# Patient Record
Sex: Female | Born: 1970
Health system: Southern US, Community
[De-identification: ages and names within clinical notes are randomized; demographics above are authoritative.]

## PROBLEM LIST (undated history)

## (undated) DIAGNOSIS — F419 Anxiety disorder, unspecified: Secondary | ICD-10-CM

## (undated) DIAGNOSIS — I10 Essential (primary) hypertension: Secondary | ICD-10-CM

## (undated) DIAGNOSIS — F329 Major depressive disorder, single episode, unspecified: Secondary | ICD-10-CM

## (undated) DIAGNOSIS — Z973 Presence of spectacles and contact lenses: Secondary | ICD-10-CM

## (undated) DIAGNOSIS — E785 Hyperlipidemia, unspecified: Secondary | ICD-10-CM

## (undated) DIAGNOSIS — K219 Gastro-esophageal reflux disease without esophagitis: Secondary | ICD-10-CM

## (undated) DIAGNOSIS — E119 Type 2 diabetes mellitus without complications: Secondary | ICD-10-CM

## (undated) DIAGNOSIS — J45909 Unspecified asthma, uncomplicated: Secondary | ICD-10-CM

## (undated) DIAGNOSIS — F32A Depression, unspecified: Secondary | ICD-10-CM

## (undated) DIAGNOSIS — J189 Pneumonia, unspecified organism: Secondary | ICD-10-CM

## (undated) DIAGNOSIS — E213 Hyperparathyroidism, unspecified: Secondary | ICD-10-CM

## (undated) HISTORY — PX: UMBILICAL HERNIA REPAIR: SHX196

## (undated) HISTORY — PX: CERVICAL CONE BIOPSY: SUR198

## (undated) HISTORY — DX: Major depressive disorder, single episode, unspecified: F32.9

## (undated) HISTORY — DX: Essential (primary) hypertension: I10

## (undated) HISTORY — DX: Anxiety disorder, unspecified: F41.9

## (undated) HISTORY — DX: Depression, unspecified: F32.A

## (undated) HISTORY — PX: TUBAL LIGATION: SHX77

## (undated) HISTORY — DX: Hyperlipidemia, unspecified: E78.5

## (undated) HISTORY — PX: WISDOM TOOTH EXTRACTION: SHX21

## (undated) HISTORY — PX: HERNIA REPAIR: SHX51

---

## 1997-12-01 HISTORY — PX: PARATHYROIDECTOMY: SHX19

## 1998-03-13 ENCOUNTER — Ambulatory Visit (HOSPITAL_COMMUNITY): Admission: RE | Admit: 1998-03-13 | Discharge: 1998-03-13 | Payer: Self-pay | Admitting: Surgery

## 1999-12-24 ENCOUNTER — Other Ambulatory Visit: Admission: RE | Admit: 1999-12-24 | Discharge: 1999-12-24 | Payer: Self-pay | Admitting: Gynecology

## 2000-01-24 ENCOUNTER — Other Ambulatory Visit: Admission: RE | Admit: 2000-01-24 | Discharge: 2000-01-24 | Payer: Self-pay | Admitting: Gynecology

## 2000-02-17 ENCOUNTER — Other Ambulatory Visit: Admission: RE | Admit: 2000-02-17 | Discharge: 2000-02-17 | Payer: Self-pay | Admitting: Gynecology

## 2000-08-07 ENCOUNTER — Encounter: Admission: RE | Admit: 2000-08-07 | Discharge: 2000-08-07 | Payer: Self-pay | Admitting: Family Medicine

## 2000-09-09 ENCOUNTER — Encounter: Admission: RE | Admit: 2000-09-09 | Discharge: 2000-09-09 | Payer: Self-pay | Admitting: Sports Medicine

## 2000-11-04 ENCOUNTER — Encounter: Admission: RE | Admit: 2000-11-04 | Discharge: 2000-11-04 | Payer: Self-pay | Admitting: Family Medicine

## 2000-12-23 ENCOUNTER — Other Ambulatory Visit: Admission: RE | Admit: 2000-12-23 | Discharge: 2000-12-23 | Payer: Self-pay | Admitting: Gynecology

## 2001-06-30 ENCOUNTER — Other Ambulatory Visit: Admission: RE | Admit: 2001-06-30 | Discharge: 2001-06-30 | Payer: Self-pay | Admitting: Gynecology

## 2001-07-27 ENCOUNTER — Other Ambulatory Visit: Admission: RE | Admit: 2001-07-27 | Discharge: 2001-07-27 | Payer: Self-pay | Admitting: Gynecology

## 2001-08-25 ENCOUNTER — Encounter: Admission: RE | Admit: 2001-08-25 | Discharge: 2001-08-25 | Payer: Self-pay | Admitting: Family Medicine

## 2001-09-08 ENCOUNTER — Encounter: Admission: RE | Admit: 2001-09-08 | Discharge: 2001-09-08 | Payer: Self-pay | Admitting: Family Medicine

## 2001-09-10 ENCOUNTER — Ambulatory Visit (HOSPITAL_COMMUNITY): Admission: RE | Admit: 2001-09-10 | Discharge: 2001-09-10 | Payer: Self-pay | Admitting: Obstetrics and Gynecology

## 2001-09-10 ENCOUNTER — Encounter (INDEPENDENT_AMBULATORY_CARE_PROVIDER_SITE_OTHER): Payer: Self-pay | Admitting: *Deleted

## 2001-09-15 ENCOUNTER — Encounter: Admission: RE | Admit: 2001-09-15 | Discharge: 2001-09-15 | Payer: Self-pay | Admitting: Family Medicine

## 2001-10-27 ENCOUNTER — Encounter: Admission: RE | Admit: 2001-10-27 | Discharge: 2001-10-27 | Payer: Self-pay | Admitting: Family Medicine

## 2001-11-03 ENCOUNTER — Encounter: Admission: RE | Admit: 2001-11-03 | Discharge: 2001-11-03 | Payer: Self-pay | Admitting: Family Medicine

## 2002-01-26 ENCOUNTER — Other Ambulatory Visit: Admission: RE | Admit: 2002-01-26 | Discharge: 2002-01-26 | Payer: Self-pay | Admitting: Obstetrics and Gynecology

## 2002-09-29 ENCOUNTER — Other Ambulatory Visit: Admission: RE | Admit: 2002-09-29 | Discharge: 2002-09-29 | Payer: Self-pay | Admitting: Obstetrics and Gynecology

## 2003-04-18 ENCOUNTER — Other Ambulatory Visit: Admission: RE | Admit: 2003-04-18 | Discharge: 2003-04-18 | Payer: Self-pay | Admitting: Obstetrics and Gynecology

## 2003-05-15 ENCOUNTER — Other Ambulatory Visit: Admission: RE | Admit: 2003-05-15 | Discharge: 2003-05-15 | Payer: Self-pay | Admitting: Obstetrics and Gynecology

## 2003-08-11 ENCOUNTER — Inpatient Hospital Stay (HOSPITAL_COMMUNITY): Admission: AD | Admit: 2003-08-11 | Discharge: 2003-08-12 | Payer: Self-pay | Admitting: Obstetrics and Gynecology

## 2003-12-05 ENCOUNTER — Inpatient Hospital Stay (HOSPITAL_COMMUNITY): Admission: AD | Admit: 2003-12-05 | Discharge: 2003-12-09 | Payer: Self-pay | Admitting: Obstetrics and Gynecology

## 2003-12-06 ENCOUNTER — Encounter (INDEPENDENT_AMBULATORY_CARE_PROVIDER_SITE_OTHER): Payer: Self-pay | Admitting: Specialist

## 2003-12-11 ENCOUNTER — Encounter: Admission: RE | Admit: 2003-12-11 | Discharge: 2004-01-10 | Payer: Self-pay | Admitting: Obstetrics and Gynecology

## 2004-01-11 ENCOUNTER — Encounter: Admission: RE | Admit: 2004-01-11 | Discharge: 2004-02-10 | Payer: Self-pay | Admitting: Obstetrics and Gynecology

## 2004-01-15 ENCOUNTER — Other Ambulatory Visit: Admission: RE | Admit: 2004-01-15 | Discharge: 2004-01-15 | Payer: Self-pay | Admitting: Obstetrics and Gynecology

## 2006-02-09 ENCOUNTER — Encounter: Admission: RE | Admit: 2006-02-09 | Discharge: 2006-02-09 | Payer: Self-pay | Admitting: Family Medicine

## 2006-08-24 ENCOUNTER — Ambulatory Visit: Payer: Self-pay | Admitting: Family Medicine

## 2006-09-02 ENCOUNTER — Ambulatory Visit: Payer: Self-pay | Admitting: Family Medicine

## 2007-03-18 DIAGNOSIS — F329 Major depressive disorder, single episode, unspecified: Secondary | ICD-10-CM

## 2007-03-18 DIAGNOSIS — E785 Hyperlipidemia, unspecified: Secondary | ICD-10-CM | POA: Insufficient documentation

## 2007-03-18 DIAGNOSIS — I10 Essential (primary) hypertension: Secondary | ICD-10-CM

## 2007-07-29 ENCOUNTER — Telehealth (INDEPENDENT_AMBULATORY_CARE_PROVIDER_SITE_OTHER): Payer: Self-pay | Admitting: *Deleted

## 2007-07-30 ENCOUNTER — Ambulatory Visit: Payer: Self-pay | Admitting: Family Medicine

## 2007-08-03 ENCOUNTER — Telehealth (INDEPENDENT_AMBULATORY_CARE_PROVIDER_SITE_OTHER): Payer: Self-pay | Admitting: *Deleted

## 2007-08-06 ENCOUNTER — Ambulatory Visit: Payer: Self-pay | Admitting: Family Medicine

## 2007-09-27 ENCOUNTER — Ambulatory Visit: Payer: Self-pay | Admitting: Family Medicine

## 2007-09-27 DIAGNOSIS — F5102 Adjustment insomnia: Secondary | ICD-10-CM

## 2007-09-27 DIAGNOSIS — F411 Generalized anxiety disorder: Secondary | ICD-10-CM

## 2007-11-02 ENCOUNTER — Ambulatory Visit: Payer: Self-pay | Admitting: Family Medicine

## 2007-11-02 DIAGNOSIS — J069 Acute upper respiratory infection, unspecified: Secondary | ICD-10-CM | POA: Insufficient documentation

## 2007-11-02 LAB — CONVERTED CEMR LAB
ALT: 13 units/L (ref 0–35)
AST: 22 units/L (ref 0–37)
Cholesterol: 157 mg/dL (ref 0–200)
Creatinine, Ser: 0.7 mg/dL (ref 0.4–1.2)
GFR calc Af Amer: 123 mL/min
GFR calc non Af Amer: 102 mL/min
LDL Cholesterol: 88 mg/dL (ref 0–99)
Sodium: 139 meq/L (ref 135–145)

## 2007-11-03 ENCOUNTER — Telehealth (INDEPENDENT_AMBULATORY_CARE_PROVIDER_SITE_OTHER): Payer: Self-pay | Admitting: *Deleted

## 2007-11-04 ENCOUNTER — Telehealth (INDEPENDENT_AMBULATORY_CARE_PROVIDER_SITE_OTHER): Payer: Self-pay | Admitting: *Deleted

## 2008-01-03 ENCOUNTER — Ambulatory Visit: Payer: Self-pay | Admitting: Family Medicine

## 2008-02-02 ENCOUNTER — Ambulatory Visit: Payer: Self-pay | Admitting: Family Medicine

## 2008-02-02 DIAGNOSIS — L600 Ingrowing nail: Secondary | ICD-10-CM | POA: Insufficient documentation

## 2008-02-07 ENCOUNTER — Encounter (INDEPENDENT_AMBULATORY_CARE_PROVIDER_SITE_OTHER): Payer: Self-pay | Admitting: *Deleted

## 2008-02-14 ENCOUNTER — Encounter: Payer: Self-pay | Admitting: Internal Medicine

## 2008-03-23 ENCOUNTER — Ambulatory Visit: Payer: Self-pay | Admitting: Family Medicine

## 2008-06-10 ENCOUNTER — Inpatient Hospital Stay (HOSPITAL_COMMUNITY): Admission: AD | Admit: 2008-06-10 | Discharge: 2008-06-10 | Payer: Self-pay | Admitting: *Deleted

## 2008-06-16 ENCOUNTER — Observation Stay (HOSPITAL_COMMUNITY): Admission: AD | Admit: 2008-06-16 | Discharge: 2008-06-16 | Payer: Self-pay | Admitting: Obstetrics and Gynecology

## 2008-12-07 ENCOUNTER — Inpatient Hospital Stay (HOSPITAL_COMMUNITY): Admission: AD | Admit: 2008-12-07 | Discharge: 2008-12-07 | Payer: Self-pay | Admitting: Obstetrics and Gynecology

## 2009-01-13 ENCOUNTER — Inpatient Hospital Stay (HOSPITAL_COMMUNITY): Admission: AD | Admit: 2009-01-13 | Discharge: 2009-01-16 | Payer: Self-pay | Admitting: Obstetrics

## 2009-01-13 ENCOUNTER — Encounter (INDEPENDENT_AMBULATORY_CARE_PROVIDER_SITE_OTHER): Payer: Self-pay | Admitting: Obstetrics and Gynecology

## 2009-01-17 ENCOUNTER — Encounter: Admission: RE | Admit: 2009-01-17 | Discharge: 2009-02-08 | Payer: Self-pay | Admitting: Obstetrics and Gynecology

## 2009-02-21 ENCOUNTER — Telehealth (INDEPENDENT_AMBULATORY_CARE_PROVIDER_SITE_OTHER): Payer: Self-pay | Admitting: *Deleted

## 2010-12-31 NOTE — Letter (Signed)
Summary: [facility]   Imported By: Velora Heckler 02/17/2008 09:51:45  _____________________________________________________________________  External Attachment:    Type:   Image     Comment:   External Document

## 2011-03-17 LAB — COMPREHENSIVE METABOLIC PANEL
ALT: 12 U/L (ref 0–35)
AST: 18 U/L (ref 0–37)
Albumin: 2.6 g/dL — ABNORMAL LOW (ref 3.5–5.2)
BUN: 4 mg/dL — ABNORMAL LOW (ref 6–23)
CO2: 22 mEq/L (ref 19–32)
Calcium: 8.6 mg/dL (ref 8.4–10.5)
Creatinine, Ser: 0.37 mg/dL — ABNORMAL LOW (ref 0.4–1.2)
GFR calc non Af Amer: >60 mL/min (ref >60)
Potassium: 3.3 mEq/L — ABNORMAL LOW (ref 3.5–5.1)
Total Protein: 5.6 g/dL — ABNORMAL LOW (ref 6.0–8.3)

## 2011-03-17 LAB — URINALYSIS, ROUTINE W REFLEX MICROSCOPIC
Ketones, ur: NEGATIVE mg/dL
Nitrite: NEGATIVE
Protein, ur: NEGATIVE mg/dL
Urobilinogen, UA: 0.2 mg/dL (ref 0.0–1.0)

## 2011-03-17 LAB — CBC
HCT: 34.5 % — ABNORMAL LOW (ref 36.0–46.0)
Hemoglobin: 11.9 g/dL — ABNORMAL LOW (ref 12.0–15.0)
MCHC: 34.5 g/dL (ref 30.0–36.0)
MCV: 90.1 fL (ref 78.0–100.0)
RBC: 3.83 MIL/uL — ABNORMAL LOW (ref 3.87–5.11)
RDW: 12.5 % (ref 11.5–15.5)
WBC: 9.5 10*3/uL (ref 4.0–10.5)

## 2011-03-18 LAB — CBC
HCT: 31.4 % — ABNORMAL LOW (ref 36.0–46.0)
HCT: 37.8 % (ref 36.0–46.0)
Hemoglobin: 11 g/dL — ABNORMAL LOW (ref 12.0–15.0)
Hemoglobin: 13 g/dL (ref 12.0–15.0)
MCHC: 34.5 g/dL (ref 30.0–36.0)
MCHC: 34.9 g/dL (ref 30.0–36.0)
MCV: 90.7 fL (ref 78.0–100.0)
Platelets: 238 10*3/uL (ref 150–400)
RBC: 4.17 MIL/uL (ref 3.87–5.11)
RDW: 13 % (ref 11.5–15.5)
RDW: 13.1 % (ref 11.5–15.5)
WBC: 10.8 10*3/uL — ABNORMAL HIGH (ref 4.0–10.5)
WBC: 12 10*3/uL — ABNORMAL HIGH (ref 4.0–10.5)

## 2011-03-18 LAB — URINE MICROSCOPIC-ADD ON

## 2011-03-18 LAB — COMPREHENSIVE METABOLIC PANEL
ALT: 14 U/L (ref 0–35)
AST: 22 U/L (ref 0–37)
Albumin: 2.7 g/dL — ABNORMAL LOW (ref 3.5–5.2)
Alkaline Phosphatase: 91 U/L (ref 39–117)
BUN: 6 mg/dL (ref 6–23)
CO2: 20 mEq/L (ref 19–32)
Calcium: 9 mg/dL (ref 8.4–10.5)
Chloride: 106 mEq/L (ref 96–112)
Creatinine, Ser: 0.35 mg/dL — ABNORMAL LOW (ref 0.4–1.2)
GFR calc Af Amer: >60 mL/min (ref >60)
GFR calc non Af Amer: >60 mL/min (ref >60)
Glucose, Bld: 93 mg/dL (ref 70–99)
Potassium: 3.3 mEq/L — ABNORMAL LOW (ref 3.5–5.1)
Sodium: 135 mEq/L (ref 135–145)
Total Bilirubin: 0.5 mg/dL (ref 0.3–1.2)
Total Protein: 6.1 g/dL (ref 6.0–8.3)

## 2011-03-18 LAB — URINALYSIS, ROUTINE W REFLEX MICROSCOPIC
Bilirubin Urine: NEGATIVE
Glucose, UA: NEGATIVE mg/dL
Hgb urine dipstick: NEGATIVE
Ketones, ur: 15 mg/dL — AB
Nitrite: NEGATIVE
Protein, ur: NEGATIVE mg/dL
Specific Gravity, Urine: 1.015 (ref 1.005–1.030)
Urobilinogen, UA: 0.2 mg/dL (ref 0.0–1.0)
pH: 6 (ref 5.0–8.0)

## 2011-03-18 LAB — URIC ACID: Uric Acid, Serum: 4.3 mg/dL (ref 2.4–7.0)

## 2011-03-18 LAB — LACTATE DEHYDROGENASE: LDH: 120 U/L (ref 94–250)

## 2011-03-18 LAB — RPR: RPR Ser Ql: NONREACTIVE

## 2011-04-15 NOTE — Op Note (Signed)
NAME:  Stacy, Hull            ACCOUNT NO.:  [id number]   MEDICAL RECORD NO.:  [id number]          PATIENT TYPE:  INP   LOCATION:  8850                          FACILITY:  [facility]   PHYSICIAN:  Servando Salina, M.D.DATE OF BIRTH:  1971/07/21   DATE OF PROCEDURE:  01/13/2009  DATE OF DISCHARGE:                               OPERATIVE REPORT   PREOPERATIVE DIAGNOSES:  1. Early labor.  2. Previous cesarean section.  3. Term gestation.  4. Desires sterilization.   PROCEDURES:  1. Repeat cesarean section Kerr hysterotomy.  2. Modified Pomeroy tubal ligation.   POSTOPERATIVE DIAGNOSES:  1. Desires sterilization.  2. Previous cesarean section.  3. Term gestation, delivered  4. Early labor.   ANESTHESIA:  Spinal.   SURGEON:  Servando Salina, MD   ASSISTANT:  Artelia Laroche, CNM   PROCEDURE:  Under adequate spinal anesthesia, the patient was placed in  the supine position with a left lateral tilt.  She was sterilely prepped  and draped in the usual fashion.  Indwelling Foley catheter was  sterilely placed.  A 0.25% Marcaine was injected along the previous  Pfannenstiel skin incision.  Pfannenstiel skin incision was then made,  carried down to the rectus fascia.  Rectus fascia was opened  transversely.  The rectus fascia was then bluntly and sharply dissected  off the rectus muscle in superior and inferior fashion.  The parietal  peritoneum was opened bluntly.  At that time, it was then noted there  was some bladder adhesions as well as an omental adhesion to the  anterior abdominal wall.  Omental adhesion was in a loop and this was  lysed off the left aspect of the anterior abdominal wall followed by  lysis of the adhesions of the bladder, peritoneum to the uterus.  Once  that was performed, curvilinear low transverse uterine incision was then  made and extended with bandage scissors.  It was then noted that there  was meconium-stained amniotic sac.  The sac was then  ruptured.  Subsequent delivery of a live female from the left occiput transverse  position was accomplished.  Baby was bulb suctioned on the abdomen.  Cord was clamped and cut, the baby was transferred to the awaiting  pediatrician who assigned Apgars of 9 at 9 and 1 at 5 minutes.  The  placenta which was anterior, was spontaneous intact.  Uterine cavity was  cleaned of debris.  Uterine incision had no extension, it was closed in  two layers, 0 Monocryl for running locked stitch first layer, second  layer 0 Monocryl imbricating sutures.  Small bleeders were cauterized.  Attention was then turned to the fallopian tubes and ovaries.  Both  tubes and ovaries were normal bilaterally.  The midportion of both  fallopian tubes were grasped with a Babcock.  The underlying mesosalpinx  was opened with cautery.  The proximal and distal portion of both  segment of tube bilaterally was tied with 0 chromic suture x2 proximally  and distally, and intermediate section of fallopian tube was then  removed bilaterally.  The abdomen was then copiously irrigated and  suctioned debris.  Small  bleeders again cauterized and good hemostasis  was then noted.  The parietal peritoneum was closed with 2-0 Vicryl.  The rectus fascia was closed with 0 Vicryl x2.  The subcutaneous areas  irrigated, small bleeders were cauterized.  Interrupted 2-0 plain  sutures placed in the subcutaneous area.  The skin approximated using  Ethicon staples.   SPECIMENS:  Placenta was not sent.  Portion of right and left fallopian  tube sent to Pathology.   ESTIMATED BLOOD LOSS:  800 mL.   INTRAOPERATIVE FLUID:  2100 mL.   URINE OUTPUT:  300 mL of clear yellow urine.   Sponge and instrument counts x2 was correct.  Complications none.  Weight of the baby was 7 pounds 6 ounces.  The patient tolerated the  procedure well and was transferred to recovery room in stable condition.      Servando Salina, M.D.  Electronically  Signed     [place]/MEDQ  D:  01/14/2009  T:  01/14/2009  Job:  [id number]

## 2011-04-15 NOTE — H&P (Signed)
NAME:  Stacy Hull, Stacy Hull            ACCOUNT NO.:  [id number]   MEDICAL RECORD NO.:  [id number]          PATIENT TYPE:  MAT   LOCATION:  MATC                          FACILITY:  [facility]   PHYSICIAN:  Lovenia Kim, M.D.DATE OF BIRTH:  1971/06/22   DATE OF ADMISSION:  12/07/2008  DATE OF DISCHARGE:  12/07/2008                              HISTORY & PHYSICAL   CHIEF COMPLAINT:  Elevated blood pressure.   She is a 40 year old white female G2, P1 and history of hypertension,  history of preeclampsia with her first pregnancy, presenting now at 45  plus weeks' gestation with reportedly a wrist cuff blood pressure workup  180/113 with occasional dizziness, but no headache, visual symptoms, or  epigastric pain.  She had a previous history of preeclampsia during her  first pregnancy.  She is currently taking Aldomet.  She has allergies to  ERYTHROMYCIN.  She has a personal history of cone biopsy, history of  HPV, history of parathyroidectomy, hypertension, hyperlipidemia, C-  section.  She is a nonsmoker, nondrinker.  She denies domestic or  physical violence.  She is a Education officer, museum.   Medications include Zofran, Lexapro, and methyldopa 250 mg twice a day.   She has a family history of diabetes, heart disease, hypertension,  cervical cancer, leukemia, stroke, and diabetes.   Prenatal course as noted.   PHYSICAL EXAMINATION:  GENERAL:  She is a well-developed, well-nourished  white female in no acute distress.  VITAL SIGNS:  Blood pressures ranging 110-130 over 60-80 since  admission.  HEENT:  Normal.  LUNGS:  Clear.  HEART:  Regular rhythm.  ABDOMEN:  Soft, gravid, nontender.  No right upper quadrant tenderness,  no CVA tenderness.  DTRs are 2 to 3+.  No evidence of clonus with trace  pretibial edema noted.  PELVIC:  Deferred.  NEUROLOGIC:  Nonfocal.   NST is reactive.  No current tractions noted.  Urinalysis is negative.  CBC is normal and CMP is pending.   IMPRESSION:  1. A  32-week OB.  2. History of hypertension with questionable exacerbation.  No      evidence of preeclampsia, normal blood pressures upon observation      here at [place]:  Discharge home pending lab work.  Continue Aldomet.  Follow up in  the office as scheduled.  Pre-eclamptic warnings given.      Lovenia Kim, M.D.  Electronically Signed     RJT/MEDQ  D:  12/07/2008  T:  12/08/2008  Job:  [id number]

## 2011-04-18 NOTE — Op Note (Signed)
[facility]  Patient:    Stacy Hull, Stacy Hull Visit Number: [id number] MRN: [id number]          Service Type: DSU Location: [facility] Attending Physician:  Servando Salina Dictated by:   Bobette Mo. Garwin Brothers, M.D. Proc. Date: 09/10/01 Admit Date:  09/10/2001                             Operative Report  PREOPERATIVE DIAGNOSIS:       Recurrent AGUS Pap smears.  POSTOPERATIVE DIAGNOSIS:      Recurrent AGUS Pap smears pending final                               pathology.  OPERATION:                    1. Colposcopy.                               2. Cold knife conization.                               3. Endocervical curetting.  SURGEON:                      Sheronette A. Garwin Brothers, M.D.  ANESTHESIA:                   MAC; paracervical block.  ESTIMATED BLOOD LOSS:         Minimal.  SPECIMENS:                    Cone biopsy and endocervical curettings.  COMPLICATIONS:                None.  INDICATIONS:                  This is a 40 year old G93, married white female on continuous birth control pills who presents for cold knife conization secondary to recurrent atypical glandular cells on her Pap smear with a negative colposcopy and endocervical curetting. The patient was previously followed by another gynecologist in town who had performed a colposcopy a year previously for a AGUS Pap smear and did not find a correlation. The patient was followed with Pap smears and then had recurrence of her AGUS Pap again. The patient sought attention elsewhere with regards to the management of her care. The risks and benefits of the planned conization was reviewed with the patient, specifically distortion of the cervical anatomy, cervical incompetence, cervical stenosis, bleeding, infection, possible need for the same procedure to be reperformed due to additional involvement extending to the margins of her specimen. Consent was signed and the patient  was transferred to the operating room.  DESCRIPTION OF PROCEDURE:            Under adequate monitored anesthesia, the patient was placed in the dorsal lithotomy position. She was externally prepped and draped in the usual sterile fashion. The patients bladder was catheterized. A bivalve speculum was placed in the vagina and 21 cc of 1% Nesacaine was injected paracervically at 3:00 and 9:00. #0 Vicryl figure-of-eight sutures were placed at 3:00 and 9:00 to incorporate the cervical branch of the uterine vessels. 4% acetic acid was placed on the cervix in  the vagina and a colposcopy was performed. No acetowhite epithelium, abnormal vessels, punctations or mosaic was seen. The cervix was then circumferentially injected with 1% lidocaine with epinephrine. Lugols solution was placed. A scalpel was then used to outline the periphery of the cone and Mayo scissors was then used to remove the base of the cone specimen. Endocervical curetting was then performed. The base was fulgurated. Monsels solution was applied and Surgicel placed in the cone bed area. The specimen was tagged at 12:00. Good hemostasis was noted. The sutures which had been placed at 3:00 and 9:00 were then tied down further and then all instruments removed from the vagina.  Specimens were cone biopsy and endocervical curettings. Estimated blood loss was minimal. Complications none. The patient tolerated the procedure well and was transferred to the recovery room in stable condition. Dictated by:   Bobette Mo. Garwin Brothers, M.D. Attending Physician:  Servando Salina DD:  09/10/01 TD:  09/11/01 Job: [id number]

## 2011-04-18 NOTE — Op Note (Signed)
NAME:  Stacy Hull, Stacy Hull                      ACCOUNT NO.:  [id number]   MEDICAL RECORD NO.:  [id number]                   PATIENT TYPE:  INP   LOCATION:  9125                                 FACILITY:  [facility]   PHYSICIAN:  Servando Salina, M.D.            DATE OF BIRTH:  [birth year]   DATE OF PROCEDURE:  12/06/2003  DATE OF DISCHARGE:                                 OPERATIVE REPORT   PREOPERATIVE DIAGNOSES:  1. Arrest of descent.  2. Intrauterine gestation at 38 weeks.  3. Chronic hypertension.   PROCEDURE:  Primary cesarean section, low transverse uterine incision.   POSTOPERATIVE DIAGNOSES:  1. Arrest of descent in direct occiput posterior presentation.  2. Intrauterine gestation of 38 weeks.  3. Chronic hypertension.   ANESTHESIA:  Epidural.   SURGEON:  Servando Salina, M.D.   INDICATIONS:  This is a 40 year old gravida 1, para 0 female at term, with  worsening chronic hypertension; admitted on December 05, 2003 for induction of  labor.  Her induction was started with Pitocin augmentation as well as  artificial rupture of membranes.  Light meconium was noted at the time.  An  intrauterine pressure catheter was placed and amnioinfusion started.  The  patient progressed to full dilatation and pushed for two hours; however, on  examination she had vertex that was wedged in the pelvis and at 0 station.  Prior examination had confirmed a right occiput posterior presentation.  Given the findings, the patient was given the option of proceeding with a  primary cesarean section versus continuing to push for an additional time.  The patient opted for primary cesarean section. The risks and benefit of the  procedure was reviewed.  Consent had been signed.  The patient was  transferred to the operating room.   DESCRIPTION OF PROCEDURE:  Under adequate epidural anesthesia, the patient  was placed in the supine position with the left lateral tilt.  She was  sterilely prepped and  draped in the usual fashion, and an indwelling Foley  catheter was already in place.  Internal scalp electrode was utilized for  fetal heart monitoring, and was subsequently removed prior to prepping the  patient.  A marking pen was used to outline the planned Pfannenstiel skin  incision.  Then 6 cc of 0.25% Marcaine was injected along the planned  incision line.  The Pfannenstiel skin incision was then made and carried  down to the rectus fascia, using scalpel.  The rectus fascia was incised in  the midline and extended bilaterally using Bovie cautery.  The rectus fascia  was bluntly and with cautery dissected off the rectus muscles superiorly and  inferiorly.  The rectus muscles split in the midline.  The parietoperitoneum  was developed.  The bladder was then bluntly dissected off the lower uterine  segment and displaced inferiorly using a bladder retractor.  A curvilinear  low transverse uterine incision was then made, and extended bilaterally  using bandage  scissors.  On extension of the incision there was a loop of  cord noted on the right aspect of the baby.  Subsequent delivery of a live  female from the direct occiput posterior presentation wedged in the pelvis was  accomplished without difficulty.  He was bulb suctioned in the abdomen. Cord  was clamped and cut.  The baby was transferred to the awaiting  pediatricians.  Apgars of 8 and 9 at one and five minutes.  The placenta,  which was cornual state and was anterior and manually removed.  The uterine  cavity was cleaned of debris, with no intracavitary lesions noted.  Uterine  incision had no extension.  The uterine incision was then closed in two  layers; the first layer is a running locked stitch of 0 Monocryl, the second  layer is embrocating using 0 Monocryl suture.  Bleeding along about 1.5 inch  area in the mid aspect of the incision resulted in 0 Monocryl being oversewn  in that area; good hemostasis subsequently noted.   Additional small bleeders  were then cauterized.  The abdomen was copiously irrigated and suctioned of  debris.  Normal tubes and ovaries are noted bilaterally.  The  parietoperitoneum was now closed.  The rectus fascia was inspected and small  bleeders cauterized.  The rectus fascia was then closed with 0 Vicryl x2.  The subcutaneous area was irrigated, suctioned and cauterized of small  bleeders.  The skin was approximated using Ethicon staples.   SPECIMENS:  Placenta sent to pathology.   ESTIMATED BLOOD LOSS:  500 cc.   INTRAOPERATIVE FLUID:  1800 cc Crystalloid.   URINE OUTPUT:  200 cc clear yellow urine.   COMPLICATIONS:  None.   DISPOSITION:  Sponge and instrument count x2 was correct.  The patient  tolerated the procedure well and was transferred to the recovery room in  stable condition.                                               Servando Salina, M.D.    [place]/MEDQ  D:  12/06/2003  T:  12/06/2003  Job:  [id number]

## 2011-04-18 NOTE — Discharge Summary (Signed)
NAME:  Stacy Hull, Stacy Hull            ACCOUNT NO.:  [id number]   MEDICAL RECORD NO.:  [id number]          PATIENT TYPE:  INP   LOCATION:  3013                          FACILITY:  [facility]   PHYSICIAN:  Servando Salina, M.D.DATE OF BIRTH:  Jun 23, 1971   DATE OF ADMISSION:  01/13/2009  DATE OF DISCHARGE:  01/16/2009                               DISCHARGE SUMMARY   ADMISSION DIAGNOSES:  Early labor, term gestation,  previous cesarean  section, desires sterilization, chronic hypertension.   DISCHARGE DIAGNOSES:  Term gestation, delivered, previous cesarean  section, early labor, desires sterilization, chronic hypertension.   PROCEDURE:  Repeat cesarean section, modified Pomeroy tubal ligation.   HISTORY OF PRESENT ILLNESS:  A 40 year old gravida 2, para 1-0-0-1  female at 68 weeks with chronic hypertension on Aldomet, controlled at a  previous cesarean section, scheduled for repeat C-section and who  presented in early labor.  The patient declined attempt of vaginal  delivery.  Her prenatal course has been notable for group B strep that  was positive.  The patient had abnormal 1-hour glucose tolerance test,  but a normal 3-hour testing.   HOSPITAL COURSE:  The patient was evaluated in maternity admissions.  At  that time of her presentation, blood pressure was notable to 152/94,  161/112, 149/99.  Her exam was notable for 1+ edema.  Deep tendon  reflexes were 2+.  Her cervical exam was 3, 90 and -2 with a bulging bag  of water.  Her tracing was reactive.  [place] labs were obtained that were  normal.  The patient was taken to the operating room to undergo her  repeat cesarean section as planned.  She reiterated the desire for  sterilization.  The patient underwent surgery, live female was born.  Apgars of 9 and 9, 7 pounds 6 ounces, omental adhesions that were  present and bladder adhesions were lysed.  Normal tubes and ovaries.  Midportion of both fallopian tubes were removed and confirmed  by  Pathology.  Her postoperative course was unremarkable.  She was  continued on her Aldomet.  Her CBC on postop day #1 showed a hemoglobin  of 11, hematocrit 31.4, white count of 12.0, platelet count 20,000.  The  patient did not require any additional blood pressure medications.  She  was also continued on her Lexapro for depression, which has been  controlled.  By postop day #3, she was tolerating a regular diet.  Incision had no evidence of infection.  She was deemed well to go home.  Disposition is home.  Condition stable.   DISCHARGE MEDICATIONS:  1. Percocet 5/325 mg 1-2 tablets every 4-6 hours p.r.n. pain.  2. Lexapro 10 mg p.o. daily.  3. Aldomet 250 mg p.o. b.i.d.  4. Motrin 100 mg 1 every 8 hours p.r.n. pain.  5. Prenatal vitamins 1 p.o. daily.   DISCHARGE INSTRUCTIONS:  Per the postpartum booklet given.  Followup  appointment at [facility] on January 19, 2009, for blood pressure  check and staple removal and 6 weeks for a postpartum appointment.      Servando Salina, M.D.  Electronically Signed  [place]/MEDQ  D:  02/10/2009  T:  02/11/2009  Job:  [id number]

## 2011-04-18 NOTE — Discharge Summary (Signed)
NAME:  LYRIS, HITCHMAN                      ACCOUNT NO.:  [id number]   MEDICAL RECORD NO.:  [id number]                   PATIENT TYPE:  INP   LOCATION:  9125                                 FACILITY:  [facility]   PHYSICIAN:  Servando Salina, M.D.            DATE OF BIRTH:  10-12-1971   DATE OF ADMISSION:  12/05/2003  DATE OF DISCHARGE:  12/09/2003                                 DISCHARGE SUMMARY   ADMISSION DIAGNOSES:  1. Chronic hypertension.  2. Intrauterine gestation at 38 weeks.   DISCHARGE DIAGNOSES:  1. Term gestation, delivered.  2. Chronic hypertension.  3. Arrest of descent.  4. Status post a primary cesarean section.   PROCEDURE:  Primary cesarean section.   HISTORY OF PRESENT ILLNESS:  A 40 year old gravida 1 para 0 female at 37+  weeks gestation admitted for induction of labor secondary to elevated blood  pressures.  Her labs had not shown superimposed preeclampsia.   HOSPITAL COURSE:  The patient was admitted to [place]  induction was started.  Artificial rupture of membranes was performed on  December 05, 2003.  Light meconium was noted.  [place] labs had been obtained and  were normal.  Amnioinfusion was started.  The patient subsequently  progressed to full dilatation, pushed for 2 hours without much descent  beyond 0 station with a wedged-in vertex.  Given the findings the patient  opted to proceed with a primary cesarean section rather than continue to  push.  The patient underwent a primary cesarean section on December 06, 2003,  live female, 7 pounds 5 ounces, direct occiput posterior presentation, Apgars  of 8 and 9.  Normal tubes and ovaries were noted.  Cord was adjacent to the  left upper body of the baby.  The patient had an uncomplicated postoperative  course.  She did not require any blood pressure medication during her  hospitalization.  She was tolerating a regular diet and passing flatus, and  on postoperative day #3 after remaining  without any evidence of infection,  she was deemed well to be discharged home.  Her CBC on postoperative day #1  showed a hemoglobin 11.1, white count 12.6, hematocrit 31.2.   DISPOSITION:  Home.   CONDITION:  Stable.   DISCHARGE MEDICATIONS:  1. Percocet one to two tablets q.3-4h. p.r.n. pain.  2. Motrin 800 mg one p.o. q.6-8h. p.r.n. pain.  3. Prenatal vitamins one p.o. daily.   FOLLOW-UP:  Follow-up appointment at [facility] in 4 weeks for  postpartum care.   DISCHARGE INSTRUCTIONS:  1. Call for temperature greater than or equal to 100.4.  2. Nothing per vagina for 4-6 weeks.  3. No heavy lifting or driving for 2 weeks.  4. Call for [facility] warning signs, soaking a regular pad every hour or more     frequently, severe abdominal pain, nausea or vomiting, increased incision     pain, redness or drainage from the incision site.  Servando Salina, M.D.    [place]/MEDQ  D:  01/19/2004  T:  01/20/2004  Job:  [id number]

## 2011-04-18 NOTE — H&P (Signed)
NAME:  Stacy Hull, Stacy Hull                      ACCOUNT NO.:  [id number]   MEDICAL RECORD NO.:  [id number]                   PATIENT TYPE:  MAT   LOCATION:  MATC                                 FACILITY:  [place] [facility]   PHYSICIAN:  Lovenia Kim, M.D.             DATE OF BIRTH:  Jun 25, 1971   DATE OF ADMISSION:  12/05/2003  DATE OF DISCHARGE:                                HISTORY & PHYSICAL   CHIEF COMPLAINT:  Elevated blood pressure.   HISTORY OF PRESENT ILLNESS:  The patient is a 40 year old white female, G1,  P0, EDD of December 22, 2003, at 37+ weeks, who presents with elevated blood  pressures and office visit for induction.   ALLERGIES:  ERYTHROMYCIN.   MEDICATIONS:  Albuterol p.r.n. and prenatal vitamins.   MEDICAL HISTORY:  1. Remarkable for abnormal Pap smear with cone biopsy in 2002.  2. History of HPV.  3. History of asthma.  4. History of parathyroidectomy in 1999.  5. History of UTI.   FAMILY HISTORY:  Manic depression, leukemia, diabetes, heart disease, and  myocardial infarction.   PRENATAL LABORATORY DATA:  Blood type O positive, rubella immune, hepatitis  negative, HIV nonreactive.   PRENATAL COURSE:  Complicated by elevation of blood pressure, being followed  since the late second, early third trimester.   PHYSICAL EXAMINATION:  GENERAL:  She is a well-developed, well-nourished,  white female in no acute distress.  HEENT:  Normal.  LUNGS:  Clear.  HEART:  Regular rhythm.  ABDOMEN:  Soft, gravid, nontender.  Estimated fetal weight 8 pounds.  CERVICAL:  Per Dr. Garwin Brothers, 1 cm, 70% vertex, and -1.  EXTREMITIES:  DTRs 3+.  No evidence of clonus, with trace pretibial edema  noted.  NEUROLOGIC:  Nonfocal.   PIH labs reveal normal liver function tests with creatinine of 0.6, LDH 131,  uric acid 3.6, negative proteinuria on urinalysis and a normal CBC with a  platelet count of 281.  Fetal heart rate is reactive.  Irregular  contractions are noted.   IMPRESSION:   Gestational hypertension at term.  No evidence of preeclampsia.   PLAN:  1. Proceed with Pitocin induction.  2. Watch blood pressure closely.  3. Need for magnesium to be assessed intrapartum and postpartum.                                               Lovenia Kim, M.D.    RJT/MEDQ  D:  12/05/2003  T:  12/05/2003  Job:  [id number]

## 2011-08-28 LAB — URINALYSIS, ROUTINE W REFLEX MICROSCOPIC
Glucose, UA: NEGATIVE
Hgb urine dipstick: NEGATIVE
Hgb urine dipstick: NEGATIVE
Protein, ur: NEGATIVE
Specific Gravity, Urine: 1.01
Specific Gravity, Urine: 1.025
Urobilinogen, UA: 0.2

## 2011-08-29 LAB — URINALYSIS, ROUTINE W REFLEX MICROSCOPIC
Bilirubin Urine: NEGATIVE
Ketones, ur: 15 — AB
Nitrite: NEGATIVE
Protein, ur: NEGATIVE
Urobilinogen, UA: 0.2

## 2011-08-29 LAB — HEPATITIS PANEL, ACUTE
HCV Ab: NEGATIVE
Hep A IgM: NEGATIVE
Hep B C IgM: NEGATIVE
Hepatitis B Surface Ag: NEGATIVE

## 2011-08-29 LAB — TSH: TSH: 0.526

## 2012-01-20 ENCOUNTER — Other Ambulatory Visit: Payer: Self-pay | Admitting: Physician Assistant

## 2012-01-20 ENCOUNTER — Other Ambulatory Visit: Payer: Self-pay | Admitting: Family Medicine

## 2012-01-21 NOTE — Progress Notes (Unsigned)
Called in alpralolam 0.5 Rx into [facility] [address] Subjective:    Patient ID: Stacy Hull, female    DOB: 01/10/1971, 41 y.o.   MRN: [id number]  HPI    Review of Systems     Objective:   Physical Exam        Assessment & Plan:

## 2012-01-21 NOTE — Telephone Encounter (Signed)
I meant to ask that you phone this is, but forgot to make the change, so it printed.  Please call in the alprazolam as ordered, and shred the printed copy.  Thanks. csj

## 2012-01-21 NOTE — Telephone Encounter (Signed)
Rx called in 

## 2012-02-03 ENCOUNTER — Ambulatory Visit (INDEPENDENT_AMBULATORY_CARE_PROVIDER_SITE_OTHER): Payer: BC Managed Care – PPO | Admitting: Physician Assistant

## 2012-02-03 ENCOUNTER — Other Ambulatory Visit: Payer: Self-pay | Admitting: Physician Assistant

## 2012-02-03 VITALS — BP 114/76 | HR 78 | Temp 98.4°F | Resp 16 | Ht 65.25 in | Wt 193.0 lb

## 2012-02-03 DIAGNOSIS — J3489 Other specified disorders of nose and nasal sinuses: Secondary | ICD-10-CM

## 2012-02-03 DIAGNOSIS — R0981 Nasal congestion: Secondary | ICD-10-CM

## 2012-02-03 DIAGNOSIS — I10 Essential (primary) hypertension: Secondary | ICD-10-CM

## 2012-02-03 DIAGNOSIS — J029 Acute pharyngitis, unspecified: Secondary | ICD-10-CM

## 2012-02-03 DIAGNOSIS — R05 Cough: Secondary | ICD-10-CM

## 2012-02-03 MED ORDER — IPRATROPIUM BROMIDE 0.06 % NA SOLN: 2.0000 | Freq: Three times a day (TID) | NASAL | Status: DC

## 2012-02-03 MED ORDER — BENZONATATE 100 MG PO CAPS: 100.0000 mg | ORAL_CAPSULE | Freq: Three times a day (TID) | ORAL | Status: AC | PRN

## 2012-02-03 MED ORDER — LISINOPRIL-HYDROCHLOROTHIAZIDE 20-12.5 MG PO TABS: 1.0000 | ORAL_TABLET | Freq: Every day | ORAL | Status: DC

## 2012-02-03 MED ORDER — CEFDINIR 300 MG PO CAPS: 300.0000 mg | ORAL_CAPSULE | Freq: Two times a day (BID) | ORAL | Status: AC

## 2012-02-03 NOTE — Progress Notes (Signed)
Patient ID: Stacy Hull MRN: [id number], DOB: 01/24/1971, 41 y.o. Date of Encounter: 02/03/2012, 1:50 PM  Primary Physician: Garnet Koyanagi, DO, DO  Chief Complaint:  Chief Complaint  Patient presents with  . Sore Throat    yesterday - exposed to strep throat  . Cough  . Headache    HPI: 41 y.o. year old female presents with 2 day history of sore throat, cough, and sinus headache. Subjective fever and chills. Mild myalgias. Has been exposed to two co-workers with diagnosed strep pharyngitis. Her sore throat is her worst symptom. Hurts to swallow, but able to. Very mild congestion and cough. When she does cough it is non-productive.   She also requests a refill of her lisinopril/HCTZ 20/12.5 mg. She has an appointment with Dr. Carlota Raspberry 02/13/12 for routine follow up and will be out of her medication before then. She is doing well with her current dose. No side effects.  No leg trauma, sedentary periods, h/o cancer, or tobacco use.  Past Medical History  Diagnosis Date  . Hyperlipidemia   . HTN (hypertension)   . Anxiety   . Allergic rhinitis      Home Meds: Prior to Admission medications   Medication Sig Start Date End Date Taking? Authorizing Provider  ALPRAZolam Duanne Moron) 0.5 MG tablet TAKE 1 TABLET BY MOUTH EVERY DAY 01/20/12  Yes Chelle Jeffery, PA-C  citalopram (CELEXA) 20 MG tablet TAKE ONE TABLET BY MOUTH DAILY 01/20/12  Yes Windell Hummingbird, PA-C  lisinopril-hydrochlorothiazide (PRINZIDE,ZESTORETIC) 20-12.5 MG per tablet Take 1 tablet by mouth daily.   Yes Historical Provider, MD    Allergies:  Allergies  Allergen Reactions  . Erythromycin     History   Social History  . Marital Status: Married    Spouse Name: N/A    Number of Children: N/A  . Years of Education: N/A   Occupational History  . Not on file.   Social History Main Topics  . Smoking status: Never Smoker   . Smokeless tobacco: Not on file  . Alcohol Use: Not on file  . Drug Use: Not on file  .  Sexually Active: Not on file   Other Topics Concern  . Not on file   Social History Narrative  . No narrative on file     Review of Systems: Constitutional: negative for chills, fever, night sweats or weight changes Cardiovascular: negative for chest pain or palpitations Respiratory: negative for hemoptysis, wheezing, or shortness of breath Abdominal: negative for abdominal pain, nausea, vomiting or diarrhea Dermatological: negative for rash Neurologic: negative for headache   Physical Exam: Blood pressure 114/76, pulse 78, temperature 98.4 F (36.9 C), temperature source Oral, resp. rate 16, height 5' 5.25" (1.657 m), weight 193 lb (87.544 kg), last menstrual period 01/27/2012., Body mass index is 31.87 kg/(m^2). General: Well developed, well nourished, in no acute distress. Head: Normocephalic, atraumatic, eyes without discharge, sclera non-icteric, nares are congested. Bilateral auditory canals clear, TM's are without perforation, pearly grey with reflective cone of light bilaterally. Serous effusion bilaterally behind TM's. Frontal sinus TTP bilaterally. Oral cavity moist, dentition normal. Posterior pharynx with post nasal drip and erythema. Mild exudate along left tonsil. No peritonsillar abscess. Neck: Supple. No thyromegaly. Full ROM. < 2 cm AC. Lungs: Clear bilaterally to auscultation without wheezes, rales, or rhonchi. Breathing is unlabored. Heart: RRR with S1 S2. No murmurs, rubs, or gallops appreciated. Msk:  Strength and tone normal for age. Extremities: No clubbing or cyanosis. No edema. Neuro: Alert and oriented  X 3. Moves all extremities spontaneously. CNII-XII grossly in tact. Psych:  Responds to questions appropriately with a normal affect.   Labs: Results for orders placed in visit on 02/03/12  POCT RAPID STREP A (OFFICE)      Component Value Range   Rapid Strep A Screen Negative  Negative    TC pending  ASSESSMENT AND PLAN:  41 y.o. year old female with  pharyngitis with known exposure to strep and hypertension. 1. Pharyngitis -Cover for strep pharyngitis -Omnicef 300 mg 1 po bid #20 no RF  -Atrovent NS 0.06% 2 sprays each nare bid prn #1 no RF -Tessalon Perles 100 mg 1 po tid prn cough #30 no RF  -New toothbrush  -Mucinex -Tylenol/Motrin prn -Rest/fluids -RTC precautions -RTC 3-5 days if no improvement  2. Hypertension -Refill Lisinopril/HCTZ 20/12.5 mg #30 1 po daily RF 1 -Has appointment with Dr. Carlota Raspberry 02/13/12 for routine follow up  Signed, Christell Faith, PA-C 02/03/2012 1:50 PM

## 2012-02-04 NOTE — Progress Notes (Signed)
Quick Note:  Await final results.  Christell Faith, PA-C 02/04/2012 7:22 PM ______

## 2012-02-05 LAB — CULTURE, GROUP A STREP: Organism ID, Bacteria: NORMAL

## 2012-02-13 ENCOUNTER — Ambulatory Visit: Payer: Self-pay | Admitting: Family Medicine

## 2012-02-24 ENCOUNTER — Other Ambulatory Visit: Payer: Self-pay | Admitting: Physician Assistant

## 2012-03-16 ENCOUNTER — Other Ambulatory Visit: Payer: Self-pay | Admitting: Physician Assistant

## 2012-04-30 ENCOUNTER — Other Ambulatory Visit: Payer: Self-pay | Admitting: Physician Assistant

## 2012-06-11 ENCOUNTER — Other Ambulatory Visit: Payer: Self-pay | Admitting: Physician Assistant

## 2012-06-15 ENCOUNTER — Other Ambulatory Visit: Payer: Self-pay | Admitting: Family Medicine

## 2012-06-15 MED ORDER — CITALOPRAM HYDROBROMIDE 20 MG PO TABS: 20.0000 mg | ORAL_TABLET | Freq: Every day | ORAL | Status: DC

## 2012-06-22 ENCOUNTER — Other Ambulatory Visit: Payer: Self-pay | Admitting: Physician Assistant

## 2012-07-21 ENCOUNTER — Other Ambulatory Visit: Payer: Self-pay | Admitting: Physician Assistant

## 2012-07-21 NOTE — Telephone Encounter (Signed)
#  15, 0 refill-- needs OV

## 2012-07-21 NOTE — Telephone Encounter (Signed)
Needs office visit.

## 2012-08-29 ENCOUNTER — Other Ambulatory Visit: Payer: Self-pay | Admitting: Physician Assistant

## 2012-09-23 ENCOUNTER — Other Ambulatory Visit: Payer: Self-pay | Admitting: Physician Assistant

## 2012-10-01 ENCOUNTER — Ambulatory Visit (INDEPENDENT_AMBULATORY_CARE_PROVIDER_SITE_OTHER): Payer: BC Managed Care – PPO | Admitting: Family Medicine

## 2012-10-01 ENCOUNTER — Encounter: Payer: Self-pay | Admitting: Family Medicine

## 2012-10-01 VITALS — BP 122/96 | HR 71 | Temp 98.4°F | Resp 16 | Ht 66.0 in | Wt 192.4 lb

## 2012-10-01 DIAGNOSIS — Z79899 Other long term (current) drug therapy: Secondary | ICD-10-CM

## 2012-10-01 DIAGNOSIS — F411 Generalized anxiety disorder: Secondary | ICD-10-CM

## 2012-10-01 DIAGNOSIS — I10 Essential (primary) hypertension: Secondary | ICD-10-CM

## 2012-10-01 MED ORDER — BENZONATATE 100 MG PO CAPS: 200.0000 mg | ORAL_CAPSULE | Freq: Three times a day (TID) | ORAL | Status: DC | PRN

## 2012-10-01 MED ORDER — CITALOPRAM HYDROBROMIDE 20 MG PO TABS: 20.0000 mg | ORAL_TABLET | Freq: Every day | ORAL | Status: DC

## 2012-10-01 MED ORDER — ALPRAZOLAM 0.5 MG PO TABS: 0.5000 mg | ORAL_TABLET | Freq: Every day | ORAL | Status: DC | PRN

## 2012-10-01 MED ORDER — LISINOPRIL-HYDROCHLOROTHIAZIDE 20-12.5 MG PO TABS: 1.0000 | ORAL_TABLET | Freq: Every day | ORAL | Status: DC

## 2012-10-02 LAB — BASIC METABOLIC PANEL
Chloride: 104 mEq/L (ref 96–112)
Creat: 0.5 mg/dL (ref 0.50–1.10)

## 2012-10-04 NOTE — Progress Notes (Signed)
  Subjective:    Patient ID: Stacy Hull, female    DOB: 08-Sep-1971, 41 y.o.   MRN: [id number]  HPI  Pt here for refill on her celexa and xanax.  Has a sig FHx of bipolar d/o which has been very stressful for her - her mother had a suicide attempt last Christmas.  She has been doing very well on celexa 20 and feels like she is in a much better place now but not ready to go off. Has been on antidepressants for about 8 yrs and celexa for about 4.  Uses prn xanax rarely - less than 1/2 the days.    She was diagnosed w/ bronchitis and covered w/ augmentin at a [facility] sev wks ago.  Is feeling better but still coughing and now w/ some stress incontinence so requests refill on tessalon pearles to use while at work.  Also needs refill on bp med.  Past Medical History  Diagnosis Date  . Hyperlipidemia   . HTN (hypertension)   . Anxiety   . 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  Review of Systems  Constitutional: Positive for fatigue. Negative for fever, chills, diaphoresis and appetite change.  Eyes: Negative for visual disturbance.  Respiratory: Positive for cough. Negative for shortness of breath.   Cardiovascular: Negative for chest pain, palpitations and leg swelling.  Genitourinary: Negative for decreased urine volume.  Neurological: Negative for syncope and headaches.  Hematological: Does not bruise/bleed easily.  Psychiatric/Behavioral: Positive for sleep disturbance and dysphoric mood.      BP 122/96  Pulse 71  Temp 98.4 F (36.9 C) (Oral)  Resp 16  Ht 5\' 6"  (1.676 m)  Wt 192 lb 6.4 oz (87.272 kg)  BMI 31.05 kg/m2  SpO2 97% Objective:   Physical Exam  Constitutional: She is oriented to person, place, and time. She appears well-developed and well-nourished. No distress.  HENT:  Head: Normocephalic and atraumatic.  Right Ear: External ear normal.  Left Ear: External ear normal.  Eyes: Conjunctivae normal are normal. No  scleral icterus.  Neck: Normal range of motion. Neck supple. No thyromegaly present.  Cardiovascular: Normal rate, regular rhythm, normal heart sounds and intact distal pulses.   Pulmonary/Chest: Effort normal and breath sounds normal. No respiratory distress.  Musculoskeletal: She exhibits no edema.  Lymphadenopathy:    She has no cervical adenopathy.  Neurological: She is alert and oriented to person, place, and time.  Skin: Skin is warm and dry. She is not diaphoretic. No erythema.  Psychiatric: She has a normal mood and affect. Her behavior is normal.          Assessment & Plan:   1. Encounter for long-term (current) use of other medications  Basic metabolic panel   2. HTN -  refill lisinopril/hctz. 3. Depression - cont citalopram w/ prn xanax. Pt will consider trying to wean off citalopram this summer - will see how she is doing. 4. Acute bronchitis - recovering, refill tessalon.

## 2012-11-17 ENCOUNTER — Other Ambulatory Visit: Payer: Self-pay | Admitting: Obstetrics and Gynecology

## 2012-11-20 ENCOUNTER — Encounter (HOSPITAL_COMMUNITY): Payer: Self-pay | Admitting: Pharmacist

## 2012-11-25 ENCOUNTER — Encounter (HOSPITAL_COMMUNITY): Payer: Self-pay

## 2012-11-25 ENCOUNTER — Encounter (HOSPITAL_COMMUNITY)
Admission: RE | Admit: 2012-11-25 | Discharge: 2012-11-25 | Disposition: A | Discharge status: Home / Self Care | Payer: BC Managed Care – PPO | Source: Ambulatory Visit | Attending: Obstetrics and Gynecology | Admitting: Obstetrics and Gynecology

## 2012-11-25 LAB — BASIC METABOLIC PANEL
CO2: 22 mEq/L (ref 19–32)
Calcium: 9.5 mg/dL (ref 8.4–10.5)
Creatinine, Ser: 0.55 mg/dL (ref 0.50–1.10)
GFR calc Af Amer: >90 mL/min (ref >90)
Sodium: 134 mEq/L — ABNORMAL LOW (ref 135–145)

## 2012-11-25 LAB — CBC
MCH: 32.4 pg (ref 26.0–34.0)
Platelets: 283 10*3/uL (ref 150–400)
RBC: 4.85 MIL/uL (ref 3.87–5.11)
RDW: 12.2 % (ref 11.5–15.5)
WBC: 8.5 10*3/uL (ref 4.0–10.5)

## 2012-11-25 NOTE — Patient Instructions (Addendum)
20 Stacy Hull  11/25/2012   Your procedure is scheduled on:  11/26/12  Enter through the Main Entrance of [facility] at 9 AM.  Pick up the phone at the desk and dial [phone].   Call this number if you have problems the morning of surgery: [phone]   Remember:   Do not eat food:After Midnight.  Do not drink clear liquids: After Midnight.  Take these medicines the morning of surgery with A SIP OF WATER: Celexa, Blood Pressure medication   Do not wear jewelry, make-up or nail polish.  Do not wear lotions, powders, or perfumes. You may wear deodorant.  Do not shave 48 hours prior to surgery.  Do not bring valuables to the hospital.  Contacts, dentures or bridgework may not be worn into surgery.  Leave suitcase in the car. After surgery it may be brought to your room.  For patients admitted to the hospital, checkout time is 11:00 AM the day of discharge.   Patients discharged the day of surgery will not be allowed to drive home.  Name and phone number of your driver: husband   [name]  Special Instructions: Shower using CHG 2 nights before surgery and the night before surgery.  If you shower the day of surgery use CHG.  Use special wash - you have one bottle of CHG for all showers.  You should use approximately 1/3 of the bottle for each shower.   Please read over the following fact sheets that you were given: Surgical Site Infection Prevention

## 2012-11-26 ENCOUNTER — Ambulatory Visit (HOSPITAL_COMMUNITY): Payer: BC Managed Care – PPO | Admitting: Anesthesiology

## 2012-11-26 ENCOUNTER — Encounter (HOSPITAL_COMMUNITY)
Admission: RE | Disposition: A | Discharge status: Home / Self Care | Payer: Self-pay | Source: Ambulatory Visit | Attending: Obstetrics and Gynecology

## 2012-11-26 ENCOUNTER — Encounter (HOSPITAL_COMMUNITY): Payer: Self-pay | Admitting: Anesthesiology

## 2012-11-26 ENCOUNTER — Ambulatory Visit (HOSPITAL_COMMUNITY)
Admission: RE | Admit: 2012-11-26 | Discharge: 2012-11-26 | Disposition: A | Discharge status: Home / Self Care | Payer: BC Managed Care – PPO | Source: Ambulatory Visit | Attending: Obstetrics and Gynecology | Admitting: Obstetrics and Gynecology

## 2012-11-26 DIAGNOSIS — N920 Excessive and frequent menstruation with regular cycle: Secondary | ICD-10-CM | POA: Insufficient documentation

## 2012-11-26 DIAGNOSIS — Z9889 Other specified postprocedural states: Secondary | ICD-10-CM

## 2012-11-26 DIAGNOSIS — Z01818 Encounter for other preprocedural examination: Secondary | ICD-10-CM | POA: Insufficient documentation

## 2012-11-26 DIAGNOSIS — Z01812 Encounter for preprocedural laboratory examination: Secondary | ICD-10-CM | POA: Insufficient documentation

## 2012-11-26 HISTORY — PX: HYSTEROSCOPY WITH NOVASURE: SHX5574

## 2012-11-26 SURGERY — HYSTEROSCOPY WITH NOVASURE
Anesthesia: General | Site: Uterus | Wound class: Clean Contaminated

## 2012-11-26 MED ORDER — LACTATED RINGERS IV SOLN
INTRAVENOUS | Status: DC | PRN
  Administered 2012-11-26: 500 mL via INTRAUTERINE

## 2012-11-26 MED ORDER — DEXAMETHASONE SODIUM PHOSPHATE 10 MG/ML IJ SOLN
INTRAMUSCULAR | Status: DC | PRN
  Administered 2012-11-26: 10 mg via INTRAVENOUS

## 2012-11-26 MED ORDER — KETOROLAC TROMETHAMINE 30 MG/ML IJ SOLN: 15.0000 mg | Freq: Once | INTRAMUSCULAR | Status: DC | PRN

## 2012-11-26 MED ORDER — LIDOCAINE HCL (CARDIAC) 20 MG/ML IV SOLN
INTRAVENOUS | Status: AC
  Filled 2012-11-26: qty 5

## 2012-11-26 MED ORDER — KETOROLAC TROMETHAMINE 30 MG/ML IJ SOLN
INTRAMUSCULAR | Status: DC | PRN
  Administered 2012-11-26: 30 mg via INTRAMUSCULAR
  Administered 2012-11-26: 30 mg via INTRAVENOUS

## 2012-11-26 MED ORDER — MIDAZOLAM HCL 2 MG/2ML IJ SOLN
INTRAMUSCULAR | Status: AC
  Filled 2012-11-26: qty 2

## 2012-11-26 MED ORDER — PROPOFOL 10 MG/ML IV EMUL
INTRAVENOUS | Status: DC | PRN
  Administered 2012-11-26: 200 mg via INTRAVENOUS

## 2012-11-26 MED ORDER — LIDOCAINE HCL (CARDIAC) 20 MG/ML IV SOLN
INTRAVENOUS | Status: DC | PRN
  Administered 2012-11-26: 50 mg via INTRAVENOUS

## 2012-11-26 MED ORDER — MEPERIDINE HCL 25 MG/ML IJ SOLN: 6.2500 mg | INTRAMUSCULAR | Status: DC | PRN

## 2012-11-26 MED ORDER — ONDANSETRON HCL 4 MG/2ML IJ SOLN
INTRAMUSCULAR | Status: DC | PRN
  Administered 2012-11-26: 4 mg via INTRAVENOUS

## 2012-11-26 MED ORDER — LACTATED RINGERS IV SOLN
INTRAVENOUS | Status: DC | PRN
  Administered 2012-11-26: 09:00:00 via INTRAVENOUS

## 2012-11-26 MED ORDER — PROPOFOL 10 MG/ML IV EMUL
INTRAVENOUS | Status: AC
  Filled 2012-11-26: qty 20

## 2012-11-26 MED ORDER — HYDROCODONE-ACETAMINOPHEN 5-325 MG PO TABS
1.0000 | ORAL_TABLET | Freq: Once | ORAL | Status: AC
  Administered 2012-11-26: 1 via ORAL

## 2012-11-26 MED ORDER — FENTANYL CITRATE 0.05 MG/ML IJ SOLN
INTRAMUSCULAR | Status: AC
  Filled 2012-11-26: qty 2

## 2012-11-26 MED ORDER — DEXAMETHASONE SODIUM PHOSPHATE 10 MG/ML IJ SOLN
INTRAMUSCULAR | Status: AC
  Filled 2012-11-26: qty 1

## 2012-11-26 MED ORDER — KETOROLAC TROMETHAMINE 30 MG/ML IJ SOLN
INTRAMUSCULAR | Status: AC
  Filled 2012-11-26: qty 1

## 2012-11-26 MED ORDER — FENTANYL CITRATE 0.05 MG/ML IJ SOLN
25.0000 ug | INTRAMUSCULAR | Status: DC | PRN
  Administered 2012-11-26: 50 ug via INTRAVENOUS

## 2012-11-26 MED ORDER — FENTANYL CITRATE 0.05 MG/ML IJ SOLN
INTRAMUSCULAR | Status: AC
  Administered 2012-11-26: 50 ug via INTRAVENOUS
  Filled 2012-11-26: qty 2

## 2012-11-26 MED ORDER — IBUPROFEN 800 MG PO TABS: 800.0000 mg | ORAL_TABLET | Freq: Three times a day (TID) | ORAL | Status: DC | PRN

## 2012-11-26 MED ORDER — CHLOROPROCAINE HCL 1 % IJ SOLN
INTRAMUSCULAR | Status: DC | PRN
  Administered 2012-11-26: 20 mL

## 2012-11-26 MED ORDER — CHLOROPROCAINE HCL 1 % IJ SOLN
INTRAMUSCULAR | Status: AC
  Filled 2012-11-26: qty 30

## 2012-11-26 MED ORDER — ONDANSETRON HCL 4 MG/2ML IJ SOLN: 4.0000 mg | Freq: Once | INTRAMUSCULAR | Status: DC | PRN

## 2012-11-26 MED ORDER — FENTANYL CITRATE 0.05 MG/ML IJ SOLN
INTRAMUSCULAR | Status: DC | PRN
  Administered 2012-11-26 (×2): 100 ug via INTRAVENOUS

## 2012-11-26 MED ORDER — HYDROCODONE-ACETAMINOPHEN 5-325 MG PO TABS
ORAL_TABLET | ORAL | Status: AC
  Filled 2012-11-26: qty 1

## 2012-11-26 MED ORDER — ONDANSETRON HCL 4 MG/2ML IJ SOLN
INTRAMUSCULAR | Status: AC
  Filled 2012-11-26: qty 2

## 2012-11-26 MED ORDER — MIDAZOLAM HCL 5 MG/5ML IJ SOLN
INTRAMUSCULAR | Status: DC | PRN
  Administered 2012-11-26: 2 mg via INTRAVENOUS

## 2012-11-26 MED ORDER — LACTATED RINGERS IV SOLN
INTRAVENOUS | Status: DC
  Administered 2012-11-26: 09:00:00 via INTRAVENOUS

## 2012-11-26 SURGICAL SUPPLY — 12 items
ABLATOR ENDOMETRIAL BIPOLAR (ABLATOR) ×3 IMPLANT
CATH ROBINSON RED A/P 16FR (CATHETERS) ×3 IMPLANT
CLOTH BEACON ORANGE TIMEOUT ST (SAFETY) ×3 IMPLANT
DRESSING TELFA 8X3 (GAUZE/BANDAGES/DRESSINGS) ×3 IMPLANT
GLOVE BIO SURGEON STRL SZ 6.5 (GLOVE) ×3 IMPLANT
GLOVE BIOGEL PI IND STRL 7.0 (GLOVE) ×4 IMPLANT
GLOVE BIOGEL PI INDICATOR 7.0 (GLOVE) ×2
GOWN STRL REIN XL XLG (GOWN DISPOSABLE) ×6 IMPLANT
PACK HYSTEROSCOPY LF (CUSTOM PROCEDURE TRAY) ×3 IMPLANT
PAD OB MATERNITY 4.3X12.25 (PERSONAL CARE ITEMS) ×3 IMPLANT
TOWEL OR 17X24 6PK STRL BLUE (TOWEL DISPOSABLE) ×6 IMPLANT
WATER STERILE IRR 1000ML POUR (IV SOLUTION) ×3 IMPLANT

## 2012-11-26 NOTE — Transfer of Care (Signed)
Immediate Anesthesia Transfer of Care Note  Patient: Stacy Hull  Procedure(s) Performed: Procedure(s) (LRB) with comments: HYSTEROSCOPY WITH NOVASURE (N/A)  Patient Location: PACU  Anesthesia Type:General  Level of Consciousness: awake, alert , oriented and patient cooperative  Airway & Oxygen Therapy: Patient Spontanous Breathing and Patient connected to nasal cannula oxygen  Post-op Assessment: Report given to PACU RN, Post -op Vital signs reviewed and stable and Patient moving all extremities X 4  Post vital signs: Reviewed and stable  Complications: No apparent anesthesia complications

## 2012-11-26 NOTE — Brief Op Note (Signed)
11/26/2012  10:10 AM  PATIENT:  Glori Luis  41 y.o. female  PRE-OPERATIVE DIAGNOSIS:  Menorrhagia, Previous Cesarean Section  POST-OPERATIVE DIAGNOSIS:  Menorrhagia, Previous Cesarean Section  PROCEDURE:  SURGEON:  Diagnostic hysteroscopy, Novasure endometrial ablation   Surgeon(s) and Role:    * Karenna Romanoff A Katiana Ruland, MD - Primary  PHYSICIAN ASSISTANT:   ASSISTANTS: none   ANESTHESIA:   general and paracervical block Findings: tubal ostia seen, post thickening no discrete mass EBL:  Total I/O In: 500 [I.V.:500] Out: 50 [Urine:50]  BLOOD ADMINISTERED:none  DRAINS: none   LOCAL MEDICATIONS USED:  OTHER nesicaine  SPECIMEN:  No Specimen  DISPOSITION OF SPECIMEN:  N/A  COUNTS:  YES  TOURNIQUET:  * No tourniquets in log *  DICTATION: .Other Dictation: Dictation Number   PLAN OF CARE: Discharge to home after PACU  PATIENT DISPOSITION:  PACU - hemodynamically stable.   Delay start of Pharmacological VTE agent (>24hrs) due to surgical blood loss or risk of bleeding: no

## 2012-11-26 NOTE — Anesthesia Postprocedure Evaluation (Signed)
Anesthesia Post Note  Patient: Stacy Hull  Procedure(s) Performed: Procedure(s) (LRB): HYSTEROSCOPY WITH NOVASURE (N/A)  Anesthesia type: General  Patient location: PACU  Post pain: Pain level controlled  Post assessment: Post-op Vital signs reviewed  Last Vitals:  Filed Vitals:   11/26/12 1015  BP:   Pulse: 73  Temp:   Resp: 16    Post vital signs: Reviewed  Level of consciousness: sedated  Complications: No apparent anesthesia complicationsfj

## 2012-11-26 NOTE — Anesthesia Preprocedure Evaluation (Signed)
Anesthesia Evaluation  Patient identified by MRN, date of birth, ID band Patient awake    Reviewed: Allergy & Precautions, H&P , NPO status , Patient's Chart, lab work & pertinent test results, reviewed documented beta blocker date and time   Airway Mallampati: I TM Distance: >3 FB Neck ROM: full    Dental No notable dental hx. (+) Teeth Intact   Pulmonary neg pulmonary ROS,    Pulmonary exam normal       Cardiovascular hypertension, Pt. on medications     Neuro/Psych PSYCHIATRIC DISORDERS Anxiety Depression negative neurological ROS     GI/Hepatic negative GI ROS, Neg liver ROS,   Endo/Other  negative endocrine ROS  Renal/GU negative Renal ROS  negative genitourinary   Musculoskeletal negative musculoskeletal ROS (+)   Abdominal Normal abdominal exam  (+)   Peds negative pediatric ROS (+)  Hematology negative hematology ROS (+)   Anesthesia Other Findings   Reproductive/Obstetrics negative OB ROS                           Anesthesia Physical Anesthesia Plan  ASA: II  Anesthesia Plan: General   Post-op Pain Management:    Induction: Intravenous  Airway Management Planned: LMA  Additional Equipment:   Intra-op Plan:   Post-operative Plan:   Informed Consent: I have reviewed the patients History and Physical, chart, labs and discussed the procedure including the risks, benefits and alternatives for the proposed anesthesia with the patient or authorized representative who has indicated his/her understanding and acceptance.     Plan Discussed with: CRNA and Surgeon  Anesthesia Plan Comments:         Anesthesia Quick Evaluation

## 2012-11-27 NOTE — Op Note (Signed)
NAME:  Stacy Hull, Stacy Hull            ACCOUNT NO.:  [id number]  MEDICAL RECORD NO.:  [id number]  LOCATION:  [facility]                          FACILITY:  [facility]  PHYSICIAN:  Servando Salina, M.D.DATE OF BIRTH:  1970/12/07  DATE OF PROCEDURE:  11/26/2012 DATE OF DISCHARGE:  11/26/2012                              OPERATIVE REPORT   PREOPERATIVE DIAGNOSIS:  Menorrhagia, previous cesarean section.  POSTOPERATIVE DIAGNOSIS:  Menorrhagia, previous cesarean section.  PROCEDURE:  Diagnostic hysteroscopy, NovaSure endometrial ablation.  ANESTHESIA:  General, paracervical block.  SURGEON:  Servando Salina, M.D.  ASSISTANT:  None.  PROCEDURE:  Under adequate general anesthesia, the patient was placed in the dorsal lithotomy position.  She was sterilely prepped and draped in usual fashion.  The bladder was catheterized for moderate amount of urine.  Examination under anesthesia revealed anteverted uterus.  No adnexal masses could be appreciated.  Bivalve speculum was placed in the vagina.  Single-tooth tenaculum was placed on the anterior lip of the cervix.  20 mL of 1% Nesacaine was injected paracervically at 3 and 9 o'clock positions.  The uterus had sounded to 9 cm.  The cervix was dilated and then subsequently endocervical canal sounded to 4 cm giving the uterine length of 5 cm.  The diagnostic hysteroscope was then introduced into the uterine cavity.  Both tubal ostia could be seen. Posterior endometrial thickening was noted, but no other lesions was noted.  The hysteroscope was removed.  The cervix was then further dilated in order to place the NovaSure endometrial ablation apparatus which was then inserted and a width of 3 cm was achieved.  Endometrial ablation was started at a power of 83 and 75 minutes of ablation occurred.  The NovaSure ablation apparatus was then removed.  The diagnostic hysteroscope was then inserted.  Good ablation was achieved throughout the cavity.  The  procedure was then felt to be complete and all instruments were then removed from the vagina.  SPECIMENS:  None.  ESTIMATED BLOOD LOSS:  Minimal.  COMPLICATIONS:  None.  The patient tolerated the procedure well, was transferred to recovery in stable condition.     Servando Salina, M.D.     [place]/MEDQ  D:  11/26/2012  T:  11/27/2012  Job:  [id number]

## 2012-11-29 ENCOUNTER — Encounter (HOSPITAL_COMMUNITY): Payer: Self-pay | Admitting: Obstetrics and Gynecology

## 2012-12-21 ENCOUNTER — Ambulatory Visit (INDEPENDENT_AMBULATORY_CARE_PROVIDER_SITE_OTHER): Payer: BC Managed Care – PPO | Admitting: Physician Assistant

## 2012-12-21 VITALS — BP 142/92 | HR 90 | Temp 98.2°F | Resp 18 | Ht 66.0 in | Wt 189.0 lb

## 2012-12-21 DIAGNOSIS — R6889 Other general symptoms and signs: Secondary | ICD-10-CM

## 2012-12-21 DIAGNOSIS — J3489 Other specified disorders of nose and nasal sinuses: Secondary | ICD-10-CM

## 2012-12-21 DIAGNOSIS — J111 Influenza due to unidentified influenza virus with other respiratory manifestations: Secondary | ICD-10-CM

## 2012-12-21 DIAGNOSIS — R05 Cough: Secondary | ICD-10-CM

## 2012-12-21 LAB — POCT INFLUENZA A/B: Influenza A, POC: NEGATIVE

## 2012-12-21 MED ORDER — HYDROCODONE-HOMATROPINE 5-1.5 MG/5ML PO SYRP: 5.0000 mL | ORAL_SOLUTION | Freq: Three times a day (TID) | ORAL | Status: DC | PRN

## 2012-12-21 MED ORDER — IPRATROPIUM BROMIDE 0.03 % NA SOLN: 2.0000 | Freq: Two times a day (BID) | NASAL | Status: DC

## 2012-12-21 MED ORDER — BENZONATATE 100 MG PO CAPS: 100.0000 mg | ORAL_CAPSULE | Freq: Three times a day (TID) | ORAL | Status: DC | PRN

## 2012-12-21 MED ORDER — FLUTICASONE PROPIONATE 50 MCG/ACT NA SUSP: 2.0000 | Freq: Two times a day (BID) | NASAL | Status: DC

## 2012-12-21 MED ORDER — AMOXICILLIN-POT CLAVULANATE 875-125 MG PO TABS: 1.0000 | ORAL_TABLET | Freq: Two times a day (BID) | ORAL | Status: DC

## 2012-12-21 NOTE — Progress Notes (Signed)
Subjective:    Patient ID: Stacy Hull, female    DOB: January 18, 1971, 42 y.o.   MRN: [id number]  HPI   Stacy Hull is a pleasant 42 yr old female here with 5 days of URI symptoms and concern for sinus infection.  Began Friday evening with sinus congestion.  Now having "discolored" nasal drainage.  Also has a dry cough.  Has not taken her temp, so does not know if she has had a fever.  Does endorse chills, body aches, and muscle aches.  She did have a flu shot.  Daughter has also had a fever for the last several days.  Pt has been using alka seltzer cold and sinus.  Seemed to help symptoms somewhat, but also increased her BP.      Review of Systems  Constitutional: Positive for chills. Negative for fever.  HENT: Positive for ear pain, congestion, rhinorrhea, postnasal drip and sinus pressure. Negative for sore throat.   Respiratory: Positive for cough. Negative for shortness of breath and wheezing.   Cardiovascular: Negative.   Gastrointestinal: Negative.   Musculoskeletal: Positive for myalgias and arthralgias.  Skin: Negative.   Neurological: Positive for headaches.       Objective:   Physical Exam  Vitals reviewed. Constitutional: She is oriented to person, place, and time. She appears well-developed and well-nourished. No distress.  HENT:  Head: Normocephalic and atraumatic.  Right Ear: Tympanic membrane and ear canal normal.  Left Ear: Tympanic membrane and ear canal normal.  Nose: Right sinus exhibits frontal sinus tenderness. Right sinus exhibits no maxillary sinus tenderness. Left sinus exhibits frontal sinus tenderness. Left sinus exhibits no maxillary sinus tenderness.  Mouth/Throat: Uvula is midline, oropharynx is clear and moist and mucous membranes are normal.  Eyes: Conjunctivae normal are normal. No scleral icterus.  Neck: Neck supple.  Cardiovascular: Normal rate, regular rhythm and normal heart sounds.  Exam reveals no gallop and no friction rub.   No murmur  heard. Pulmonary/Chest: Effort normal and breath sounds normal. She has no wheezes. She has no rales.  Lymphadenopathy:    She has no cervical adenopathy.  Neurological: She is alert and oriented to person, place, and time.  Skin: Skin is warm and dry.  Psychiatric: She has a normal mood and affect. Her behavior is normal.      Filed Vitals:   12/21/12 0811  BP: 142/92  Pulse: 90  Temp: 98.2 F (36.8 C)  Resp: 18      Results for orders placed in visit on 12/21/12  POCT INFLUENZA A/B      Component Value Range   Influenza A, POC Negative     Influenza B, POC Negative         Assessment & Plan:   1. Sinus pressure  fluticasone (FLONASE) 50 MCG/ACT nasal spray, ipratropium (ATROVENT) 0.03 % nasal spray, amoxicillin-clavulanate (AUGMENTIN) 875-125 MG per tablet  2. Cough  benzonatate (TESSALON) 100 MG capsule, HYDROcodone-homatropine (HYCODAN) 5-1.5 MG/5ML syrup  3. Flu-like symptoms  POCT Influenza A/B     Stacy Hull is a pleasant 42 yr old female here with 5 days of URI symptoms.  Rapid flu is negative.  Physical exam is WNL aside from some frontal sinus tenderness.  Discussed with pt that I feel that she likely does not need an antibiotic at this point.  Pt feels like she will end up having to come back for an antibiotic, would like one sent in in case she needs it.  I have sent amox/clav  to be started in 3-4 days if continued facial pain and/or fever.  Will treat symptoms with Atrovent, Flonase, Tessalon, and Hycodan.  Encouraged plenty of fluids and rest.  Discussed RTC precautions.  Pt is in agreement with this plan.

## 2012-12-21 NOTE — Patient Instructions (Addendum)
Begin using Atrovent and Flonase nasal sprays for relief of congestion and sinus pressure.  Separate the dosing by about 20-30 minutes so you get the full effect of both.  Tessalon for cough during the day if needed.  Hycodan for cough at night - this will make you sleepy.  Plenty of fluids and rest.  Good hand hygiene.  Cover your mouth when coughing or sneezing.  I have sent Augmentin (antibiotic) to the pharmacy.  If in 3-4 days you are continuing to have facial pain, or if you start running a fever, you may start this medication.  If you start it, be sure to finish the full course.  Please let us know if you are worsening or not improving.     Upper Respiratory Infection, Adult An upper respiratory infection (URI) is also sometimes known as the common cold. The upper respiratory tract includes the nose, sinuses, throat, trachea, and bronchi. Bronchi are the airways leading to the lungs. Most people improve within 1 week, but symptoms can last up to 2 weeks. A residual cough may last even longer.  CAUSES Many different viruses can infect the tissues lining the upper respiratory tract. The tissues become irritated and inflamed and often become very moist. Mucus production is also common. A cold is contagious. You can easily spread the virus to others by oral contact. This includes kissing, sharing a glass, coughing, or sneezing. Touching your mouth or nose and then touching a surface, which is then touched by another person, can also spread the virus. SYMPTOMS  Symptoms typically develop 1 to 3 days after you come in contact with a cold virus. Symptoms vary from person to person. They may include:  Runny nose.  Sneezing.  Nasal congestion.  Sinus irritation.  Sore throat.  Loss of voice (laryngitis).  Cough.  Fatigue.  Muscle aches.  Loss of appetite.  Headache.  Low-grade fever. DIAGNOSIS  You might diagnose your own cold based on familiar symptoms, since most people get a cold  2 to 3 times a year. Your caregiver can confirm this based on your exam. Most importantly, your caregiver can check that your symptoms are not due to another disease such as strep throat, sinusitis, pneumonia, asthma, or epiglottitis. Blood tests, throat tests, and X-rays are not necessary to diagnose a common cold, but they may sometimes be helpful in excluding other more serious diseases. Your caregiver will decide if any further tests are required. RISKS AND COMPLICATIONS  You may be at risk for a more severe case of the common cold if you smoke cigarettes, have chronic heart disease (such as heart failure) or lung disease (such as asthma), or if you have a weakened immune system. The very young and very old are also at risk for more serious infections. Bacterial sinusitis, middle ear infections, and bacterial pneumonia can complicate the common cold. The common cold can worsen asthma and chronic obstructive pulmonary disease (COPD). Sometimes, these complications can require emergency medical care and may be life-threatening. PREVENTION  The best way to protect against getting a cold is to practice good hygiene. Avoid oral or hand contact with people with cold symptoms. Wash your hands often if contact occurs. There is no clear evidence that vitamin C, vitamin E, echinacea, or exercise reduces the chance of developing a cold. However, it is always recommended to get plenty of rest and practice good nutrition. TREATMENT  Treatment is directed at relieving symptoms. There is no cure. Antibiotics are not effective, because the  infection is caused by a virus, not by bacteria. Treatment may include:  Increased fluid intake. Sports drinks offer valuable electrolytes, sugars, and fluids.  Breathing heated mist or steam (vaporizer or shower).  Eating chicken soup or other clear broths, and maintaining good nutrition.  Getting plenty of rest.  Using gargles or lozenges for comfort.  Controlling fevers  with ibuprofen or acetaminophen as directed by your caregiver.  Increasing usage of your inhaler if you have asthma. Zinc gel and zinc lozenges, taken in the first 24 hours of the common cold, can shorten the duration and lessen the severity of symptoms. Pain medicines may help with fever, muscle aches, and throat pain. A variety of non-prescription medicines are available to treat congestion and runny nose. Your caregiver can make recommendations and may suggest nasal or lung inhalers for other symptoms.  HOME CARE INSTRUCTIONS   Only take over-the-counter or prescription medicines for pain, discomfort, or fever as directed by your caregiver.  Use a warm mist humidifier or inhale steam from a shower to increase air moisture. This may keep secretions moist and make it easier to breathe.  Drink enough water and fluids to keep your urine clear or pale yellow.  Rest as needed.  Return to work when your temperature has returned to normal or as your caregiver advises. You may need to stay home longer to avoid infecting others. You can also use a face mask and careful hand washing to prevent spread of the virus. SEEK MEDICAL CARE IF:   After the first few days, you feel you are getting worse rather than better.  You need your caregiver's advice about medicines to control symptoms.  You develop chills, worsening shortness of breath, or brown or red sputum. These may be signs of pneumonia.  You develop yellow or brown nasal discharge or pain in the face, especially when you bend forward. These may be signs of sinusitis.  You develop a fever, swollen neck glands, pain with swallowing, or white areas in the back of your throat. These may be signs of strep throat. SEEK IMMEDIATE MEDICAL CARE IF:   You have a fever.  You develop severe or persistent headache, ear pain, sinus pain, or chest pain.  You develop wheezing, a prolonged cough, cough up blood, or have a change in your usual mucus (if you  have chronic lung disease).  You develop sore muscles or a stiff neck. Document Released: 05/13/2001 Document Revised: 02/09/2012 Document Reviewed: 03/21/2011 [facility] Patient Information 2013 [place], [place].

## 2013-04-19 ENCOUNTER — Other Ambulatory Visit: Payer: Self-pay | Admitting: Family Medicine

## 2013-06-29 ENCOUNTER — Ambulatory Visit (INDEPENDENT_AMBULATORY_CARE_PROVIDER_SITE_OTHER): Payer: BC Managed Care – PPO | Admitting: Physician Assistant

## 2013-06-29 VITALS — BP 140/96 | HR 82 | Temp 98.3°F | Resp 18 | Ht 67.5 in | Wt 188.0 lb

## 2013-06-29 DIAGNOSIS — K649 Unspecified hemorrhoids: Secondary | ICD-10-CM

## 2013-06-29 DIAGNOSIS — R6889 Other general symptoms and signs: Secondary | ICD-10-CM

## 2013-06-29 MED ORDER — HYDROCORTISONE ACETATE 25 MG RE SUPP: 25.0000 mg | Freq: Two times a day (BID) | RECTAL | Status: DC

## 2013-06-29 MED ORDER — LIDOCAINE HCL 2 % EX GEL: CUTANEOUS | Status: DC | PRN

## 2013-06-29 NOTE — Progress Notes (Signed)
  Subjective:    Patient ID: Stacy Hull, female    DOB: 15-Dec-1970, 42 y.o.   MRN: [id number]  HPI This 42 y.o. female presents for evaluation of "a hemorrhoid and thrush."  When pregnant with her son, 10 years ago, developed a thrombosed hemorrhoid and had a procedure with Dr. Gerrit Friends.  Travelled last week (flew home yesterday) and had her period, and notes that she has had a recurrence.  She had diarrhea yesterday. She has pain with BM, feels a lump, bleeding with BM, itching.  Also, notes a change in taste, things taste weird, and her tongue looks white.  No pain of the tongue or sore throat.  No recent antibiotics. Does has generalized fatigue and malaise, achey, "like I'm coming down with something," fever/chills subjectively.  Past medical history, surgical history, family history, social history and problem list reviewed.  Review of Systems As above.    Objective:   Physical Exam  Vitals reviewed. Constitutional: She is oriented to person, place, and time. She appears well-developed and well-nourished. She is active and cooperative. No distress.  HENT:  Head: Normocephalic and atraumatic.  Right Ear: Hearing normal.  Left Ear: Hearing normal.  Eyes: EOM are normal. Pupils are equal, round, and reactive to light.  Neck: Normal range of motion. Neck supple. No thyromegaly present.  Cardiovascular: Normal rate, regular rhythm and normal heart sounds.   Pulses:      Radial pulses are 2+ on the right side, and 2+ on the left side.       Dorsalis pedis pulses are 2+ on the right side, and 2+ on the left side.       Posterior tibial pulses are 2+ on the right side, and 2+ on the left side.  Pulmonary/Chest: Effort normal and breath sounds normal.  Genitourinary: Rectal exam shows external hemorrhoid and tenderness. Rectal exam shows anal tone normal.     Non-thrombosed hemorrhoid.  Very tender.  Lymphadenopathy:       Head (right side): No tonsillar, no preauricular, no  posterior auricular and no occipital adenopathy present.       Head (left side): No tonsillar, no preauricular, no posterior auricular and no occipital adenopathy present.    She has no cervical adenopathy.       Right: No supraclavicular adenopathy present.       Left: No supraclavicular adenopathy present.  Neurological: She is alert and oriented to person, place, and time. No sensory deficit.  Skin: Skin is warm, dry and intact. No rash noted. No cyanosis or erythema. Nails show no clubbing.  Psychiatric: She has a normal mood and affect.          Assessment & Plan:  Hemorrhoid - Plan: hydrocortisone (ANUSOL-HC) 25 MG suppository, lidocaine (XYLOCAINE) 2 % jelly  Flu-like symptoms Anticipatory guidance, supportive care.  RTC if symptoms worsen/persist.  Fernande Bras, PA-C Physician Assistant-Certified [facility]

## 2013-06-29 NOTE — Patient Instructions (Addendum)
Consider using an OTC diaper ointment (I like Balmex) after bowel movements. Consider using flushable wipes to clean after bowel movements.  Get plenty of rest and drink at least 64 ounces of water daily. I expect that your generalized symptoms will resolve or reveal something that we can identify and treat.

## 2013-07-07 ENCOUNTER — Ambulatory Visit (INDEPENDENT_AMBULATORY_CARE_PROVIDER_SITE_OTHER): Payer: Self-pay | Admitting: General Surgery

## 2013-07-19 LAB — CBC AND DIFFERENTIAL
Hemoglobin: 15.2 g/dL (ref 12.0–16.0)
Platelets: 323 10*3/uL (ref 150–399)
WBC: 8 10^3/mL

## 2013-07-19 LAB — TSH: TSH: 1.48 u[IU]/mL (ref <=5.90)

## 2013-07-19 LAB — HEPATIC FUNCTION PANEL
ALT: 13 U/L (ref 7–35)
AST: 15 U/L (ref 13–35)
Alkaline Phosphatase: 58 U/L (ref 25–125)
Bilirubin, Total: 0.6 mg/dL

## 2013-07-21 ENCOUNTER — Ambulatory Visit (INDEPENDENT_AMBULATORY_CARE_PROVIDER_SITE_OTHER): Payer: BC Managed Care – PPO | Admitting: Family Medicine

## 2013-07-21 VITALS — BP 152/86 | HR 92 | Temp 99.3°F | Resp 18 | Wt 190.8 lb

## 2013-07-21 DIAGNOSIS — R7309 Other abnormal glucose: Secondary | ICD-10-CM

## 2013-07-21 DIAGNOSIS — I10 Essential (primary) hypertension: Secondary | ICD-10-CM

## 2013-07-21 DIAGNOSIS — R739 Hyperglycemia, unspecified: Secondary | ICD-10-CM

## 2013-07-21 LAB — POCT URINALYSIS DIPSTICK
Bilirubin, UA: NEGATIVE
Blood, UA: NEGATIVE
Glucose, UA: NEGATIVE
Ketones, UA: NEGATIVE
Spec Grav, UA: 1.02
pH, UA: 6

## 2013-07-21 LAB — POCT UA - MICROSCOPIC ONLY
Bacteria, U Microscopic: NEGATIVE
Casts, Ur, LPF, POC: NEGATIVE
Crystals, Ur, HPF, POC: NEGATIVE
Mucus, UA: NEGATIVE
RBC, urine, microscopic: NEGATIVE

## 2013-07-21 LAB — POCT GLYCOSYLATED HEMOGLOBIN (HGB A1C): Hemoglobin A1C: 5.6

## 2013-07-21 LAB — LIPID PANEL
LDL Cholesterol: 117 mg/dL — ABNORMAL HIGH (ref 0–99)
VLDL: 39 mg/dL (ref 0–40)

## 2013-07-21 NOTE — Progress Notes (Signed)
Subjective:    Patient ID: Stacy Hull, female    DOB: 09/30/1971, 42 y.o.   MRN: [id number] Chief Complaint  Patient presents with  . Hyperglycemia    230 yesterday at GI appt     HPI  Pt has been struggling with anal fissures now for over a month. Seeing Dr. Collene Mares, GI, who has her on topical lidocaine and diltiazem.  The copious blood loss (period-like) has stopped but still having a lot of pain.  Had routine labs done at Dr. Lorie Apley office yesterday - cbc, cmp - and glucose was found to be 230.  She was not fasting - had just eating a chicken biscuit.  She is here to f/u on that - if she does have diabetes, would like to get it under control as quickly as possible.  Is ready to make tlc - feels like this is her "wake-up call."  Has urinary frequency but this is at baseline and has been that way her whole life, she is trying to drink much more water than usual due to the anal fissures.  Has also been trying to increase diet in fiber and veggies.  Knows that her cholesterol is probably bad but is fasting today so would like it rechecked.  Past Medical History  Diagnosis Date  . Hyperlipidemia   . HTN (hypertension)   . Anxiety   . Allergic rhinitis    Current Outpatient Prescriptions on File Prior to Visit  Medication Sig Dispense Refill  . ALPRAZolam (XANAX) 0.5 MG tablet Take 1 tablet (0.5 mg total) by mouth at bedtime as needed for sleep. Needs OV before any further refills.  30 tablet  3  . citalopram (CELEXA) 20 MG tablet Take 1 tablet (20 mg total) by mouth daily.  90 tablet  3  . hydrocortisone (ANUSOL-HC) 25 MG suppository Place 1 suppository (25 mg total) rectally 2 (two) times daily.  12 suppository  0  . lidocaine (XYLOCAINE) 2 % jelly Apply topically as needed.  30 mL  0  . lisinopril-hydrochlorothiazide (PRINZIDE,ZESTORETIC) 20-12.5 MG per tablet Take 1 tablet by mouth daily.  90 tablet  3  . Multiple Vitamin (MULTIVITAMIN) tablet Take 1 tablet by mouth daily.        No current facility-administered medications on file prior to visit.   Allergies  Allergen Reactions  . Erythromycin Other (See Comments)    G I upset.     Review of Systems  Gastrointestinal: Positive for anal bleeding and rectal pain. Negative for nausea, vomiting, diarrhea, constipation and blood in stool.  Endocrine: Positive for polyuria. Negative for polydipsia and polyphagia.  Genitourinary: Positive for frequency. Negative for dysuria, urgency, decreased urine volume, enuresis and difficulty urinating.      BP 152/86  Pulse 92  Temp(Src) 99.3 F (37.4 C) (Oral)  Resp 18  Wt 190 lb 12.8 oz (86.546 kg)  BMI 29.43 kg/m2  SpO2 98%  LMP 06/23/2013 Objective:   Physical Exam  Constitutional: She is oriented to person, place, and time. She appears well-developed and well-nourished. No distress.  HENT:  Head: Normocephalic and atraumatic.  Right Ear: External ear normal.  Eyes: Conjunctivae are normal. No scleral icterus.  Pulmonary/Chest: Effort normal.  Neurological: She is alert and oriented to person, place, and time.  Skin: Skin is warm and dry. She is not diaphoretic. No erythema.  Psychiatric: She has a normal mood and affect. Her behavior is normal.          Results for orders  placed in visit on 07/21/13  GLUCOSE, POCT (MANUAL RESULT ENTRY)      Result Value Range   POC Glucose 95  70 - 99 mg/dl  POCT GLYCOSYLATED HEMOGLOBIN (HGB A1C)      Result Value Range   Hemoglobin A1C 5.6    POCT URINALYSIS DIPSTICK      Result Value Range   Color, UA yellow     Clarity, UA clear     Glucose, UA neg     Bilirubin, UA neg     Ketones, UA neg     Spec Grav, UA 1.020     Blood, UA neg     pH, UA 6.0     Protein, UA neg     Urobilinogen, UA 0.2     Nitrite, UA neg     Leukocytes, UA Negative    POCT UA - MICROSCOPIC ONLY      Result Value Range   WBC, Ur, HPF, POC neg     RBC, urine, microscopic neg     Bacteria, U Microscopic neg     Mucus, UA neg      Epithelial cells, urine per micros 1-3     Crystals, Ur, HPF, POC neg     Casts, Ur, LPF, POC neg     Yeast, UA neg      Assessment & Plan:  HYPERTENSION - elev w/ pain from anal fissure and anxiety re: elev BS.  Checks BP at home - husband is a paramedic so able to help her monitor.  Will call or RTC if cont to be increaesd.  Hyperglycemia - Plan: POCT glucose (manual entry), POCT glycosylated hemoglobin (Hb A1C), POCT urinalysis dipstick, POCT UA - Microscopic Only, Lipid panel - pt reassured that cbg of 230 coincidental to recent high carb/white flour intake but reassured today by nml fasting cbg and a1c.  Pt plans to decrease diet in white flour products/carbs to work w/ weight loss as well and increase exercise.

## 2013-07-21 NOTE — Patient Instructions (Addendum)
Diabetes Meal Planning Guide The diabetes meal planning guide is a tool to help you plan your meals and snacks. It is important for people with diabetes to manage their blood glucose (sugar) levels. Choosing the right foods and the right amounts throughout your day will help control your blood glucose. Eating right can even help you improve your blood pressure and reach or maintain a healthy weight. CARBOHYDRATE COUNTING MADE EASY When you eat carbohydrates, they turn to sugar. This raises your blood glucose level. Counting carbohydrates can help you control this level so you feel better. When you plan your meals by counting carbohydrates, you can have more flexibility in what you eat and balance your medicine with your food intake. Carbohydrate counting simply means adding up the total amount of carbohydrate grams in your meals and snacks. Try to eat about the same amount at each meal. Foods with carbohydrates are listed below. Each portion below is 1 carbohydrate serving or 15 grams of carbohydrates. Ask your dietician how many grams of carbohydrates you should eat at each meal or snack. Grains and Starches  1 slice bread.   English muffin or hotdog/hamburger bun.   cup cold cereal (unsweetened).   cup cooked pasta or rice.   cup starchy vegetables (corn, potatoes, peas, beans, winter squash).  1 tortilla (6 inches).   bagel.  1 waffle or pancake (size of a CD).   cup cooked cereal.  4 to 6 small crackers. *Whole grain is recommended. Fruit  1 cup fresh unsweetened berries, melon, papaya, pineapple.  1 small fresh fruit.   banana or mango.   cup fruit juice (4 oz unsweetened).   cup canned fruit in natural juice or water.  2 tbs dried fruit.  12 to 15 grapes or cherries. Milk and Yogurt  1 cup fat-free or 1% milk.  1 cup soy milk.  6 oz light yogurt with sugar-free sweetener.  6 oz low-fat soy yogurt.  6 oz plain yogurt. Vegetables  1 cup raw or  cup  cooked is counted as 0 carbohydrates or a "free" food.  If you eat 3 or more servings at 1 meal, count them as 1 carbohydrate serving. Other Carbohydrates   oz chips or pretzels.   cup ice cream or frozen yogurt.   cup sherbet or sorbet.  2 inch square cake, no frosting.  1 tbs honey, sugar, jam, jelly, or syrup.  2 small cookies.  3 squares of graham crackers.  3 cups popcorn.  6 crackers.  1 cup broth-based soup.  Count 1 cup casserole or other mixed foods as 2 carbohydrate servings.  Foods with less than 20 calories in a serving may be counted as 0 carbohydrates or a "free" food. You may want to purchase a book or computer software that lists the carbohydrate gram counts of different foods. In addition, the nutrition facts panel on the labels of the foods you eat are a good source of this information. The label will tell you how big the serving size is and the total number of carbohydrate grams you will be eating per serving. Divide this number by 15 to obtain the number of carbohydrate servings in a portion. Remember, 1 carbohydrate serving equals 15 grams of carbohydrate. SERVING SIZES Measuring foods and serving sizes helps you make sure you are getting the right amount of food. The list below tells how big or small some common serving sizes are.  1 oz.........4 stacked dice.  3 oz........Marland KitchenDeck of cards.  1 tsp.......Marland KitchenTip  of little finger.  1 tbs......Marland KitchenMarland KitchenThumb.  2 tbs.......Marland KitchenGolf ball.   cup......Marland KitchenHalf of a fist.  1 cup.......Marland KitchenA fist. SAMPLE DIABETES MEAL PLAN Below is a sample meal plan that includes foods from the grain and starches, dairy, vegetable, fruit, and meat groups. A dietician can individualize a meal plan to fit your calorie needs and tell you the number of servings needed from each food group. However, controlling the total amount of carbohydrates in your meal or snack is more important than making sure you include all of the food groups at every  meal. You may interchange carbohydrate containing foods (dairy, starches, and fruits). The meal plan below is an example of a 2000 calorie diet using carbohydrate counting. This meal plan has 17 carbohydrate servings. Breakfast  1 cup oatmeal (2 carb servings).   cup light yogurt (1 carb serving).  1 cup blueberries (1 carb serving).   cup almonds. Snack  1 large apple (2 carb servings).  1 low-fat string cheese stick. Lunch  Chicken breast salad.  1 cup spinach.   cup chopped tomatoes.  2 oz chicken breast, sliced.  2 tbs low-fat [place] dressing.  12 whole-wheat crackers (2 carb servings).  12 to 15 grapes (1 carb serving).  1 cup low-fat milk (1 carb serving). Snack  1 cup carrots.   cup hummus (1 carb serving). Dinner  3 oz broiled salmon.  1 cup brown rice (3 carb servings). Snack  1  cups steamed broccoli (1 carb serving) drizzled with 1 tsp olive oil and lemon juice.  1 cup light pudding (2 carb servings). DIABETES MEAL PLANNING WORKSHEET Your dietician can use this worksheet to help you decide how many servings of foods and what types of foods are right for you.  BREAKFAST Food Group and Servings / Carb Servings Grain/Starches __________________________________ Dairy __________________________________________ Vegetable ______________________________________ Fruit ___________________________________________ Meat __________________________________________ Fat ____________________________________________ LUNCH Food Group and Servings / Carb Servings Grain/Starches ___________________________________ Dairy ___________________________________________ Fruit ____________________________________________ Meat ___________________________________________ Fat _____________________________________________ Wonda Cheng Food Group and Servings / Carb Servings Grain/Starches ___________________________________ Dairy  ___________________________________________ Fruit ____________________________________________ Meat ___________________________________________ Fat _____________________________________________ SNACKS Food Group and Servings / Carb Servings Grain/Starches ___________________________________ Dairy ___________________________________________ Vegetable _______________________________________ Fruit ____________________________________________ Meat ___________________________________________ Fat _____________________________________________ DAILY TOTALS Starches _________________________ Vegetable ________________________ Fruit ____________________________ Dairy ____________________________ Meat ____________________________ Fat ______________________________ Document Released: 08/14/2005 Document Revised: 02/09/2012 Document Reviewed: 06/25/2009 [employer] Patient Information 2014 [employer]. Diets for Diabetes, Food Labeling Look at food labels to help you decide how much of a product you can eat. You will want to check the amount of total carbohydrate in a serving to see how the food fits into your meal plan. In the list of ingredients, the ingredient present in the largest amount by weight must be listed first, followed by the other ingredients in descending order. STANDARD OF IDENTITY Most products have a list of ingredients. However, foods that the Food and Drug Administration (FDA) has given a standard of identity do not need a list of ingredients. A standard of identity means that a food must contain certain ingredients if it is called a particular name. Examples are mayonnaise, peanut butter, ketchup, jelly, and cheese. LABELING TERMS There are many terms found on food labels. Some of these terms have specific definitions. Some terms are regulated by the FDA, and the FDA has clearly specified how they can be used. Others are not regulated or well-defined and can be misleading and  confusing. SPECIFICALLY DEFINED TERMS Nutritive Sweetener.  A sweetener that contains calories,such as table sugar or  honey. Nonnutritive Sweetener.  A sweetener with few or no calories,such as saccharin, aspartame, sucralose, and cyclamate. LABELING TERMS REGULATED BY THE FDA Free.  The product contains only a tiny or small amount of fat, cholesterol, sodium, sugar, or calories. For example, a "fat-free" product will contain less than 0.5 g of fat per serving. Low.  A food described as "low" in fat, saturated fat, cholesterol, sodium, or calories could be eaten fairly often without exceeding dietary guidelines. For example, "low in fat" means no more than 3 g of fat per serving. Lean.  "Lean" and "extra lean" are U.S. Department of Agriculture Scientist, research (physical sciences)) terms for use on meat and poultry products. "Lean" means the product contains less than 10 g of fat, 4 g of saturated fat, and 95 mg of cholesterol per serving. "Lean" is not as low in fat as a product labeled "low." Extra Lean.  "Extra lean" means the product contains less than 5 g of fat, 2 g of saturated fat, and 95 mg of cholesterol per serving. While "extra lean" has less fat than "lean," it is still higher in fat than a product labeled "low." Reduced, Less, Fewer.  A diet product that contains 25% less of a nutrient or calories than the regular version. For example, hot dogs might be labeled "25% less fat than our regular hot dogs." Light/Lite.  A diet product that contains  fewer calories or  the fat of the original. For example, "light in sodium" means a product with  the usual sodium. More.  One serving contains at least 10% more of the daily value of a vitamin, mineral, or fiber than usual. Good Source Of.  One serving contains 10% to 19% of the daily value for a particular vitamin, mineral, or fiber. Excellent Source Of.  One serving contains 20% or more of the daily value for a particular nutrient. Other terms used might  be "high in" or "rich in." Enriched or Fortified.  The product contains added vitamins, minerals, or protein. Nutrition labeling must be used on enriched or fortified foods. Imitation.  The product has been altered so that it is lower in protein, vitamins, or minerals than the usual food,such as imitation peanut butter. Total Fat.  The number listed is the total of all fat found in a serving of the product. Under total fat, food labels must list saturated fat and trans fat, which are associated with raising bad cholesterol and an increased risk of heart blood vessel disease. Saturated Fat.  Mainly fats from animal-based sources. Some examples are red meat, cheese, cream, whole milk, and coconut oil. Trans Fat.  Found in some fried snack foods, packaged foods, and fried restaurant foods. It is recommended you eat as close to 0 g of trans fat as possible, since it raises bad cholesterol and lowers good cholesterol. Polyunsaturated and Monounsaturated Fats.  More healthful fats. These fats are from plant sources. Total Carbohydrate.  The number of carbohydrate grams in a serving of the product. Under total carbohydrate are listed the other carbohydrate sources, such as dietary fiber and sugars. Dietary Fiber.  A carbohydrate from plant sources. Sugars.  Sugars listed on the label contain all naturally occurring sugars as well as added sugars. LABELING TERMS NOT REGULATED BY THE FDA Sugarless.  Table sugar (sucrose) has not been added. However, the manufacturer may use another form of sugar in place of sucrose to sweeten the product. For example, sugar alcohols are used to sweeten foods. Sugar alcohols are a form  of sugar but are not table sugar. If a product contains sugar alcohols in place of sucrose, it can still be labeled "sugarless." Low Salt, Salt-Free, Unsalted, No Salt, No Salt Added, Without Added Salt.  Food that is usually processed with salt has been made without salt.  However, the food may contain sodium-containing additives, such as preservatives, leavening agents, or flavorings. Natural.  This term has no legal meaning. Organic.  Foods that are certified as organic have been inspected and approved by the USDA to ensure they are produced without pesticides, fertilizers containing synthetic ingredients, bioengineering, or ionizing radiation. Document Released: 11/20/2003 Document Revised: 02/09/2012 Document Reviewed: 06/07/2009 [facility] Patient Information 2014 [place], [place]. Diabetes, Type 2, Am I At Risk? Diabetes is a lasting (chronic) disease. In type 2 diabetes, the pancreas does not make enough insulin, and the body does not respond normally to the insulin that is made. This type of diabetes was also previously called adult onset diabetes. About 90% of all those who have diabetes have type 2. It usually occurs after the age of 36, but can occur at any age.  People develop type 2 diabetes because they do not use insulin properly. Eventually, the pancreas cannot make enough insulin for the body's needs. Over time, the amount of glucose (sugar) in the blood increases. RISK FACTORS  Overweight  the more weight you have, the more resistant your cells become to insulin.  Family history  you are more likely to get diabetes if a parent or sibling has diabetes.  Race certain races get diabetes more.  African Americans.  American Indians.  Asian Americans.  Hispanics.  Pacific Islander.  Inactive exercise helps control weight and helps your cells be more sensitive to insulin.  Gestational diabetes  some women develop diabetes while they are pregnant. This goes away when they deliver. However, they are 50-60% more likely to develop type 2 diabetes at a later time.  Having a baby over 9 pounds  a sign that you may have had gestational diabetes.  Age the risk of diabetes goes up as you get older, especially after age 32.  High blood pressure  (hypertension). SYMPTOMS Many people have no signs or symptoms. Symptoms can be so mild that you might not even notice them. Some of these signs are:  Increased thirst.  Increased hunger.  Tiredness (fatigue).  Increased urination, especially at night.  Weight loss.  Blurred vision.  Sores that do not heal. WHO SHOULD BE TESTED?  Anyone 3 years or older, especially if overweight, should consider getting tested.  If you are younger than 10, overweight, and have one or more of the risk factors, you should consider getting tested. DIAGNOSIS  Fasting blood glucose (FBS). Usually, 2 are done.  FBS 101-125 mg/dl is considered pre-diabetes.  FBS 126 mg/dl or greater is considered diabetes.  2 hour Oral Glucose Tolerance Test (OGTT). This test is preformed by first having you not eat or drink for several hours. You are then given something sweet to drink and your blood glucose is measured fasting, at one hour and 2 hours. This test tells how well you are able to handle sugars or carbohydrates.  Fasting: 60-100 mg/dl.  1 hour: less than 200 mg/dl.  2 hours: less than 140 mg/dl.  A1c A1c is a blood glucose test that gives and average of your blood glucose over 3 months. It is the accepted method to use to diagnose diabetes.  A1c 5.7-6.4% is considered pre-diabetes.  A1c 6.5% or  greater is considered diabetes. WHAT DOES IT MEAN TO HAVE PRE-DIABETES? Pre-diabetes means you are at risk for getting type 2 diabetes. Your blood glucose is higher than normal, but not yet high enough to diagnose diabetes. The good news is, if you have pre-diabetes you can reduce the risk of getting diabetes and even return to normal blood glucose levels. With modest weight loss and moderate physical activity, you can delay or prevent type 2 diabetes.  PREVENTION You cannot do anything about race, age or family history, but you can lower your chances of getting diabetes. You can:   Exercise regularly  and be active.  Reduce fat and calorie intake.  Make wise food choices as much as you can.  Reduce your intake of salt and alcohol.  Maintain a reasonable weight.  Keep blood pressure in an acceptable range. Take medication if needed.  Not smoke.  Maintain an acceptable cholesterol level (HDL, LDL, Triglycerides). Take medication if needed. DOING MY PART: GETTING STARTED Making big changes in your life is hard, especially if you are faced with more than one change. You can make it easier by taking these steps:  Make a plan to change behavior.  Decide exactly what you will do and when you will do it.  Plan what you need to get ready.  Think about what might prevent you from reaching your goals.  Find family and friends who will support and encourage you.  Decide how you will reward yourself when you do what you have planned.  Your doctor, dietitian, or counselor can help you make a plan. HERE ARE SOME OF THE AREAS YOU MAY WISH TO CHANGE TO REDUCE YOUR RISK OF DIABETES. If you are overweight or obese, choose sensible ways to get in shape. Even small amounts of weight loss, like 5-10 pounds, can help reduce the effects of insulin resistance and help blood glucose control. Diet  Avoid crash diets. Instead, eat less of the foods you usually have. Limit the amount of fat you eat.  Increase your physical activity. Aim for at least 30 minutes of exercise most days of the week.  Set a reasonable weight-loss goal, such as losing 1 pound a week. Aim for a long-term goal of losing 5-7% of your total body weight.  Make wise food choices most of the time.  What you eat has a big impact on your health. By making wise food choices, you can help control your body weight, blood pressure, and cholesterol.  Take a hard look at the serving sizes of the foods you eat. Reduce serving sizes of meat, desserts, and foods high in fat. Increase your intake of fruits and vegetables.  Limit your fat  intake to about 25% of your total calories. For example, if your food choices add up to about 2,000 calories a day, try to eat no more than 56 grams of fat. Your caregiver or a dietitian can help you figure out how much fat to have. You can check food labels for fat content too.  You may also want to reduce the number of calories you have each day.  Keep a food log. Write down what you eat, how much you eat, and anything else that helps keep you on track.  When you meet your goal, reward yourself with a nonfood item or activity. Exercise  Be physically active every day.  Keep and exercise log. Write down what exercise you did, for how long, and anything else that keeps you on track.  Regular exercise (like brisk walking) tackles several risk factors at once. It helps you lose weight, it keeps your cholesterol and blood pressure under control, and it helps your body use insulin. People who are physically active for 30 minutes a day, 5 days a week, reduced their risk of type 2 diabetes. If you are not very active, you should start slowly at first. Talk with your caregiver first about what kinds of exercise would be safe for you. Make a plan to increase your activity level with the goal of being active for at least 30 minutes a day, most days of the week.  Choose activities you enjoy. Here are some ways to work extra activity into your daily routine:  Take the stairs rather than an elevator or escalator.  Park at the far end of the lot and walk.  Get off the bus a few stops early and walk the rest of the way.  Walk or bicycle instead of drive whenever you can. Medications Some people need medication to help control their blood pressure or cholesterol levels. If you do, take your medicines as directed. Ask your caregiver whether there are any medicines you can take to prevent type 2 diabetes. Document Released: 11/20/2003 Document Revised: 02/09/2012 Document Reviewed: 08/15/2009 [facility]  Patient Information 2014 [place].

## 2013-08-03 ENCOUNTER — Ambulatory Visit (INDEPENDENT_AMBULATORY_CARE_PROVIDER_SITE_OTHER): Payer: BC Managed Care – PPO | Admitting: Physician Assistant

## 2013-08-03 VITALS — BP 122/70 | HR 101 | Temp 100.1°F | Resp 20 | Ht 67.0 in | Wt 192.0 lb

## 2013-08-03 DIAGNOSIS — R509 Fever, unspecified: Secondary | ICD-10-CM

## 2013-08-03 DIAGNOSIS — J029 Acute pharyngitis, unspecified: Secondary | ICD-10-CM

## 2013-08-03 DIAGNOSIS — IMO0001 Reserved for inherently not codable concepts without codable children: Secondary | ICD-10-CM

## 2013-08-03 LAB — POCT INFLUENZA A/B: Influenza B, POC: NEGATIVE

## 2013-08-03 NOTE — Progress Notes (Signed)
   [address] [place] [address]   Phone [phone]  Subjective:    Patient ID: Stacy Hull, female    DOB: [birth year], 42 y.o.   MRN: [id number]  HPI  Pt presents to clinic with 24h h/o feeling poorly.  She has had fever that had responded to motrin.  She started with chills last pm that have gotten worse along with the fever. She hurts all over like she has the Flu.  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  She has a mild sore throat but otherwise no other cold symptoms.  Review of Systems  Constitutional: Positive for fever (101F), chills and appetite change (decrease).  HENT: Positive for sore throat and neck pain. Negative for congestion, rhinorrhea and postnasal drip.   Respiratory: Negative for cough.   Gastrointestinal: Positive for nausea (mild).  Musculoskeletal: Positive for myalgias.  Neurological: Positive for headaches.       Objective:   Physical Exam  Vitals reviewed. Constitutional: She is oriented to person, place, and time. She appears well-developed and well-nourished.  HENT:  Head: Normocephalic and atraumatic.  Right Ear: Hearing, tympanic membrane, external ear and ear canal normal.  Left Ear: Hearing, tympanic membrane, external ear and ear canal normal.  Nose: Nose normal.  Mouth/Throat: Mucous membranes are normal. Posterior oropharyngeal erythema (mild) present. No oropharyngeal exudate or posterior oropharyngeal edema.  Neck: Normal range of motion.  Cardiovascular: Normal rate, regular rhythm and normal heart sounds.   No murmur heard. Pulmonary/Chest: Effort normal and breath sounds normal.  Lymphadenopathy:    She has no cervical adenopathy.  Neurological: She is alert and oriented to person, place, and time.  Skin: Skin is warm and dry.  Psychiatric: She has a normal mood and affect. Her behavior is normal. Judgment and thought content normal.   Results for orders  placed in visit on 08/03/13  POCT RAPID STREP A (OFFICE)      Result Value Range   Rapid Strep A Screen Negative  Negative  POCT INFLUENZA A/B      Result Value Range   Influenza A, POC Negative     Influenza B, POC Negative         Assessment & Plan:  Fever, unspecified - Plan: POCT rapid strep A, POCT Influenza A/B  Myalgia and myositis - Plan: POCT Influenza A/B  Pt to be out of work tomorrow.  This is most likely a viral illness where she she continue to do symptomatic treatment.  If she develops a cough pt can call for [employer]  She should push fluids and use NSAIDS for fever and myalgias.  Answered questions.  Windell Hummingbird PA-C 08/03/2013 9:25 PM

## 2013-08-05 ENCOUNTER — Telehealth: Payer: Self-pay

## 2013-08-05 NOTE — Telephone Encounter (Signed)
Pt to be out of work tomorrow. This is most likely a viral illness where she she continue to do symptomatic treatment. If she develops a cough pt can call for [employer]. She should push fluids and use NSAIDS for fever and myalgias. This is from office visit. Called her left message to call me back, she should rest, push fluids. Use ibuprofen/ tylenol for fever. If tylenol/ibuprofen does not bring the fever down she should return to clinic. Need to know if her anal fissures are causing her any pain, this may or may not be related.

## 2013-08-05 NOTE — Telephone Encounter (Signed)
Stacy Hull,   You saw patient on Wednesday, she still has a fever.  She forgot to mention she has anal fissures and wonders if this may be the cause.  [phone]

## 2013-08-08 NOTE — Telephone Encounter (Signed)
Called again, to see how she is feeling. Left message for her to call me back if she is still having problems.

## 2013-08-10 ENCOUNTER — Encounter: Payer: Self-pay | Admitting: Family Medicine

## 2013-08-26 ENCOUNTER — Ambulatory Visit (INDEPENDENT_AMBULATORY_CARE_PROVIDER_SITE_OTHER): Payer: BC Managed Care – PPO | Admitting: Family Medicine

## 2013-08-26 ENCOUNTER — Encounter: Payer: Self-pay | Admitting: Family Medicine

## 2013-08-26 VITALS — BP 138/86 | HR 89 | Temp 98.6°F | Resp 16 | Ht 67.0 in | Wt 189.0 lb

## 2013-08-26 DIAGNOSIS — Z23 Encounter for immunization: Secondary | ICD-10-CM

## 2013-08-26 DIAGNOSIS — Z1239 Encounter for other screening for malignant neoplasm of breast: Secondary | ICD-10-CM

## 2013-08-26 DIAGNOSIS — I10 Essential (primary) hypertension: Secondary | ICD-10-CM

## 2013-08-26 MED ORDER — ALPRAZOLAM 0.5 MG PO TABS: 0.5000 mg | ORAL_TABLET | Freq: Every evening | ORAL | Status: DC | PRN

## 2013-08-26 MED ORDER — LISINOPRIL-HYDROCHLOROTHIAZIDE 20-12.5 MG PO TABS: 1.0000 | ORAL_TABLET | Freq: Every day | ORAL | Status: DC

## 2013-08-26 MED ORDER — CITALOPRAM HYDROBROMIDE 20 MG PO TABS: 20.0000 mg | ORAL_TABLET | Freq: Every day | ORAL | Status: DC

## 2013-08-26 NOTE — Progress Notes (Signed)
Subjective:    Patient ID: Stacy Hull, female    DOB: September 11, 1971, 42 y.o.   MRN: [id number] Chief Complaint  Patient presents with  . Medication Refill    xanax    HPI  Doing well overall.  Here for routine med refills.  Father had a brain aneurysm in his 91s - while he was deployed in a very stressful situation and not monitoring his BP. PGF also had a CVA at 40 yo but don't know type or cause as no autopsy was done. Pt was wondering if she needs any brain scan to see if she is has an aneurysm due to her +FHx.  Past Medical History  Diagnosis Date  . Hyperlipidemia   . HTN (hypertension)   . Anxiety   . Allergic rhinitis   . Depression    Current Outpatient Prescriptions on File Prior to Visit  Medication Sig Dispense Refill  . diltiazem 2 % GEL Apply topically 2 (two) times daily.      Marland Kitchen lidocaine (XYLOCAINE) 2 % jelly Apply topically as needed.  30 mL  0  . Multiple Vitamin (MULTIVITAMIN) tablet Take 1 tablet by mouth daily.      . hydrocortisone (ANUSOL-HC) 25 MG suppository Place 1 suppository (25 mg total) rectally 2 (two) times daily.  12 suppository  0   No current facility-administered medications on file prior to visit.   Allergies  Allergen Reactions  . Erythromycin Other (See Comments)    G I upset.   Past Surgical History  Procedure Laterality Date  . Cesarean section  2005  . Parathyroidectomy  1999    benign  . Cervical cone biopsy    . Tubal ligation    . Hysteroscopy with novasure  11/26/2012    Procedure: HYSTEROSCOPY WITH NOVASURE;  Surgeon: Serita Kyle, MD;  Location: [facility];  Service: Gynecology;  Laterality: N/A;   Family History  Problem Relation Age of Onset  . Depression Mother   . Hypertension Father   . Aneurysm Father 42    cerebral  . Diabetes Maternal Grandmother   . Diabetes Maternal Grandfather   . Stroke Paternal Grandfather 65   History   Social History  . Marital Status: Married    Spouse Name: Laquisha Northcraft    Number of Children: 2  . Years of Education: Master's +   Occupational History  . HIGH SCHOOL ENGLISH  [employer]   Social History Main Topics  . Smoking status: Never Smoker   . Smokeless tobacco: Never Used  . Alcohol Use: 2.0 oz/week    4 drink(s) per week     Comment: occasionally  . Drug Use: No  . Sexual Activity: Yes    Partners: Male   Other Topics Concern  . None   Social History Narrative   Lives with her husband and their 2 children. Walks 3x's weekly for exercise.     Review of Systems  Constitutional: Negative for fever, chills, diaphoresis and appetite change.  Eyes: Negative for visual disturbance.  Respiratory: Negative for cough and shortness of breath.   Cardiovascular: Negative for chest pain, palpitations and leg swelling.  Genitourinary: Negative for decreased urine volume.  Neurological: Negative for syncope and headaches.  Hematological: Does not bruise/bleed easily.  Psychiatric/Behavioral: Positive for sleep disturbance. The patient is nervous/anxious.       BP 138/86  Pulse 89  Temp(Src) 98.6 F (37 C) (Oral)  Resp 16  Ht 5\' 7"  (1.702 m)  Wt 189 lb (85.73 kg)  BMI 29.59 kg/m2  SpO2 98%  LMP 07/25/2013 Objective:   Physical Exam  Constitutional: She is oriented to person, place, and time. She appears well-developed and well-nourished. No distress.  HENT:  Head: Normocephalic and atraumatic.  Right Ear: External ear normal.  Left Ear: External ear normal.  Eyes: Conjunctivae are normal. No scleral icterus.  Neck: Normal range of motion. Neck supple. No thyromegaly present.  Cardiovascular: Normal rate, regular rhythm, normal heart sounds and intact distal pulses.   Pulmonary/Chest: Effort normal and breath sounds normal. No respiratory distress.  Musculoskeletal: She exhibits no edema.  Lymphadenopathy:    She has no cervical adenopathy.  Neurological: She is alert and  oriented to person, place, and time.  Skin: Skin is warm and dry. She is not diaphoretic. No erythema.  Psychiatric: She has a normal mood and affect. Her behavior is normal.          Assessment & Plan:  Need for prophylactic vaccination and inoculation against influenza - Plan: Flu Vaccine QUAD 36+ mos IM  HYPERTENSION - BP high nml today but per pt better outside of office. Cont to monitor outside of office and increase lisinopril 20/12.5 to 20/25 if continues to run in 130s/high 80s.  Screening breast examination - Plan: MM Digital Screening  Anxiety - refilled prn xanax. Stable on citalopram  FamHx of CVA - recommend continuing to optimize BP and lipids but no brain imaging indicated at this time.  Meds ordered this encounter  Medications  . lisinopril-hydrochlorothiazide (PRINZIDE,ZESTORETIC) 20-12.5 MG per tablet    Sig: Take 1 tablet by mouth daily.    Dispense:  90 tablet    Refill:  3  . citalopram (CELEXA) 20 MG tablet    Sig: Take 1 tablet (20 mg total) by mouth daily.    Dispense:  90 tablet    Refill:  3  . DISCONTD: ALPRAZolam (XANAX) 0.5 MG tablet    Sig: Take 1 tablet (0.5 mg total) by mouth at bedtime as needed for sleep or anxiety. Needs OV before any further refills.    Dispense:  30 tablet    Refill:  5  . ALPRAZolam (XANAX) 0.5 MG tablet    Sig: Take 1 tablet (0.5 mg total) by mouth at bedtime as needed for sleep or anxiety.    Dispense:  30 tablet    Refill:  5

## 2013-09-20 ENCOUNTER — Ambulatory Visit (INDEPENDENT_AMBULATORY_CARE_PROVIDER_SITE_OTHER): Payer: BC Managed Care – PPO | Admitting: Internal Medicine

## 2013-09-20 VITALS — BP 130/80 | HR 111 | Temp 98.4°F | Resp 18 | Ht 67.0 in | Wt 192.0 lb

## 2013-09-20 DIAGNOSIS — J209 Acute bronchitis, unspecified: Secondary | ICD-10-CM

## 2013-09-20 DIAGNOSIS — R05 Cough: Secondary | ICD-10-CM

## 2013-09-20 MED ORDER — HYDROCODONE-ACETAMINOPHEN 7.5-325 MG/15ML PO SOLN: 5.0000 mL | Freq: Four times a day (QID) | ORAL | Status: DC | PRN

## 2013-09-20 MED ORDER — HYDROCODONE-ACETAMINOPHEN 5-325 MG PO TABS: 1.0000 | ORAL_TABLET | Freq: Four times a day (QID) | ORAL | Status: DC | PRN

## 2013-09-20 MED ORDER — AZITHROMYCIN 250 MG PO TABS: ORAL_TABLET | ORAL | Status: DC

## 2013-09-20 MED ORDER — BENZONATATE 200 MG PO CAPS: 200.0000 mg | ORAL_CAPSULE | Freq: Two times a day (BID) | ORAL | Status: DC | PRN

## 2013-09-20 MED ORDER — ALBUTEROL SULFATE HFA 108 (90 BASE) MCG/ACT IN AERS: 2.0000 | INHALATION_SPRAY | Freq: Four times a day (QID) | RESPIRATORY_TRACT | Status: DC | PRN

## 2013-09-20 NOTE — Progress Notes (Signed)
  Subjective:    Patient ID: Stacy Hull, female    DOB: [birth year], 42 y.o.   MRN: [id number]  Patient comes into our office with sinus pressuer that's been going on for 2 weeks now  Cough This is a new problem. The current episode started 1 to 4 weeks ago. The problem has been gradually worsening. The problem occurs constantly. The cough is productive of brown sputum (brown to green sputum). Associated symptoms include chills, headaches, postnasal drip, a sore throat, shortness of breath and wheezing. Pertinent negatives include no ear pain. Risk factors for lung disease include travel (to [place] 2 weeks ago start feeling sinus pressure). Treatments tried: inhaler Albuterol and nasal spray. The treatment provided no relief.       Review of Systems  Constitutional: Positive for chills and fatigue.  HENT: Positive for congestion, postnasal drip, sinus pressure, sore throat and voice change. Negative for ear pain and trouble swallowing.   Respiratory: Positive for cough, chest tightness, shortness of breath and wheezing.   Gastrointestinal: Negative for nausea and vomiting.  Neurological: Positive for headaches. Negative for dizziness.       Objective:   Physical Exam  Constitutional: She is oriented to person, place, and time. She appears well-developed and well-nourished.  HENT:  Right Ear: External ear normal.  Left Ear: External ear normal.  Nose: Mucosal edema, rhinorrhea and sinus tenderness present. Right sinus exhibits maxillary sinus tenderness. Left sinus exhibits maxillary sinus tenderness.  Mouth/Throat: Oropharynx is clear and moist.  Eyes: EOM are normal. Pupils are equal, round, and reactive to light.  Neck: Neck supple.  Pulmonary/Chest: Effort normal. Not tachypneic. No respiratory distress. She has wheezes. She has rhonchi.  Musculoskeletal: Normal range of motion.  Lymphadenopathy:    She has no cervical adenopathy.  Neurological: She is alert and oriented  to person, place, and time. No cranial nerve deficit. She exhibits normal muscle tone. Coordination normal.  Psychiatric: She has a normal mood and affect. Her behavior is normal. Judgment and thought content normal.          Assessment & Plan:  Bronchitis Lortab/Zpak/Albuterol

## 2013-09-20 NOTE — Patient Instructions (Signed)

## 2013-09-20 NOTE — Progress Notes (Signed)
  Subjective:    Patient ID: Stacy Hull, female    DOB: Jun 14, 1971, 42 y.o.   MRN: [id number]  HPI    Review of Systems     Objective:   Physical Exam        Assessment & Plan:

## 2013-10-16 ENCOUNTER — Other Ambulatory Visit: Payer: Self-pay | Admitting: Family Medicine

## 2013-11-16 ENCOUNTER — Ambulatory Visit: Admission: RE | Admit: 2013-11-16 | Payer: BC Managed Care – PPO | Source: Ambulatory Visit

## 2013-11-16 ENCOUNTER — Other Ambulatory Visit: Payer: Self-pay | Admitting: Family Medicine

## 2013-11-16 ENCOUNTER — Ambulatory Visit
Admission: RE | Admit: 2013-11-16 | Discharge: 2013-11-16 | Disposition: A | Discharge status: Home / Self Care | Payer: BC Managed Care – PPO | Source: Ambulatory Visit | Attending: Family Medicine | Admitting: Family Medicine

## 2013-11-16 DIAGNOSIS — Z1231 Encounter for screening mammogram for malignant neoplasm of breast: Secondary | ICD-10-CM

## 2014-03-27 ENCOUNTER — Other Ambulatory Visit: Payer: Self-pay | Admitting: Family Medicine

## 2014-03-28 NOTE — Telephone Encounter (Signed)
Faxed

## 2014-05-16 ENCOUNTER — Telehealth: Payer: Self-pay

## 2014-05-16 NOTE — Telephone Encounter (Signed)
PT STATES HER HUSBAND AND HER CHILDREN HAVE STREP, WOULD LIKE TO HAVE THE DR CALL HER SOMETHING IN SINCE SHE IS HAVING THE SAME SYMPTOMS AS THEY PLEASE CALL PT AT [place] AND [address]

## 2014-05-17 NOTE — Telephone Encounter (Signed)
LM for pt advising to RTC.

## 2014-05-17 NOTE — Telephone Encounter (Signed)
States that husband was seen at [facility] urgent care and he has strep throat.  The children were seen at their pediatricians office.  I advised patient that she would probably need to be seen but she urged me to send a message to a provider anyway to see if something could be called in for her.  She is having sore throat and a cough.

## 2014-05-17 NOTE — Telephone Encounter (Signed)
She should be seen. We last saw here >6 months ago.

## 2014-07-28 ENCOUNTER — Ambulatory Visit (INDEPENDENT_AMBULATORY_CARE_PROVIDER_SITE_OTHER): Payer: BC Managed Care – PPO

## 2014-07-28 ENCOUNTER — Ambulatory Visit (INDEPENDENT_AMBULATORY_CARE_PROVIDER_SITE_OTHER): Payer: BC Managed Care – PPO | Admitting: Internal Medicine

## 2014-07-28 VITALS — BP 138/100 | HR 80 | Temp 98.3°F | Resp 18 | Ht 66.0 in | Wt 192.2 lb

## 2014-07-28 DIAGNOSIS — M79675 Pain in left toe(s): Secondary | ICD-10-CM

## 2014-07-28 DIAGNOSIS — M79609 Pain in unspecified limb: Secondary | ICD-10-CM

## 2014-07-28 DIAGNOSIS — S90122A Contusion of left lesser toe(s) without damage to nail, initial encounter: Secondary | ICD-10-CM

## 2014-07-28 DIAGNOSIS — S90129A Contusion of unspecified lesser toe(s) without damage to nail, initial encounter: Secondary | ICD-10-CM

## 2014-07-28 NOTE — Patient Instructions (Signed)

## 2014-07-28 NOTE — Progress Notes (Signed)
   Subjective:    Patient ID: Stacy Hull, female    DOB: 22-May-1971, 43 y.o.   MRN: [id number]  HPI 43 yr old Caucasian female is today with complaints of left 4 th toe pain. She states she collided with her husband coming in a direction in her home. She states a few days later she dropped a vase on it as well.  Worried that it is deformed and distal joint is floppy.   Review of Systems     Objective:   Physical Exam  Constitutional: She is oriented to person, place, and time. She appears well-developed and well-nourished. No distress.  HENT:  Head: Normocephalic.  Eyes: Conjunctivae and EOM are normal. Pupils are equal, round, and reactive to light.  Neck: Normal range of motion.  Pulmonary/Chest: Effort normal.  Musculoskeletal:       Left foot: She exhibits decreased range of motion, tenderness, bony tenderness, swelling and deformity. She exhibits normal capillary refill, no crepitus and no laceration.       Feet:  Distal 4th toe is floppy, different from other toes  Neurological: She is alert and oriented to person, place, and time. She exhibits normal muscle tone. Coordination normal.  Skin: No erythema.  Psychiatric: She has a normal mood and affect. Her behavior is normal.  NMSV intact   [facility] reading (PRIMARY) by  Dr.Nyle Limb no fx seen, 4th distal phalanx and middle phalanx appears lucent       Assessment & Plan:  Pain and deformity 4th toe Refer to ortho/Post op shoe

## 2014-09-05 ENCOUNTER — Ambulatory Visit (INDEPENDENT_AMBULATORY_CARE_PROVIDER_SITE_OTHER): Payer: BC Managed Care – PPO | Admitting: Family Medicine

## 2014-09-05 ENCOUNTER — Ambulatory Visit (INDEPENDENT_AMBULATORY_CARE_PROVIDER_SITE_OTHER): Payer: BC Managed Care – PPO

## 2014-09-05 VITALS — BP 122/70 | HR 92 | Temp 98.8°F | Resp 18 | Ht 67.5 in | Wt 194.0 lb

## 2014-09-05 DIAGNOSIS — R112 Nausea with vomiting, unspecified: Secondary | ICD-10-CM

## 2014-09-05 DIAGNOSIS — Z8709 Personal history of other diseases of the respiratory system: Secondary | ICD-10-CM

## 2014-09-05 DIAGNOSIS — R0602 Shortness of breath: Secondary | ICD-10-CM

## 2014-09-05 DIAGNOSIS — R062 Wheezing: Secondary | ICD-10-CM

## 2014-09-05 DIAGNOSIS — R059 Cough, unspecified: Secondary | ICD-10-CM

## 2014-09-05 DIAGNOSIS — J029 Acute pharyngitis, unspecified: Secondary | ICD-10-CM

## 2014-09-05 DIAGNOSIS — J209 Acute bronchitis, unspecified: Secondary | ICD-10-CM

## 2014-09-05 DIAGNOSIS — J22 Unspecified acute lower respiratory infection: Secondary | ICD-10-CM

## 2014-09-05 DIAGNOSIS — R05 Cough: Secondary | ICD-10-CM

## 2014-09-05 DIAGNOSIS — J988 Other specified respiratory disorders: Secondary | ICD-10-CM

## 2014-09-05 LAB — POCT CBC
GRANULOCYTE PERCENT: 70.8 % (ref 37–80)
HCT, POC: 33.2 % — AB (ref 37.7–47.9)
Hemoglobin: 10.5 g/dL — AB (ref 12.2–16.2)
Lymph, poc: 2.6 (ref 0.6–3.4)
MCH, POC: 30.3 pg (ref 27–31.2)
MCHC: 31.5 g/dL — AB (ref 31.8–35.4)
MCV: 96.3 fL (ref 80–97)
MID (cbc): 1.1 — AB (ref 0–0.9)
MPV: 7.5 fL (ref 0–99.8)
POC GRANULOCYTE: 9 — AB (ref 2–6.9)
POC LYMPH PERCENT: 20.3 %L (ref 10–50)
POC MID %: 8.9 %M (ref 0–12)
Platelet Count, POC: 273 10*3/uL (ref 142–424)
RBC: 3.44 M/uL — AB (ref 4.04–5.48)
RDW, POC: 12.8 %
WBC: 12.7 10*3/uL — AB (ref 4.6–10.2)

## 2014-09-05 MED ORDER — ALBUTEROL SULFATE (2.5 MG/3ML) 0.083% IN NEBU
2.5000 mg | INHALATION_SOLUTION | Freq: Once | RESPIRATORY_TRACT | Status: AC
  Administered 2014-09-05: 2.5 mg via RESPIRATORY_TRACT

## 2014-09-05 MED ORDER — ALBUTEROL SULFATE HFA 108 (90 BASE) MCG/ACT IN AERS: 1.0000 | INHALATION_SPRAY | RESPIRATORY_TRACT | Status: DC | PRN

## 2014-09-05 MED ORDER — AZITHROMYCIN 250 MG PO TABS: ORAL_TABLET | ORAL | Status: DC

## 2014-09-05 NOTE — Patient Instructions (Signed)
Albuterol every 4 hours as needed for wheezing. Start antibiotic.  Recheck in next 2 days of wheezing not improving, persistent fevers, or any worsening. Return to the clinic or go to the nearest emergency room if any of your symptoms worsen or new symptoms occur.  Acute Bronchitis Bronchitis is inflammation of the airways that extend from the windpipe into the lungs (bronchi). The inflammation often causes mucus to develop. This leads to a cough, which is the most common symptom of bronchitis.  In acute bronchitis, the condition usually develops suddenly and goes away over time, usually in a couple weeks. Smoking, allergies, and asthma can make bronchitis worse. Repeated episodes of bronchitis may cause further lung problems.  CAUSES Acute bronchitis is most often caused by the same virus that causes a cold. The virus can spread from person to person (contagious) through coughing, sneezing, and touching contaminated objects. SIGNS AND SYMPTOMS   Cough.   Fever.   Coughing up mucus.   Body aches.   Chest congestion.   Chills.   Shortness of breath.   Sore throat.  DIAGNOSIS  Acute bronchitis is usually diagnosed through a physical exam. Your health care provider will also ask you questions about your medical history. Tests, such as chest X-rays, are sometimes done to rule out other conditions.  TREATMENT  Acute bronchitis usually goes away in a couple weeks. Oftentimes, no medical treatment is necessary. Medicines are sometimes given for relief of fever or cough. Antibiotic medicines are usually not needed but may be prescribed in certain situations. In some cases, an inhaler may be recommended to help reduce shortness of breath and control the cough. A cool mist vaporizer may also be used to help thin bronchial secretions and make it easier to clear the chest.  HOME CARE INSTRUCTIONS  Get plenty of rest.   Drink enough fluids to keep your urine clear or pale yellow (unless  you have a medical condition that requires fluid restriction). Increasing fluids may help thin your respiratory secretions (sputum) and reduce chest congestion, and it will prevent dehydration.   Take medicines only as directed by your health care provider.  If you were prescribed an antibiotic medicine, finish it all even if you start to feel better.  Avoid smoking and secondhand smoke. Exposure to cigarette smoke or irritating chemicals will make bronchitis worse. If you are a smoker, consider using nicotine gum or skin patches to help control withdrawal symptoms. Quitting smoking will help your lungs heal faster.   Reduce the chances of another bout of acute bronchitis by washing your hands frequently, avoiding people with cold symptoms, and trying not to touch your hands to your mouth, nose, or eyes.   Keep all follow-up visits as directed by your health care provider.  SEEK MEDICAL CARE IF: Your symptoms do not improve after 1 week of treatment.  SEEK IMMEDIATE MEDICAL CARE IF:  You develop an increased fever or chills.   You have chest pain.   You have severe shortness of breath.  You have bloody sputum.   You develop dehydration.  You faint or repeatedly feel like you are going to pass out.  You develop repeated vomiting.  You develop a severe headache. MAKE SURE YOU:   Understand these instructions.  Will watch your condition.  Will get help right away if you are not doing well or get worse. Document Released: 12/25/2004 Document Revised: 04/03/2014 Document Reviewed: 05/10/2013 [facility] Patient Information 2015 [place], [place]. This information is not intended to  replace advice given to you by your health care provider. Make sure you discuss any questions you have with your health care provider.   Bronchospasm A bronchospasm is when the tubes that carry air in and out of your lungs (airways) spasm or tighten. During a bronchospasm it is hard to breathe. This  is because the airways get smaller. A bronchospasm can be triggered by:  Allergies. These may be to animals, pollen, food, or mold.  Infection. This is a common cause of bronchospasm.  Exercise.  Irritants. These include pollution, cigarette smoke, strong odors, aerosol sprays, and paint fumes.  Weather changes.  Stress.  Being emotional. HOME CARE   Always have a plan for getting help. Know when to call your doctor and local emergency services (911 in the [place] Know where you can get emergency care.  Only take medicines as told by your doctor.  If you were prescribed an inhaler or nebulizer machine, ask your doctor how to use it correctly. Always use a spacer with your inhaler if you were given one.  Stay calm during an attack. Try to relax and breathe more slowly.  Control your home environment:  Change your heating and air conditioning filter at least once a month.  Limit your use of fireplaces and wood stoves.  Do not  smoke. Do not  allow smoking in your home.  Avoid perfumes and fragrances.  Get rid of pests (such as roaches and mice) and their droppings.  Throw away plants if you see mold on them.  Keep your house clean and dust free.  Replace carpet with wood, tile, or vinyl flooring. Carpet can trap dander and dust.  Use allergy-proof pillows, mattress covers, and box spring covers.  Wash bed sheets and blankets every week in hot water. Dry them in a dryer.  Use blankets that are made of polyester or cotton.  Wash hands frequently. GET HELP IF:  You have muscle aches.  You have chest pain.  The thick spit you spit or cough up (sputum) changes from clear or white to yellow, green, gray, or bloody.  The thick spit you spit or cough up gets thicker.  There are problems that may be related to the medicine you are given such as:  A rash.  Itching.  Swelling.  Trouble breathing. GET HELP RIGHT AWAY IF:  You feel you cannot breathe or catch  your breath.  You cannot stop coughing.  Your treatment is not helping you breathe better.  You have very bad chest pain. MAKE SURE YOU:   Understand these instructions.  Will watch your condition.  Will get help right away if you are not doing well or get worse. Document Released: 09/14/2009 Document Revised: 11/22/2013 Document Reviewed: 05/10/2013 [facility] Patient Information 2015 [place], [place]. This information is not intended to replace advice given to you by your health care provider. Make sure you discuss any questions you have with your health care provider. Cough, Adult  A cough is a reflex that helps clear your throat and airways. It can help heal the body or may be a reaction to an irritated airway. A cough may only last 2 or 3 weeks (acute) or may last more than 8 weeks (chronic).  CAUSES Acute cough:  Viral or bacterial infections. Chronic cough:  Infections.  Allergies.  Asthma.  Post-nasal drip.  Smoking.  Heartburn or acid reflux.  Some medicines.  Chronic lung problems (COPD).  Cancer. SYMPTOMS   Cough.  Fever.  Chest pain.  Increased breathing rate.  High-pitched whistling sound when breathing (wheezing).  Colored mucus that you cough up (sputum). TREATMENT   A bacterial cough may be treated with antibiotic medicine.  A viral cough must run its course and will not respond to antibiotics.  Your caregiver may recommend other treatments if you have a chronic cough. HOME CARE INSTRUCTIONS   Only take over-the-counter or prescription medicines for pain, discomfort, or fever as directed by your caregiver. Use cough suppressants only as directed by your caregiver.  Use a cold steam vaporizer or humidifier in your bedroom or home to help loosen secretions.  Sleep in a semi-upright position if your cough is worse at night.  Rest as needed.  Stop smoking if you smoke. SEEK IMMEDIATE MEDICAL CARE IF:   You have pus in your  sputum.  Your cough starts to worsen.  You cannot control your cough with suppressants and are losing sleep.  You begin coughing up blood.  You have difficulty breathing.  You develop pain which is getting worse or is uncontrolled with medicine.  You have a fever. MAKE SURE YOU:   Understand these instructions.  Will watch your condition.  Will get help right away if you are not doing well or get worse. Document Released: 05/16/2011 Document Revised: 02/09/2012 Document Reviewed: 05/16/2011 [facility] Patient Information 2015 [place], [place]. This information is not intended to replace advice given to you by your health care provider. Make sure you discuss any questions you have with your health care provider.

## 2014-09-05 NOTE — Progress Notes (Signed)
Subjective:    Patient ID: Stacy Hull, female    DOB: 09/19/1971, 43 y.o.   MRN: [id number]  HPI Stacy Hull is a 43 y.o. female Started with sore throat then some vomiting about 5 days ago.  Cough then started - thick brown mucus. Yesterday evening- shortness of breath, wheezing, crackling in breathing.   Hx of asthma, but has not had to use inhaler in years. Low grade hot and cold feeling, temp 1010 yesterday, 100 this am. No choking or known aspiration when vomiting 5 days ago. Slight loose stool on occasion.   Tx: son's albuterol nebulizer last night- helped some for few hours, but felt jittery. Does not have up to date albuterol prescription. Alka seltzer cold and sinus yesterday.    Patient Active Problem List   Diagnosis Date Noted  . ANXIETY 09/27/2007  . HYPERLIPIDEMIA 03/18/2007  . DEPRESSION 03/18/2007  . HYPERTENSION 03/18/2007   Past Medical History  Diagnosis Date  . Hyperlipidemia   . HTN (hypertension)   . Anxiety   . Allergic rhinitis   . Depression    Past Surgical History  Procedure Laterality Date  . Cesarean section  2005  . Parathyroidectomy  1999    benign  . Cervical cone biopsy    . Tubal ligation    . Hysteroscopy with novasure  11/26/2012    Procedure: HYSTEROSCOPY WITH NOVASURE;  Surgeon: Marvene Staff, MD;  Location: [facility];  Service: Gynecology;  Laterality: N/A;   Allergies  Allergen Reactions  . Erythromycin Other (See Comments)    G I upset.   Prior to Admission medications   Medication Sig Start Date End Date Taking? Authorizing Provider  ALPRAZolam Duanne Moron) 0.5 MG tablet TAKE 1 TABLET BY MOUTH AT BEDTIME AS NEEDED FOR SLEEP OR ANXIETY 03/27/14  Yes Shawnee Knapp, MD  citalopram (CELEXA) 20 MG tablet Take 1 tablet (20 mg total) by mouth daily. 08/26/13  Yes Shawnee Knapp, MD  lisinopril-hydrochlorothiazide (PRINZIDE,ZESTORETIC) 20-12.5 MG per tablet Take 1 tablet by mouth daily. 08/26/13  Yes Shawnee Knapp, MD  Multiple  Vitamin (MULTIVITAMIN) tablet Take 1 tablet by mouth daily.   Yes Historical Provider, MD  albuterol (PROVENTIL HFA;VENTOLIN HFA) 108 (90 BASE) MCG/ACT inhaler Inhale 2 puffs into the lungs every 6 (six) hours as needed for wheezing. 09/20/13   Orma Flaming, MD  azithromycin [facility]) 250 MG tablet Use as directed 09/20/13   Orma Flaming, MD  benzonatate (TESSALON) 200 MG capsule Take 1 capsule (200 mg total) by mouth 2 (two) times daily as needed for cough. 09/20/13   Orma Flaming, MD  HYDROcodone-acetaminophen (HYCET) 7.5-325 mg/15 ml solution Take 5 mLs by mouth every 6 (six) hours as needed for pain (or cough). 09/20/13   Orma Flaming, MD   History   Social History  . Marital Status: Married    Spouse Name: Safiyyah Vasconez    Number of Children: 2  . Years of Education: Master's +   Occupational History  . [employer]   Social History Main Topics  . Smoking status: Never Smoker   . Smokeless tobacco: Never Used  . Alcohol Use: 2.0 oz/week    4 drink(s) per week     Comment: occasionally  . Drug Use: No  . Sexual Activity: Yes    Partners: Male   Other Topics Concern  . Not on file  Social History Narrative   Lives with her husband and their 2 children. Walks 3x's weekly for exercise.       Review of Systems  Constitutional: Positive for fever and chills.  Respiratory: Positive for cough and shortness of breath.   Cardiovascular: Negative for leg swelling.  Gastrointestinal: Positive for nausea, vomiting and diarrhea. Negative for blood in stool.       Objective:   Physical Exam  Vitals reviewed. Constitutional: She is oriented to person, place, and time. She appears well-developed and well-nourished. No distress.  HENT:  Head: Normocephalic and atraumatic.  Right Ear: Hearing, tympanic membrane, external ear and ear canal normal.  Left Ear: Hearing, tympanic membrane, external ear and ear  canal normal.  Nose: Nose normal.  Mouth/Throat: Oropharynx is clear and moist. No oropharyngeal exudate.  Eyes: Conjunctivae and EOM are normal. Pupils are equal, round, and reactive to light.  Cardiovascular: Normal rate, regular rhythm, normal heart sounds and intact distal pulses.   No murmur heard. Pulmonary/Chest: Effort normal. No respiratory distress. She has wheezes. She has no rhonchi.  Diffuse upper wheeze with few coarse breath sounds RLL.  Abdominal: Soft. She exhibits no distension. There is no tenderness.  Neurological: She is alert and oriented to person, place, and time.  Skin: Skin is warm and dry. No rash noted.  Psychiatric: She has a normal mood and affect. Her behavior is normal.   Filed Vitals:   09/05/14 0821  BP: 122/70  Pulse: 92  Temp: 98.8 F (37.1 C)  TempSrc: Oral  Resp: 18  Height: 5' 7.5" (1.715 m)  Weight: 194 lb (87.998 kg)  SpO2: 96%   [facility] reading (PRIMARY) by  Dr. Carlota Raspberry, CXR: scattered increased bronchial markings, RLL greater than LLL No discrete infiltrate.  Results for orders placed in visit on 09/05/14  POCT CBC      Result Value Ref Range   WBC 12.7 (*) 4.6 - 10.2 K/uL   Lymph, poc 2.6  0.6 - 3.4   POC LYMPH PERCENT 20.3  10 - 50 %L   MID (cbc) 1.1 (*) 0 - 0.9   POC MID % 8.9  0 - 12 %M   POC Granulocyte 9.0 (*) 2 - 6.9   Granulocyte percent 70.8  37 - 80 %G   RBC 3.44 (*) 4.04 - 5.48 M/uL   Hemoglobin 10.5 (*) 12.2 - 16.2 g/dL   HCT, POC 33.2 (*) 37.7 - 47.9 %   MCV 96.3  80 - 97 fL   MCH, POC 30.3  27 - 31.2 pg   MCHC 31.5 (*) 31.8 - 35.4 g/dL   RDW, POC 12.8     Platelet Count, POC 273  142 - 424 K/uL   MPV 7.5  0 - 99.8 fL   8:45 AM Albuterol 2.5mg  neb given. Cleared wheeze, symptomatically improved.     Assessment & Plan:   Stacy Hull is a 43 y.o. female Cough - Plan: POCT CBC, DG Chest 2 View, albuterol (PROVENTIL) (2.5 MG/3ML) 0.083% nebulizer solution 2.5 mg  Shortness of breath - Plan: POCT CBC, DG  Chest 2 View, albuterol (PROVENTIL) (2.5 MG/3ML) 0.083% nebulizer solution 2.5 mg, albuterol (PROVENTIL HFA;VENTOLIN HFA) 108 (90 BASE) MCG/ACT inhaler  Acute pharyngitis, unspecified pharyngitis type - Plan: POCT CBC, DG Chest 2 View, albuterol (PROVENTIL) (2.5 MG/3ML) 0.083% nebulizer solution 2.5 mg  Wheezing - Plan: POCT CBC, DG Chest 2 View, albuterol (PROVENTIL) (2.5 MG/3ML) 0.083% nebulizer solution 2.5 mg, albuterol (PROVENTIL HFA;VENTOLIN HFA) 108 (  90 BASE) MCG/ACT inhaler  Personal history of asthma - Plan: POCT CBC, DG Chest 2 View, albuterol (PROVENTIL) (2.5 MG/3ML) 0.083% nebulizer solution 2.5 mg  Nausea and vomiting, vomiting of unspecified type - Plan: POCT CBC, DG Chest 2 View, albuterol (PROVENTIL) (2.5 MG/3ML) 0.083% nebulizer solution 2.5 mg  Bronchospasm with bronchitis, acute - Plan: azithromycin (ZITHROMAX) 250 MG tablet  LRTI (lower respiratory tract infection) - Plan: azithromycin (ZITHROMAX) 250 MG tablet   Suspected initial viral illness, with secondary wheeze, fever, and borderline leukocytosis - early CAP vs. LRTI/bronchiits and bronchospasm  -improved with albuterol neb.   -start Zpak - risks of QT prolongation and rtc precautions discussed.   -albuterol HFA Q4h prn.   -recheck in 2 days if not improving, sooner if worse (RTC/ER precautions discussed).   Meds ordered this encounter  Medications  . albuterol (PROVENTIL) (2.5 MG/3ML) 0.083% nebulizer solution 2.5 mg    Sig:   . albuterol (PROVENTIL HFA;VENTOLIN HFA) 108 (90 BASE) MCG/ACT inhaler    Sig: Inhale 1-2 puffs into the lungs every 4 (four) hours as needed for wheezing or shortness of breath.    Dispense:  1 Inhaler    Refill:  0  . azithromycin (ZITHROMAX) 250 MG tablet    Sig: Take 2 pills by mouth on day 1, then 1 pill by mouth per day on days 2 through 5.    Dispense:  6 tablet    Refill:  0   Patient Instructions  Albuterol every 4 hours as needed for wheezing. Start antibiotic.  Recheck in  next 2 days of wheezing not improving, persistent fevers, or any worsening. Return to the clinic or go to the nearest emergency room if any of your symptoms worsen or new symptoms occur.  Acute Bronchitis Bronchitis is inflammation of the airways that extend from the windpipe into the lungs (bronchi). The inflammation often causes mucus to develop. This leads to a cough, which is the most common symptom of bronchitis.  In acute bronchitis, the condition usually develops suddenly and goes away over time, usually in a couple weeks. Smoking, allergies, and asthma can make bronchitis worse. Repeated episodes of bronchitis may cause further lung problems.  CAUSES Acute bronchitis is most often caused by the same virus that causes a cold. The virus can spread from person to person (contagious) through coughing, sneezing, and touching contaminated objects. SIGNS AND SYMPTOMS   Cough.   Fever.   Coughing up mucus.   Body aches.   Chest congestion.   Chills.   Shortness of breath.   Sore throat.  DIAGNOSIS  Acute bronchitis is usually diagnosed through a physical exam. Your health care provider will also ask you questions about your medical history. Tests, such as chest X-rays, are sometimes done to rule out other conditions.  TREATMENT  Acute bronchitis usually goes away in a couple weeks. Oftentimes, no medical treatment is necessary. Medicines are sometimes given for relief of fever or cough. Antibiotic medicines are usually not needed but may be prescribed in certain situations. In some cases, an inhaler may be recommended to help reduce shortness of breath and control the cough. A cool mist vaporizer may also be used to help thin bronchial secretions and make it easier to clear the chest.  HOME CARE INSTRUCTIONS  Get plenty of rest.   Drink enough fluids to keep your urine clear or pale yellow (unless you have a medical condition that requires fluid restriction). Increasing  fluids may help thin your respiratory secretions (  sputum) and reduce chest congestion, and it will prevent dehydration.   Take medicines only as directed by your health care provider.  If you were prescribed an antibiotic medicine, finish it all even if you start to feel better.  Avoid smoking and secondhand smoke. Exposure to cigarette smoke or irritating chemicals will make bronchitis worse. If you are a smoker, consider using nicotine gum or skin patches to help control withdrawal symptoms. Quitting smoking will help your lungs heal faster.   Reduce the chances of another bout of acute bronchitis by washing your hands frequently, avoiding people with cold symptoms, and trying not to touch your hands to your mouth, nose, or eyes.   Keep all follow-up visits as directed by your health care provider.  SEEK MEDICAL CARE IF: Your symptoms do not improve after 1 week of treatment.  SEEK IMMEDIATE MEDICAL CARE IF:  You develop an increased fever or chills.   You have chest pain.   You have severe shortness of breath.  You have bloody sputum.   You develop dehydration.  You faint or repeatedly feel like you are going to pass out.  You develop repeated vomiting.  You develop a severe headache. MAKE SURE YOU:   Understand these instructions.  Will watch your condition.  Will get help right away if you are not doing well or get worse. Document Released: 12/25/2004 Document Revised: 04/03/2014 Document Reviewed: 05/10/2013 [facility] Patient Information 2015 [place], [place]. This information is not intended to replace advice given to you by your health care provider. Make sure you discuss any questions you have with your health care provider.   Bronchospasm A bronchospasm is when the tubes that carry air in and out of your lungs (airways) spasm or tighten. During a bronchospasm it is hard to breathe. This is because the airways get smaller. A bronchospasm can be triggered  by:  Allergies. These may be to animals, pollen, food, or mold.  Infection. This is a common cause of bronchospasm.  Exercise.  Irritants. These include pollution, cigarette smoke, strong odors, aerosol sprays, and paint fumes.  Weather changes.  Stress.  Being emotional. HOME CARE   Always have a plan for getting help. Know when to call your doctor and local emergency services (911 in the [place] Know where you can get emergency care.  Only take medicines as told by your doctor.  If you were prescribed an inhaler or nebulizer machine, ask your doctor how to use it correctly. Always use a spacer with your inhaler if you were given one.  Stay calm during an attack. Try to relax and breathe more slowly.  Control your home environment:  Change your heating and air conditioning filter at least once a month.  Limit your use of fireplaces and wood stoves.  Do not  smoke. Do not  allow smoking in your home.  Avoid perfumes and fragrances.  Get rid of pests (such as roaches and mice) and their droppings.  Throw away plants if you see mold on them.  Keep your house clean and dust free.  Replace carpet with wood, tile, or vinyl flooring. Carpet can trap dander and dust.  Use allergy-proof pillows, mattress covers, and box spring covers.  Wash bed sheets and blankets every week in hot water. Dry them in a dryer.  Use blankets that are made of polyester or cotton.  Wash hands frequently. GET HELP IF:  You have muscle aches.  You have chest pain.  The thick spit you spit  or cough up (sputum) changes from clear or white to yellow, green, gray, or bloody.  The thick spit you spit or cough up gets thicker.  There are problems that may be related to the medicine you are given such as:  A rash.  Itching.  Swelling.  Trouble breathing. GET HELP RIGHT AWAY IF:  You feel you cannot breathe or catch your breath.  You cannot stop coughing.  Your treatment is not  helping you breathe better.  You have very bad chest pain. MAKE SURE YOU:   Understand these instructions.  Will watch your condition.  Will get help right away if you are not doing well or get worse. Document Released: 09/14/2009 Document Revised: 11/22/2013 Document Reviewed: 05/10/2013 [facility] Patient Information 2015 [place], [place]. This information is not intended to replace advice given to you by your health care provider. Make sure you discuss any questions you have with your health care provider. Cough, Adult  A cough is a reflex that helps clear your throat and airways. It can help heal the body or may be a reaction to an irritated airway. A cough may only last 2 or 3 weeks (acute) or may last more than 8 weeks (chronic).  CAUSES Acute cough:  Viral or bacterial infections. Chronic cough:  Infections.  Allergies.  Asthma.  Post-nasal drip.  Smoking.  Heartburn or acid reflux.  Some medicines.  Chronic lung problems (COPD).  Cancer. SYMPTOMS   Cough.  Fever.  Chest pain.  Increased breathing rate.  High-pitched whistling sound when breathing (wheezing).  Colored mucus that you cough up (sputum). TREATMENT   A bacterial cough may be treated with antibiotic medicine.  A viral cough must run its course and will not respond to antibiotics.  Your caregiver may recommend other treatments if you have a chronic cough. HOME CARE INSTRUCTIONS   Only take over-the-counter or prescription medicines for pain, discomfort, or fever as directed by your caregiver. Use cough suppressants only as directed by your caregiver.  Use a cold steam vaporizer or humidifier in your bedroom or home to help loosen secretions.  Sleep in a semi-upright position if your cough is worse at night.  Rest as needed.  Stop smoking if you smoke. SEEK IMMEDIATE MEDICAL CARE IF:   You have pus in your sputum.  Your cough starts to worsen.  You cannot control your cough with  suppressants and are losing sleep.  You begin coughing up blood.  You have difficulty breathing.  You develop pain which is getting worse or is uncontrolled with medicine.  You have a fever. MAKE SURE YOU:   Understand these instructions.  Will watch your condition.  Will get help right away if you are not doing well or get worse. Document Released: 05/16/2011 Document Revised: 02/09/2012 Document Reviewed: 05/16/2011 [facility] Patient Information 2015 [place], [place]. This information is not intended to replace advice given to you by your health care provider. Make sure you discuss any questions you have with your health care provider.

## 2014-09-07 ENCOUNTER — Ambulatory Visit (INDEPENDENT_AMBULATORY_CARE_PROVIDER_SITE_OTHER): Payer: BC Managed Care – PPO | Admitting: Family Medicine

## 2014-09-07 ENCOUNTER — Ambulatory Visit: Payer: Self-pay

## 2014-09-07 VITALS — BP 138/88 | HR 116 | Temp 98.5°F | Resp 22 | Ht 67.0 in | Wt 190.4 lb

## 2014-09-07 DIAGNOSIS — Z8709 Personal history of other diseases of the respiratory system: Secondary | ICD-10-CM

## 2014-09-07 DIAGNOSIS — J4521 Mild intermittent asthma with (acute) exacerbation: Secondary | ICD-10-CM

## 2014-09-07 DIAGNOSIS — J209 Acute bronchitis, unspecified: Secondary | ICD-10-CM

## 2014-09-07 DIAGNOSIS — R0602 Shortness of breath: Secondary | ICD-10-CM

## 2014-09-07 DIAGNOSIS — R05 Cough: Secondary | ICD-10-CM

## 2014-09-07 DIAGNOSIS — R059 Cough, unspecified: Secondary | ICD-10-CM

## 2014-09-07 MED ORDER — ALBUTEROL SULFATE (2.5 MG/3ML) 0.083% IN NEBU
2.5000 mg | INHALATION_SOLUTION | Freq: Once | RESPIRATORY_TRACT | Status: AC
  Administered 2014-09-07: 2.5 mg via RESPIRATORY_TRACT

## 2014-09-07 MED ORDER — PREDNISONE 20 MG PO TABS: 40.0000 mg | ORAL_TABLET | Freq: Every day | ORAL | Status: DC

## 2014-09-07 NOTE — Progress Notes (Signed)
Subjective:  This chart was scribed for Wendie Agreste, MD by Ladene Artist, ED Scribe. The patient was seen in room 12. Patient's care was started at 12:09 PM.   Patient ID: Stacy Hull, female    DOB: August 22, 1971, 43 y.o.   MRN: [id number]  Chief Complaint  Patient presents with  . Follow-up    still experiencing SOB especially with exertion, tightness in lungs has improved. Using albuterol inhaler every 4 hours during the day  . Fatigue   HPI Stacy Hull is a 43 y.o. female   Pt was seen 2 days ago for sore throat that progressed to cough with thick, brown mucous. This processed to SOB and wheezing the night before. CXR without acute findings but WBC elevated at 12.7. Treated for early bronchitis vs CAP with z-pak, albuterol HFA every 4 hours as needed or to use neb at home. Here for follow-up.   Pt states that she tried to return to work yesterday but still reports SOB. Pt reports being winded with walking up stairs, getting her children dressed and talking on the phone. She reports associated wheezing, congestion, cough, rhinorrhea, light-headedness, fatigue. Cough wakes her up at night. Pt reports that sputum has been lighter in color. She denies fever. Pt has taken inhaler every 4 hours during the day which improves chest tightness. Last used inhaler at 7 AM today. She denies h/o DM other than gestational. Last blood glucose check was last fall; normal.  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   Patient Active Problem List   Diagnosis Date Noted  . ANXIETY 09/27/2007  . HYPERLIPIDEMIA 03/18/2007  . DEPRESSION 03/18/2007  . HYPERTENSION 03/18/2007   Past Medical History  Diagnosis Date  . Hyperlipidemia   . HTN (hypertension)   . Anxiety   . Allergic rhinitis   . Depression    Past Surgical History  Procedure Laterality Date  . Cesarean section  2005  . Parathyroidectomy  1999    benign  . Cervical cone biopsy    . Tubal ligation    . Hysteroscopy with  novasure  11/26/2012    Procedure: HYSTEROSCOPY WITH NOVASURE;  Surgeon: Marvene Staff, MD;  Location: [facility];  Service: Gynecology;  Laterality: N/A;   Allergies  Allergen Reactions  . Erythromycin Other (See Comments)    G I upset.   Prior to Admission medications   Medication Sig Start Date End Date Taking? Authorizing Provider  albuterol (PROVENTIL HFA;VENTOLIN HFA) 108 (90 BASE) MCG/ACT inhaler Inhale 1-2 puffs into the lungs every 4 (four) hours as needed for wheezing or shortness of breath. 09/05/14  Yes Wendie Agreste, MD  ALPRAZolam Duanne Moron) 0.5 MG tablet TAKE 1 TABLET BY MOUTH AT BEDTIME AS NEEDED FOR SLEEP OR ANXIETY 03/27/14  Yes Shawnee Knapp, MD  azithromycin (ZITHROMAX) 250 MG tablet Take 2 pills by mouth on day 1, then 1 pill by mouth per day on days 2 through 5. 09/05/14  Yes Wendie Agreste, MD  citalopram (CELEXA) 20 MG tablet Take 1 tablet (20 mg total) by mouth daily. 08/26/13  Yes Shawnee Knapp, MD  lisinopril-hydrochlorothiazide (PRINZIDE,ZESTORETIC) 20-12.5 MG per tablet Take 1 tablet by mouth daily. 08/26/13  Yes Shawnee Knapp, MD  Multiple Vitamin (MULTIVITAMIN) tablet Take 1 tablet by mouth daily.   Yes Historical Provider, MD  benzonatate (TESSALON) 200 MG capsule Take 1 capsule (200 mg total) by mouth 2 (two) times daily as needed for cough. 09/20/13   Orma Flaming,  MD  HYDROcodone-acetaminophen (HYCET) 7.5-325 mg/15 ml solution Take 5 mLs by mouth every 6 (six) hours as needed for pain (or cough). 09/20/13   Orma Flaming, MD   History   Social History  . Marital Status: Married    Spouse Name: Milisa Kimbell    Number of Children: 2  . Years of Education: Master's +   Occupational History  . [employer]   Social History Main Topics  . Smoking status: Never Smoker   . Smokeless tobacco: Never Used  . Alcohol Use: 2.0 oz/week    4 drink(s) per week     Comment: occasionally  . Drug Use: No    . Sexual Activity: Yes    Partners: Male   Other Topics Concern  . Not on file   Social History Narrative   Lives with her husband and their 2 children. Walks 3x's weekly for exercise.   Review of Systems  Constitutional: Positive for fatigue. Negative for fever and unexpected weight change.  HENT: Positive for congestion and rhinorrhea.   Respiratory: Positive for cough, chest tightness, shortness of breath and wheezing.   Cardiovascular: Negative for chest pain, palpitations and leg swelling.  Gastrointestinal: Negative for abdominal pain and blood in stool.  Neurological: Positive for light-headedness. Negative for dizziness, syncope and headaches.      Objective:   Physical Exam  Vitals reviewed. Constitutional: She is oriented to person, place, and time. She appears well-developed and well-nourished.  HENT:  Head: Normocephalic and atraumatic.  Eyes: Conjunctivae and EOM are normal. Pupils are equal, round, and reactive to light.  Neck: Carotid bruit is not present.  Cardiovascular: Normal rate, regular rhythm, normal heart sounds and intact distal pulses.  Exam reveals no gallop and no friction rub.   No murmur heard. Pulmonary/Chest: Effort normal. She has wheezes.  Diffuse expiratory wheezing. Speaking in full sentences. No retractions.  Abdominal: Soft. She exhibits no pulsatile midline mass. There is no tenderness.  Musculoskeletal: She exhibits no edema.  Neurological: She is alert and oriented to person, place, and time.  Skin: Skin is warm and dry.  Psychiatric: She has a normal mood and affect. Her behavior is normal.   Filed Vitals:   09/07/14 1106  BP: 138/88  Pulse: 91  Temp: 98.5 F (36.9 C)  TempSrc: Oral  Resp: 22  Height: 5\' 7"  (1.702 m)  Weight: 190 lb 6.4 oz (86.365 kg)  SpO2: 97%  albuterol 2.5mg  neb in office.  Peak flow approx 90% predicted. Improved aeration.     Assessment & Plan:   Stacy Hull is a 43 y.o. female Personal  history of asthma - Plan: albuterol (PROVENTIL) (2.5 MG/3ML) 0.083% nebulizer solution 2.5 mg, CANCELED: DG Chest 2 View  Bronchospasm with bronchitis, acute - Plan: albuterol (PROVENTIL) (2.5 MG/3ML) 0.083% nebulizer solution 2.5 mg, CANCELED: DG Chest 2 View  Cough - Plan: albuterol (PROVENTIL) (2.5 MG/3ML) 0.083% nebulizer solution 2.5 mg, CANCELED: DG Chest 2 View  Shortness of breath - Plan: albuterol (PROVENTIL) (2.5 MG/3ML) 0.083% nebulizer solution 2.5 mg, CANCELED: DG Chest 2 View  Asthmatic bronchitis, mild intermittent, with acute exacerbation - Plan: predniSONE (DELTASONE) 20 MG tablet  Persistent asthmatic bronchitis, and need for albuterol.  Afebrile.  Will start prednisone taper. Cont nebs and zpak, rtc precautions.    Meds ordered this encounter  Medications  . albuterol (PROVENTIL) (2.5 MG/3ML) 0.083% nebulizer solution 2.5 mg  Sig:   . predniSONE (DELTASONE) 20 MG tablet    Sig: Take 2 tablets (40 mg total) by mouth daily with breakfast.    Dispense:  10 tablet    Refill:  0   Patient Instructions  Start prednisone, continue albuterol inhaler up to every 4 hours as needed. Continue antibiotic as prescribed.  If not improving in next 2 days - recheck with other provider.  Return to the clinic or go to the nearest emergency room if any of your symptoms worsen or new symptoms occur. Asthma, Acute Bronchospasm Acute bronchospasm caused by asthma is also referred to as an asthma attack. Bronchospasm means your air passages become narrowed. The narrowing is caused by inflammation and tightening of the muscles in the air tubes (bronchi) in your lungs. This can make it hard to breathe or cause you to wheeze and cough. CAUSES Possible triggers are:  Animal dander from the skin, hair, or feathers of animals.  Dust mites contained in house dust.  Cockroaches.  Pollen from trees or grass.  Mold.  Cigarette or tobacco smoke.  Air pollutants such as dust, household  cleaners, hair sprays, aerosol sprays, paint fumes, strong chemicals, or strong odors.  Cold air or weather changes. Cold air may trigger inflammation. Winds increase molds and pollens in the air.  Strong emotions such as crying or laughing hard.  Stress.  Certain medicines such as aspirin or beta-blockers.  Sulfites in foods and drinks, such as dried fruits and wine.  Infections or inflammatory conditions, such as a flu, cold, or inflammation of the nasal membranes (rhinitis).  Gastroesophageal reflux disease (GERD). GERD is a condition where stomach acid backs up into your esophagus.  Exercise or strenuous activity. SIGNS AND SYMPTOMS   Wheezing.  Excessive coughing, particularly at night.  Chest tightness.  Shortness of breath. DIAGNOSIS  Your health care provider will ask you about your medical history and perform a physical exam. A chest X-ray or blood testing may be performed to look for other causes of your symptoms or other conditions that may have triggered your asthma attack. TREATMENT  Treatment is aimed at reducing inflammation and opening up the airways in your lungs. Most asthma attacks are treated with inhaled medicines. These include quick relief or rescue medicines (such as bronchodilators) and controller medicines (such as inhaled corticosteroids). These medicines are sometimes given through an inhaler or a nebulizer. Systemic steroid medicine taken by mouth or given through an IV tube also can be used to reduce the inflammation when an attack is moderate or severe. Antibiotic medicines are only used if a bacterial infection is present.  HOME CARE INSTRUCTIONS   Rest.  Drink plenty of liquids. This helps the mucus to remain thin and be easily coughed up. Only use caffeine in moderation and do not use alcohol until you have recovered from your illness.  Do not smoke. Avoid being exposed to secondhand smoke.  You play a critical role in keeping yourself in good  health. Avoid exposure to things that cause you to wheeze or to have breathing problems.  Keep your medicines up-to-date and available. Carefully follow your health care provider's treatment plan.  Take your medicine exactly as prescribed.  When pollen or pollution is bad, keep windows closed and use an air conditioner or go to places with air conditioning.  Asthma requires careful medical care. See your health care provider for a follow-up as advised. If you are more than [redacted] weeks pregnant and you were prescribed any  new medicines, let your obstetrician know about the visit and how you are doing. Follow up with your health care provider as directed.  After you have recovered from your asthma attack, make an appointment with your outpatient doctor to talk about ways to reduce the likelihood of future attacks. If you do not have a doctor who manages your asthma, make an appointment with a primary care doctor to discuss your asthma. SEEK IMMEDIATE MEDICAL CARE IF:   You are getting worse.  You have trouble breathing. If severe, call your local emergency services (911 in the [place]  You develop chest pain or discomfort.  You are vomiting.  You are not able to keep fluids down.  You are coughing up yellow, green, brown, or bloody sputum.  You have a fever and your symptoms suddenly get worse.  You have trouble swallowing. MAKE SURE YOU:   Understand these instructions.  Will watch your condition.  Will get help right away if you are not doing well or get worse. Document Released: 03/04/2007 Document Revised: 11/22/2013 Document Reviewed: 05/25/2013 [facility] Patient Information 2015 [place], [place]. This information is not intended to replace advice given to you by your health care provider. Make sure you discuss any questions you have with your health care provider.     I personally performed the services described in this documentation, which was scribed in my presence. The  recorded information has been reviewed and considered, and addended by me as needed.

## 2014-09-07 NOTE — Patient Instructions (Signed)
Start prednisone, continue albuterol inhaler up to every 4 hours as needed. Continue antibiotic as prescribed.  If not improving in next 2 days - recheck with other provider.  Return to the clinic or go to the nearest emergency room if any of your symptoms worsen or new symptoms occur. Asthma, Acute Bronchospasm Acute bronchospasm caused by asthma is also referred to as an asthma attack. Bronchospasm means your air passages become narrowed. The narrowing is caused by inflammation and tightening of the muscles in the air tubes (bronchi) in your lungs. This can make it hard to breathe or cause you to wheeze and cough. CAUSES Possible triggers are:  Animal dander from the skin, hair, or feathers of animals.  Dust mites contained in house dust.  Cockroaches.  Pollen from trees or grass.  Mold.  Cigarette or tobacco smoke.  Air pollutants such as dust, household cleaners, hair sprays, aerosol sprays, paint fumes, strong chemicals, or strong odors.  Cold air or weather changes. Cold air may trigger inflammation. Winds increase molds and pollens in the air.  Strong emotions such as crying or laughing hard.  Stress.  Certain medicines such as aspirin or beta-blockers.  Sulfites in foods and drinks, such as dried fruits and wine.  Infections or inflammatory conditions, such as a flu, cold, or inflammation of the nasal membranes (rhinitis).  Gastroesophageal reflux disease (GERD). GERD is a condition where stomach acid backs up into your esophagus.  Exercise or strenuous activity. SIGNS AND SYMPTOMS   Wheezing.  Excessive coughing, particularly at night.  Chest tightness.  Shortness of breath. DIAGNOSIS  Your health care provider will ask you about your medical history and perform a physical exam. A chest X-ray or blood testing may be performed to look for other causes of your symptoms or other conditions that may have triggered your asthma attack. TREATMENT  Treatment is  aimed at reducing inflammation and opening up the airways in your lungs. Most asthma attacks are treated with inhaled medicines. These include quick relief or rescue medicines (such as bronchodilators) and controller medicines (such as inhaled corticosteroids). These medicines are sometimes given through an inhaler or a nebulizer. Systemic steroid medicine taken by mouth or given through an IV tube also can be used to reduce the inflammation when an attack is moderate or severe. Antibiotic medicines are only used if a bacterial infection is present.  HOME CARE INSTRUCTIONS   Rest.  Drink plenty of liquids. This helps the mucus to remain thin and be easily coughed up. Only use caffeine in moderation and do not use alcohol until you have recovered from your illness.  Do not smoke. Avoid being exposed to secondhand smoke.  You play a critical role in keeping yourself in good health. Avoid exposure to things that cause you to wheeze or to have breathing problems.  Keep your medicines up-to-date and available. Carefully follow your health care provider's treatment plan.  Take your medicine exactly as prescribed.  When pollen or pollution is bad, keep windows closed and use an air conditioner or go to places with air conditioning.  Asthma requires careful medical care. See your health care provider for a follow-up as advised. If you are more than [redacted] weeks pregnant and you were prescribed any new medicines, let your obstetrician know about the visit and how you are doing. Follow up with your health care provider as directed.  After you have recovered from your asthma attack, make an appointment with your outpatient doctor to talk about ways  to reduce the likelihood of future attacks. If you do not have a doctor who manages your asthma, make an appointment with a primary care doctor to discuss your asthma. SEEK IMMEDIATE MEDICAL CARE IF:   You are getting worse.  You have trouble breathing. If  severe, call your local emergency services (911 in the [place]  You develop chest pain or discomfort.  You are vomiting.  You are not able to keep fluids down.  You are coughing up yellow, green, brown, or bloody sputum.  You have a fever and your symptoms suddenly get worse.  You have trouble swallowing. MAKE SURE YOU:   Understand these instructions.  Will watch your condition.  Will get help right away if you are not doing well or get worse. Document Released: 03/04/2007 Document Revised: 11/22/2013 Document Reviewed: 05/25/2013 [facility] Patient Information 2015 [place], [place]. This information is not intended to replace advice given to you by your health care provider. Make sure you discuss any questions you have with your health care provider.

## 2014-09-27 IMAGING — CR DG TOE 4TH 2+V*L*
1 series · 1 of 1 positions shown · non-contrast
Comparison: None.

CLINICAL DATA: Injury to the fourth toe.

EXAM:
LEFT FOURTH TOE

[AP]
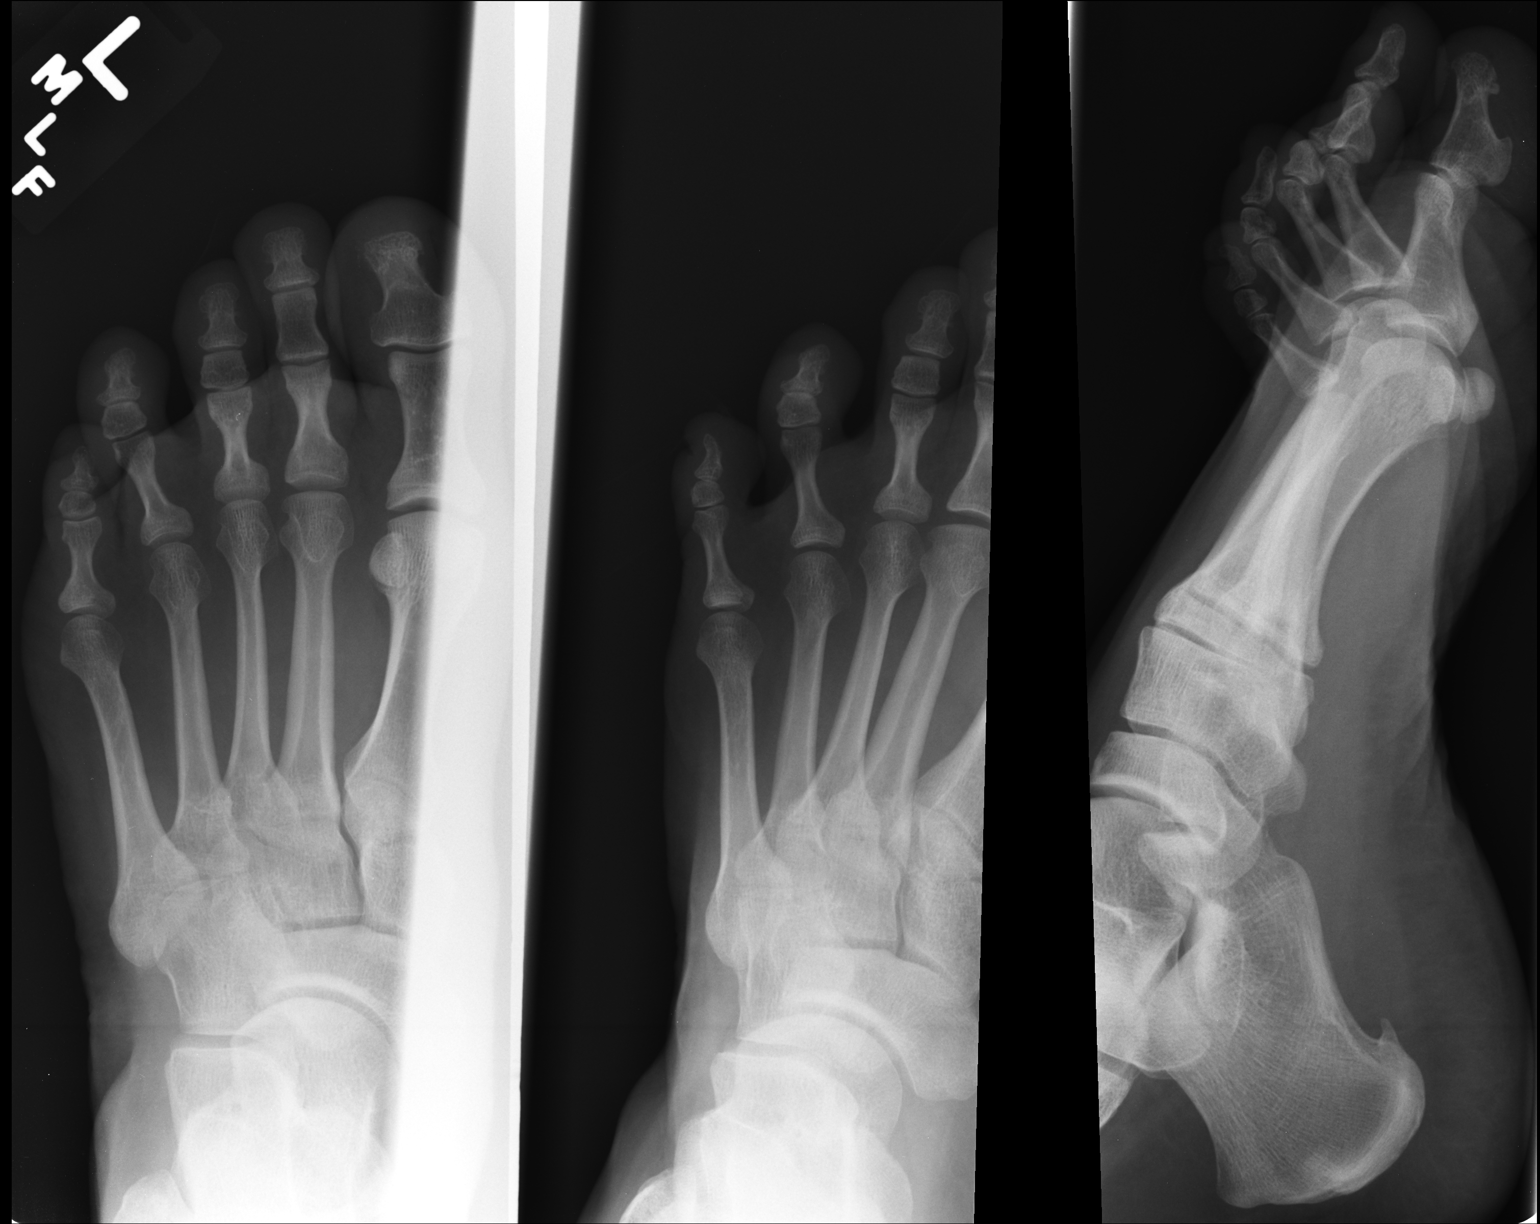

[1 of 1 positions shown; findings below may reference images not displayed]

FINDINGS: Normal anatomic alignment. No evidence for acute fracture or
dislocation. Suggestion of small lucent lesion within the distal
phalanx of the fourth digit, potentially representing a small
enchondroma or bone cyst.
IMPRESSION: No evidence for acute osseous abnormality.

## 2014-10-04 ENCOUNTER — Other Ambulatory Visit: Payer: Self-pay | Admitting: Family Medicine

## 2014-10-31 ENCOUNTER — Other Ambulatory Visit: Payer: Self-pay | Admitting: Family Medicine

## 2014-11-02 ENCOUNTER — Other Ambulatory Visit: Payer: Self-pay | Admitting: Family Medicine

## 2014-11-03 ENCOUNTER — Ambulatory Visit (INDEPENDENT_AMBULATORY_CARE_PROVIDER_SITE_OTHER): Payer: BC Managed Care – PPO | Admitting: Family Medicine

## 2014-11-03 ENCOUNTER — Encounter: Payer: Self-pay | Admitting: Family Medicine

## 2014-11-03 VITALS — BP 137/88 | HR 83 | Temp 98.3°F | Resp 16 | Ht 66.5 in | Wt 191.0 lb

## 2014-11-03 DIAGNOSIS — F32A Depression, unspecified: Secondary | ICD-10-CM

## 2014-11-03 DIAGNOSIS — K429 Umbilical hernia without obstruction or gangrene: Secondary | ICD-10-CM

## 2014-11-03 DIAGNOSIS — F411 Generalized anxiety disorder: Secondary | ICD-10-CM

## 2014-11-03 DIAGNOSIS — I10 Essential (primary) hypertension: Secondary | ICD-10-CM

## 2014-11-03 DIAGNOSIS — E785 Hyperlipidemia, unspecified: Secondary | ICD-10-CM

## 2014-11-03 DIAGNOSIS — F329 Major depressive disorder, single episode, unspecified: Secondary | ICD-10-CM

## 2014-11-03 MED ORDER — CITALOPRAM HYDROBROMIDE 20 MG PO TABS: ORAL_TABLET | ORAL | Status: DC

## 2014-11-03 MED ORDER — LISINOPRIL-HYDROCHLOROTHIAZIDE 20-12.5 MG PO TABS: 1.0000 | ORAL_TABLET | Freq: Every day | ORAL | Status: DC

## 2014-11-03 MED ORDER — ALPRAZOLAM 0.5 MG PO TABS: ORAL_TABLET | ORAL | Status: DC

## 2014-11-03 NOTE — Patient Instructions (Signed)

## 2014-11-05 IMAGING — CR DG CHEST 2V
2 series · 2 of 2 positions shown · non-contrast
Comparison: August 18, 2011

CLINICAL DATA: Productive cough for several days; wheezing and
shortness of breath developing 1 day prior

EXAM:
CHEST  2 VIEW

[PA]
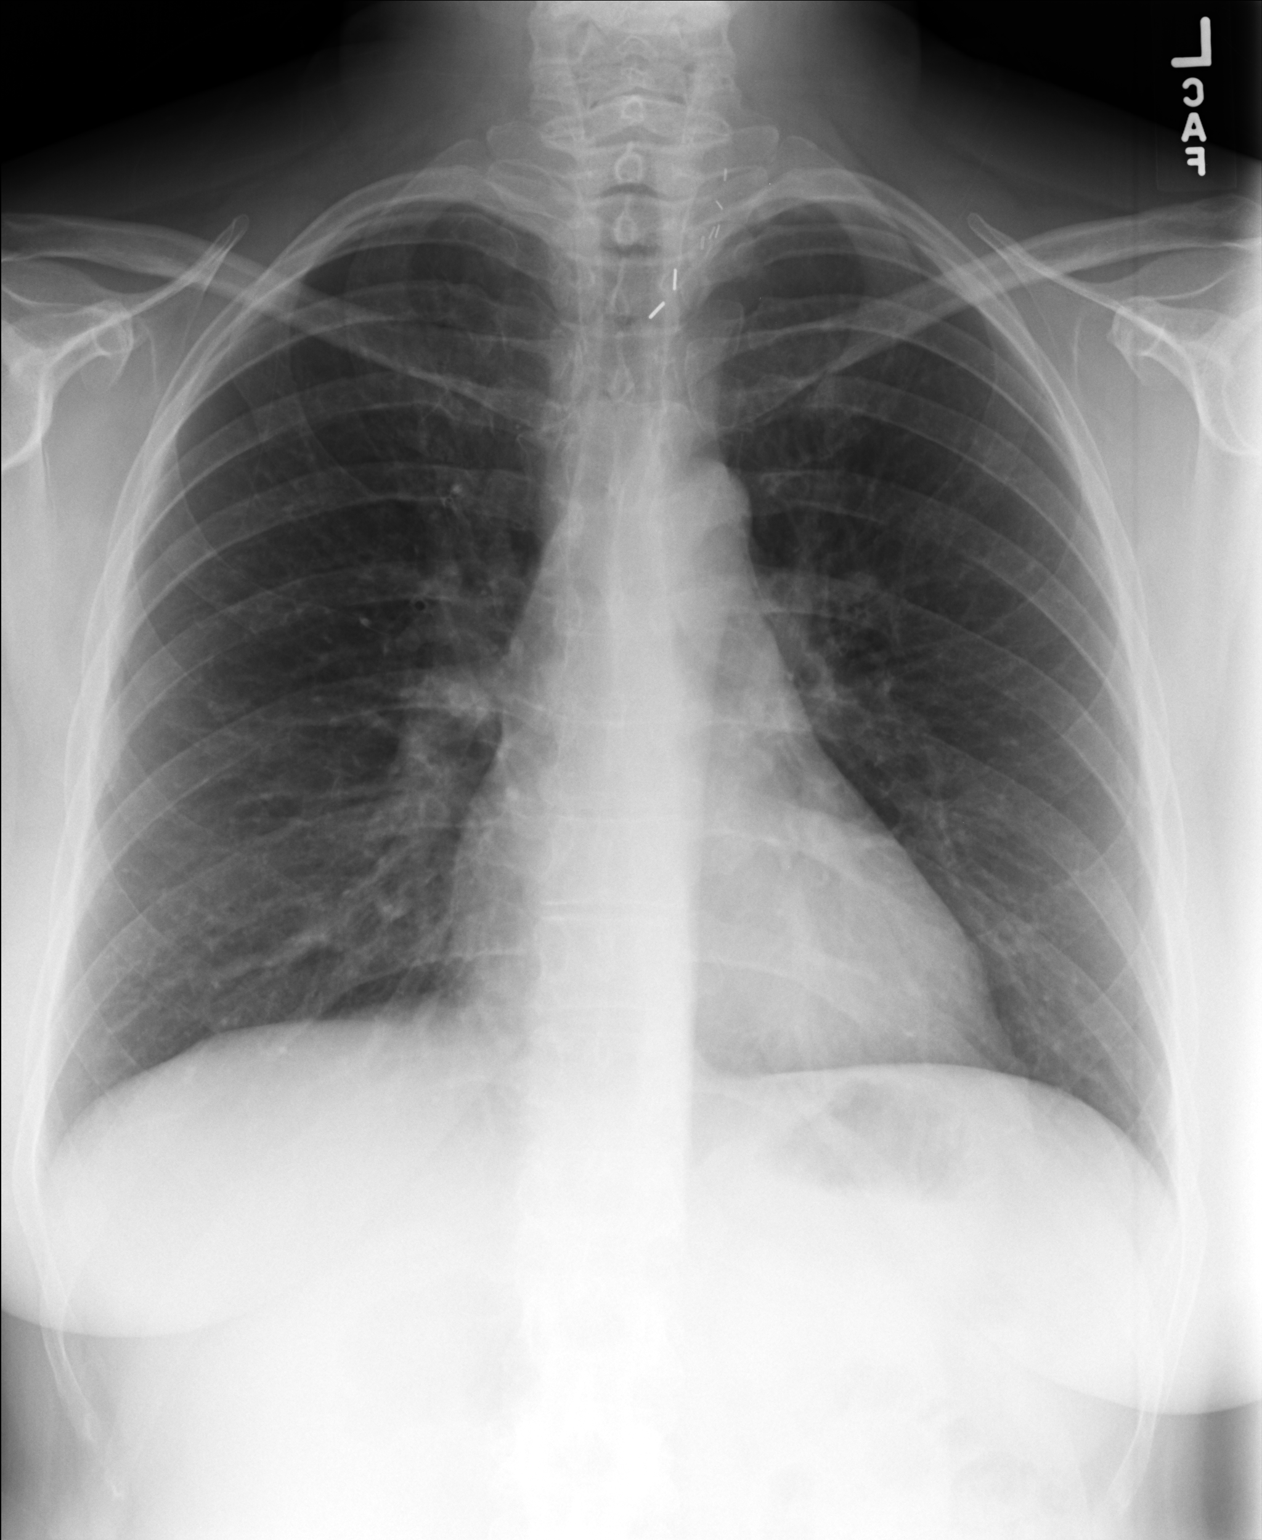

[lateral]
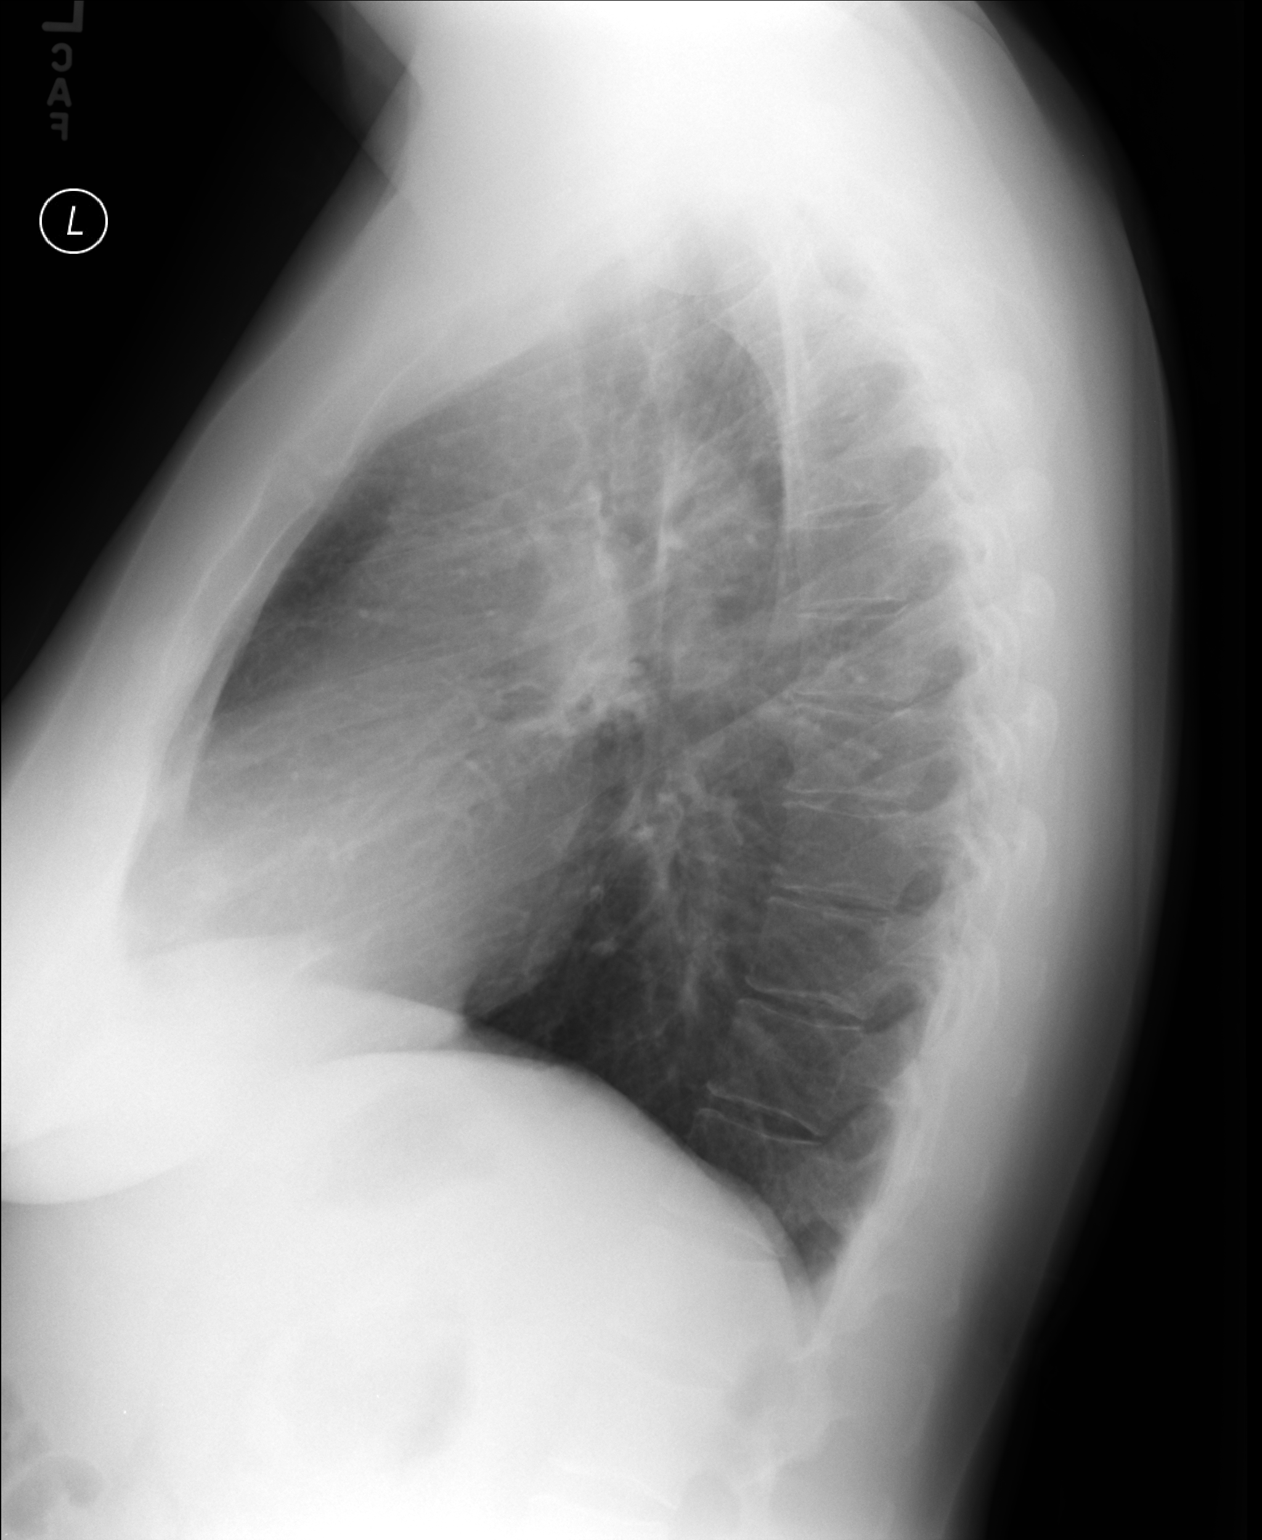

[2 of 2 positions shown; findings below may reference images not displayed]

FINDINGS: Lungs are clear. Heart size and pulmonary vascularity are normal. No
adenopathy. No bone lesions. There are surgical clips in the left
upper hemithorax medially.
IMPRESSION: No edema or consolidation.

## 2014-11-07 ENCOUNTER — Telehealth: Payer: Self-pay

## 2014-11-07 DIAGNOSIS — R1033 Periumbilical pain: Secondary | ICD-10-CM

## 2014-11-07 NOTE — Telephone Encounter (Signed)
Patient has questions regarding the diagnosis she received last Friday. Patient not sure if she was suppose to schedule a scan or not? She stated she is still having pain and tenderness (above her naval). Patients call back number is [phone]

## 2014-11-07 NOTE — Telephone Encounter (Signed)
Dr. Brigitte Pulse please advise. OV notes not available.

## 2014-11-08 NOTE — Telephone Encounter (Signed)
Lm for rtn call 

## 2014-11-08 NOTE — Telephone Encounter (Signed)
Suspect pt has an umbilical hernia. If pt is still having pain, will order an abd CT w/ contrast for further eval if pt would want to consider surgical repair. However, fine to continue watchful waiting for now for a few more days if pain seems to be improving some as no concern for incarceration/strangulation on exam and can proceed if sxs persist after another week or sooner if worsening.

## 2014-11-09 NOTE — Telephone Encounter (Signed)
Pt states that she is feeling better. She would like to proceed with the CT scan but is feeling much better than she was a few days ago.

## 2014-11-09 NOTE — Telephone Encounter (Signed)
Great, CT ordered

## 2014-11-16 ENCOUNTER — Telehealth: Payer: Self-pay

## 2014-11-16 NOTE — Telephone Encounter (Signed)
Perfect. Thank you Pamala Hurry!!!

## 2014-11-16 NOTE — Telephone Encounter (Signed)
[facility] called. Pt sch for CT Abd tomorrow and want permission to add a CT Pelvis also. Advised that if problem is under the umbilicus it will not be seen with just the CT Abd. I OK'd addition and she stated she can change herself w/our VO. Dr Brigitte Pulse, Jul[facility]ier

## 2014-11-17 ENCOUNTER — Ambulatory Visit
Admission: RE | Admit: 2014-11-17 | Discharge: 2014-11-17 | Disposition: A | Discharge status: Home / Self Care | Payer: BC Managed Care – PPO | Source: Ambulatory Visit | Attending: Family Medicine | Admitting: Family Medicine

## 2014-11-17 DIAGNOSIS — R1033 Periumbilical pain: Secondary | ICD-10-CM

## 2014-11-17 MED ORDER — IOHEXOL 300 MG/ML  SOLN
100.0000 mL | Freq: Once | INTRAMUSCULAR | Status: AC | PRN
  Administered 2014-11-17: 100 mL via INTRAVENOUS

## 2014-11-26 NOTE — Progress Notes (Signed)
Subjective:  This chart was scribed for Shawnee Knapp, MD by Lowella Petties, ED Scribe. The patient was seen in room 27. Patient's care was started at 12:06 PM.   Patient ID: Stacy Hull, female    DOB: [birth year], 43 y.o.   MRN: [id number] Chief Complaint  Patient presents with  . Medication Refill    HPI  HPI Comments: Stacy Hull is a 43 y.o. female who presents to the [facility] for a checkup and medication refill.   She states that this has been a stressful semester with her class, and she reports ocasionaly taking alprazolam to help her fall asleep. She states that she is not having problems with Celexa, but that she wants to try weaning herself off of it in the spring.  She additionally reports tender pain in her abdomen and a distended area that appeared when she was bending over wrapping presents yesterday, and she expresses concern about a hernia.   She denies using her albuterol frequently, although she reports using it in October when she had pneumonia.   She states that she had her flu shot.   She recently had an A1-C screen at her gynecologist with good results. She reports going to the [facility] for exercise. She states that she had a mammogram when she turned 42 and is UTD on her pap smear. She states that she had a tubal ligation.  Past Medical History  Diagnosis Date  . Hyperlipidemia   . HTN (hypertension)   . Anxiety   . Allergic rhinitis   . Depression    Current Outpatient Prescriptions on File Prior to Visit  Medication Sig Dispense Refill  . albuterol (PROVENTIL HFA;VENTOLIN HFA) 108 (90 BASE) MCG/ACT inhaler Inhale 1-2 puffs into the lungs every 4 (four) hours as needed for wheezing or shortness of breath. 1 Inhaler 0  . Multiple Vitamin (MULTIVITAMIN) tablet Take 1 tablet by mouth daily.     No current facility-administered medications on file prior to visit.   Allergies  Allergen Reactions  . Erythromycin Other  (See Comments)    G I upset.    Review of Systems  Constitutional: Negative for appetite change and fatigue.  Gastrointestinal: Positive for abdominal pain.  Psychiatric/Behavioral: Negative for dysphoric mood. The patient is not nervous/anxious.     BP 137/88 mmHg  Pulse 83  Temp(Src) 98.3 F (36.8 C) (Oral)  Resp 16  Ht 5' 6.5" (1.689 m)  Wt 191 lb (86.637 kg)  BMI 30.37 kg/m2  SpO2 98%  LMP 10/13/2014  Objective:  Physical Exam  Constitutional: She is oriented to person, place, and time. She appears well-developed and well-nourished. No distress.  HENT:  Head: Normocephalic and atraumatic.  Eyes: Conjunctivae and EOM are normal.  Neck: Neck supple. No tracheal deviation present.  Cardiovascular: Normal rate, regular rhythm and normal heart sounds.   No murmur heard. Pulmonary/Chest: Effort normal and breath sounds normal. No respiratory distress. She has no wheezes. She has no rales.  Abdominal:  Small umbilical hernia w/ a question of a small diastasis recti immediatly superior.   Musculoskeletal: Normal range of motion.  Neurological: She is alert and oriented to person, place, and time.  Skin: Skin is warm and dry.  Psychiatric: She has a normal mood and affect. Her behavior is normal.  Nursing note and vitals reviewed.   Assessment & Plan:   Hyperlipidemia  Anxiety state  Depression - doing well, consider cutting citalopram in half  if doing well in sev mos still.  Essential hypertension - well controlled, cont current regimen.  Umbilical hernia without obstruction and without gangrene - pt reassured since no signs of incarceration/strangulation - pt reassured that watchful waiting is fine as sxs improving but if continues to bother her will get imaging - CT likely best as suspect defect is quite small - if pt becomes interested in surgical repair due to recurrent pain she will call so we can proceed.  Meds ordered this encounter  Medications  . ALPRAZolam  (XANAX) 0.5 MG tablet    Sig: TAKE 1 TABLET BY MOUTH AT BEDTIME AS NEEDED FOR SLEEP OR ANXIETY    Dispense:  30 tablet    Refill:  3  . citalopram (CELEXA) 20 MG tablet    Sig: TAKE 1 TABLET BY MOUTH DAILY    Dispense:  90 tablet    Refill:  3  . lisinopril-hydrochlorothiazide (PRINZIDE,ZESTORETIC) 20-12.5 MG per tablet    Sig: Take 1 tablet by mouth daily.    Dispense:  90 tablet    Refill:  3    I personally performed the services described in this documentation, which was scribed in my presence. The recorded information has been reviewed and considered, and addended by me as needed.  [name], MD MPH

## 2014-11-27 ENCOUNTER — Other Ambulatory Visit: Payer: Self-pay | Admitting: Family Medicine

## 2014-11-27 DIAGNOSIS — K429 Umbilical hernia without obstruction or gangrene: Secondary | ICD-10-CM

## 2014-12-07 ENCOUNTER — Ambulatory Visit (INDEPENDENT_AMBULATORY_CARE_PROVIDER_SITE_OTHER): Payer: BC Managed Care – PPO | Admitting: Emergency Medicine

## 2014-12-07 VITALS — BP 128/84 | HR 86 | Temp 97.8°F | Resp 18 | Ht 66.5 in | Wt 193.2 lb

## 2014-12-07 DIAGNOSIS — K460 Unspecified abdominal hernia with obstruction, without gangrene: Secondary | ICD-10-CM

## 2014-12-07 DIAGNOSIS — K436 Other and unspecified ventral hernia with obstruction, without gangrene: Secondary | ICD-10-CM

## 2014-12-07 MED ORDER — HYDROCODONE-ACETAMINOPHEN 10-325 MG PO TABS: 1.0000 | ORAL_TABLET | Freq: Three times a day (TID) | ORAL | Status: DC | PRN

## 2014-12-07 NOTE — Patient Instructions (Signed)
[facility] MEDICAL SUPPLY: Address: [address], [place],  [address]  [phone]

## 2014-12-07 NOTE — Progress Notes (Signed)
[facility] [address] [place] [address] [phone]  Date:  12/07/2014   Name:  Stacy Hull   DOB:  1971-03-26   MRN:  [id number]  PCP:  [name], MD    Chief Complaint: Umbilical Hernia   History of Present Illness:  Stacy Hull is a 44 y.o. very pleasant female patient who presents with the following:  Patient has pain in abdomen.  She has a large rectus abdominus defect superior to umbilicus and is known to have a small umbilical hernia She has pain with slight jostling. No fever or chills.  Some nausea no vomiting No stool change.  Still stooling. No improvement with over the counter medications or other home remedies. Denies other complaint or health concern today.   Patient Active Problem List   Diagnosis Date Noted  . Anxiety state 09/27/2007  . Hyperlipidemia 03/18/2007  . Depression 03/18/2007  . Essential hypertension 03/18/2007    Past Medical History  Diagnosis Date  . Hyperlipidemia   . HTN (hypertension)   . Anxiety   . Allergic rhinitis   . Depression     Past Surgical History  Procedure Laterality Date  . Cesarean section  2005  . Parathyroidectomy  1999    benign  . Cervical cone biopsy    . Tubal ligation    . Hysteroscopy with novasure  11/26/2012    Procedure: HYSTEROSCOPY WITH NOVASURE;  Surgeon: Marvene Staff, MD;  Location: [facility];  Service: Gynecology;  Laterality: N/A;    History  Substance Use Topics  . Smoking status: Never Smoker   . Smokeless tobacco: Never Used  . Alcohol Use: 2.0 oz/week    4 drink(s) per week     Comment: occasionally    Family History  Problem Relation Age of Onset  . Depression Mother   . Hypertension Father   . Aneurysm Father 27    cerebral  . Diabetes Maternal Grandmother   . Diabetes Maternal Grandfather   . Stroke Paternal Grandfather 71    Allergies  Allergen Reactions  . Erythromycin Other (See Comments)    G I upset.    Medication  list has been reviewed and updated.  Current Outpatient Prescriptions on File Prior to Visit  Medication Sig Dispense Refill  . albuterol (PROVENTIL HFA;VENTOLIN HFA) 108 (90 BASE) MCG/ACT inhaler Inhale 1-2 puffs into the lungs every 4 (four) hours as needed for wheezing or shortness of breath. 1 Inhaler 0  . ALPRAZolam (XANAX) 0.5 MG tablet TAKE 1 TABLET BY MOUTH AT BEDTIME AS NEEDED FOR SLEEP OR ANXIETY 30 tablet 3  . citalopram (CELEXA) 20 MG tablet TAKE 1 TABLET BY MOUTH DAILY 90 tablet 3  . lisinopril-hydrochlorothiazide (PRINZIDE,ZESTORETIC) 20-12.5 MG per tablet Take 1 tablet by mouth daily. 90 tablet 3  . Multiple Vitamin (MULTIVITAMIN) tablet Take 1 tablet by mouth daily.     No current facility-administered medications on file prior to visit.    Review of Systems:  As per HPI, otherwise negative.    Physical Examination: Filed Vitals:   12/07/14 1032  BP: 128/84  Pulse: 86  Temp: 97.8 F (36.6 C)  Resp: 18   Filed Vitals:   12/07/14 1032  Height: 5' 6.5" (1.689 m)  Weight: 193 lb 3.2 oz (87.635 kg)   Body mass index is 30.72 kg/(m^2). Ideal Body Weight: Weight in (lb) to have BMI = 25: 156.9  GEN: WDWN, NAD, Non-toxic, A & O x 3 HEENT: Atraumatic,  Normocephalic. Neck supple. No masses, No LAD. Ears and Nose: No external deformity. CV: RRR, No M/G/R. No JVD. No thrill. No extra heart sounds. PULM: CTA B, no wheezes, crackles, rhonchi. No retractions. No resp. distress. No accessory muscle use. ABD: S, tender umbilicus, ND, +BS. No rebound. No HSM. EXTR: No c/c/e NEURO Normal gait.  PSYCH: Normally interactive. Conversant. Not depressed or anxious appearing.  Calm demeanor.    Assessment and Plan: Incarcerated umbilical hernia Diastasis rectus  Signed,  Ellison Carwin, MD

## 2015-05-21 ENCOUNTER — Other Ambulatory Visit: Payer: Self-pay | Admitting: Family Medicine

## 2015-07-17 LAB — HM PAP SMEAR: HM PAP: NEGATIVE

## 2015-07-22 ENCOUNTER — Other Ambulatory Visit: Payer: Self-pay | Admitting: Family Medicine

## 2015-07-23 NOTE — Telephone Encounter (Signed)
I phoned this in. I would like pt to be seen in [place] for additional refills

## 2015-09-04 LAB — HM MAMMOGRAPHY

## 2015-09-10 ENCOUNTER — Ambulatory Visit (INDEPENDENT_AMBULATORY_CARE_PROVIDER_SITE_OTHER): Payer: BC Managed Care – PPO | Admitting: Emergency Medicine

## 2015-09-10 ENCOUNTER — Encounter (HOSPITAL_COMMUNITY): Payer: Self-pay | Admitting: Emergency Medicine

## 2015-09-10 ENCOUNTER — Emergency Department (HOSPITAL_COMMUNITY)
Admission: EM | Admit: 2015-09-10 | Discharge: 2015-09-10 | Disposition: A | Discharge status: Home / Self Care | Payer: BC Managed Care – PPO | Attending: Emergency Medicine | Admitting: Emergency Medicine

## 2015-09-10 ENCOUNTER — Emergency Department (HOSPITAL_COMMUNITY): Payer: BC Managed Care – PPO

## 2015-09-10 ENCOUNTER — Ambulatory Visit (INDEPENDENT_AMBULATORY_CARE_PROVIDER_SITE_OTHER): Payer: BC Managed Care – PPO

## 2015-09-10 VITALS — BP 150/90 | HR 74 | Temp 98.6°F | Resp 16 | Ht 66.5 in | Wt 199.0 lb

## 2015-09-10 DIAGNOSIS — R1030 Lower abdominal pain, unspecified: Secondary | ICD-10-CM

## 2015-09-10 DIAGNOSIS — F419 Anxiety disorder, unspecified: Secondary | ICD-10-CM | POA: Insufficient documentation

## 2015-09-10 DIAGNOSIS — Z79899 Other long term (current) drug therapy: Secondary | ICD-10-CM | POA: Diagnosis not present

## 2015-09-10 DIAGNOSIS — Z8709 Personal history of other diseases of the respiratory system: Secondary | ICD-10-CM | POA: Insufficient documentation

## 2015-09-10 DIAGNOSIS — R112 Nausea with vomiting, unspecified: Secondary | ICD-10-CM

## 2015-09-10 DIAGNOSIS — R1031 Right lower quadrant pain: Secondary | ICD-10-CM

## 2015-09-10 DIAGNOSIS — N83201 Unspecified ovarian cyst, right side: Secondary | ICD-10-CM | POA: Diagnosis not present

## 2015-09-10 DIAGNOSIS — Z8639 Personal history of other endocrine, nutritional and metabolic disease: Secondary | ICD-10-CM | POA: Insufficient documentation

## 2015-09-10 DIAGNOSIS — I10 Essential (primary) hypertension: Secondary | ICD-10-CM | POA: Insufficient documentation

## 2015-09-10 DIAGNOSIS — F329 Major depressive disorder, single episode, unspecified: Secondary | ICD-10-CM | POA: Insufficient documentation

## 2015-09-10 LAB — POCT CBC
GRANULOCYTE PERCENT: 62.5 % (ref 37–80)
HCT, POC: 41.4 % (ref 37.7–47.9)
Hemoglobin: 14 g/dL (ref 12.2–16.2)
Lymph, poc: 3.3 (ref 0.6–3.4)
MCH, POC: 31.1 pg (ref 27–31.2)
MCHC: 33.9 g/dL (ref 31.8–35.4)
MCV: 91.8 fL (ref 80–97)
MID (CBC): 0.7 (ref 0–0.9)
MPV: 6.6 fL (ref 0–99.8)
PLATELET COUNT, POC: 303 10*3/uL (ref 142–424)
POC Granulocyte: 6.6 (ref 2–6.9)
POC LYMPH %: 30.9 % (ref 10–50)
POC MID %: 6.6 % (ref 0–12)
RBC: 4.5 M/uL (ref 4.04–5.48)
RDW, POC: 12.5 %
WBC: 10.6 10*3/uL — AB (ref 4.6–10.2)

## 2015-09-10 LAB — POCT URINALYSIS DIP (MANUAL ENTRY)
Glucose, UA: NEGATIVE
LEUKOCYTES UA: NEGATIVE
Nitrite, UA: NEGATIVE
PROTEIN UA: NEGATIVE
Spec Grav, UA: 1.025
UROBILINOGEN UA: 0.2
pH, UA: 6

## 2015-09-10 LAB — COMPREHENSIVE METABOLIC PANEL
ALT: 13 U/L — ABNORMAL LOW (ref 14–54)
AST: 19 U/L (ref 15–41)
Albumin: 3.7 g/dL (ref 3.5–5.0)
Alkaline Phosphatase: 50 U/L (ref 38–126)
Anion gap: 11 (ref 5–15)
BUN: 6 mg/dL (ref 6–20)
CHLORIDE: 101 mmol/L (ref 101–111)
CO2: 24 mmol/L (ref 22–32)
Calcium: 8.8 mg/dL — ABNORMAL LOW (ref 8.9–10.3)
Creatinine, Ser: 0.55 mg/dL (ref 0.44–1.00)
Glucose, Bld: 101 mg/dL — ABNORMAL HIGH (ref 65–99)
POTASSIUM: 3.5 mmol/L (ref 3.5–5.1)
SODIUM: 136 mmol/L (ref 135–145)
Total Bilirubin: 0.6 mg/dL (ref 0.3–1.2)
Total Protein: 6.7 g/dL (ref 6.5–8.1)

## 2015-09-10 LAB — CBC
HEMATOCRIT: 39.7 % (ref 36.0–46.0)
Hemoglobin: 13.6 g/dL (ref 12.0–15.0)
MCH: 32.3 pg (ref 26.0–34.0)
MCHC: 34.3 g/dL (ref 30.0–36.0)
MCV: 94.3 fL (ref 78.0–100.0)
Platelets: 260 10*3/uL (ref 150–400)
RBC: 4.21 MIL/uL (ref 3.87–5.11)
RDW: 12.5 % (ref 11.5–15.5)
WBC: 9.6 10*3/uL (ref 4.0–10.5)

## 2015-09-10 LAB — POC MICROSCOPIC URINALYSIS (UMFC)

## 2015-09-10 LAB — LIPASE, BLOOD: LIPASE: 22 U/L (ref 22–51)

## 2015-09-10 LAB — I-STAT BETA HCG BLOOD, ED (MC, WL, AP ONLY): I-stat hCG, quantitative: <5 m[IU]/mL (ref <5)

## 2015-09-10 LAB — POCT URINE PREGNANCY: Preg Test, Ur: NEGATIVE

## 2015-09-10 MED ORDER — MORPHINE SULFATE (PF) 4 MG/ML IV SOLN
4.0000 mg | Freq: Once | INTRAVENOUS | Status: AC
  Administered 2015-09-10: 4 mg via INTRAVENOUS
  Filled 2015-09-10: qty 1

## 2015-09-10 MED ORDER — ONDANSETRON HCL 4 MG/2ML IJ SOLN
4.0000 mg | Freq: Once | INTRAMUSCULAR | Status: AC
  Administered 2015-09-10: 4 mg via INTRAVENOUS
  Filled 2015-09-10: qty 2

## 2015-09-10 MED ORDER — OXYCODONE-ACETAMINOPHEN 5-325 MG PO TABS: 2.0000 | ORAL_TABLET | ORAL | Status: DC | PRN

## 2015-09-10 MED ORDER — IOHEXOL 300 MG/ML  SOLN
100.0000 mL | Freq: Once | INTRAMUSCULAR | Status: AC | PRN
  Administered 2015-09-10: 100 mL via INTRAVENOUS

## 2015-09-10 NOTE — Discharge Instructions (Signed)
Abdominal Pain, Adult Follow-up with women's outpatient clinic or your primary care provider. Take Tylenol or ibuprofen for pain and Percocet for breakthrough pain. Many things can cause abdominal pain. Usually, abdominal pain is not caused by a disease and will improve without treatment. It can often be observed and treated at home. Your health care provider will do a physical exam and possibly order blood tests and X-rays to help determine the seriousness of your pain. However, in many cases, more time must pass before a clear cause of the pain can be found. Before that point, your health care provider may not know if you need more testing or further treatment. HOME CARE INSTRUCTIONS Monitor your abdominal pain for any changes. The following actions may help to alleviate any discomfort you are experiencing:  Only take over-the-counter or prescription medicines as directed by your health care provider.  Do not take laxatives unless directed to do so by your health care provider.  Try a clear liquid diet (broth, tea, or water) as directed by your health care provider. Slowly move to a bland diet as tolerated. SEEK MEDICAL CARE IF:  You have unexplained abdominal pain.  You have abdominal pain associated with nausea or diarrhea.  You have pain when you urinate or have a bowel movement.  You experience abdominal pain that wakes you in the night.  You have abdominal pain that is worsened or improved by eating food.  You have abdominal pain that is worsened with eating fatty foods.  You have a fever. SEEK IMMEDIATE MEDICAL CARE IF:  Your pain does not go away within 2 hours.  You keep throwing up (vomiting).  Your pain is felt only in portions of the abdomen, such as the right side or the left lower portion of the abdomen.  You pass bloody or black tarry stools. MAKE SURE YOU:  Understand these instructions.  Will watch your condition.  Will get help right away if you are not  doing well or get worse.   This information is not intended to replace advice given to you by your health care provider. Make sure you discuss any questions you have with your health care provider.   Document Released: 08/27/2005 Document Revised: 08/08/2015 Document Reviewed: 07/27/2013 Elsevier Interactive Patient Education [employer].

## 2015-09-10 NOTE — ED Notes (Signed)
Pt from home with c/o RLQ pain since Friday.  Pt reports the pain has since begun radiating down her right leg and groin area.  Pt denies urinary symptoms, fevers, reports being on her menstrual cycle.  Reports pain decreased with stand and emesis x 1 associated with pain.  Pt in NAD, A&O.

## 2015-09-10 NOTE — ED Notes (Signed)
Pt left with all belongings and was wheeled out of treatment area.  

## 2015-09-10 NOTE — Progress Notes (Addendum)
Subjective:  This chart was scribed for Arlyss Queen MD, by Tamsen Roers, at [facility].  This patient was seen in room 5 and the patient's care was started at 1:11 PM.   Chief Complaint  Patient presents with  . Abdominal Pain    pain going down leg./ x 3 days     Patient ID: Stacy Hull, female    DOB: [birth year], 44 y.o.   MRN: [id number]  HPI  HPI Comments: Stacy Hull is a 44 y.o. female who [facility] complaining of dull shooting (to her legs) right sided abdominal pain onset three days ago.  She also describes it as a stabbing sensation at times.  Patient has associated symptoms of vomiting yesterday and states that the pain is not as intense when she stands up.  She feels more fatigued than usual and had to spend the weekend in bed which is unusual for her.  She has tried using a heating pad but denies relief.  Patient had surgery on her hernia in January. Patient is currently on her period and states that the pain started before her period began. She has had a tubal ligation in the past as well as a c-section. Denies a fever, burning with urination, abnormal bowel movements.   Patient Active Problem List   Diagnosis Date Noted  . Anxiety state 09/27/2007  . Hyperlipidemia 03/18/2007  . Depression 03/18/2007  . Essential hypertension 03/18/2007   Past Medical History  Diagnosis Date  . Hyperlipidemia   . HTN (hypertension)   . Anxiety   . Allergic rhinitis   . Depression    Past Surgical History  Procedure Laterality Date  . Cesarean section  2005  . Parathyroidectomy  1999    benign  . Cervical cone biopsy    . Tubal ligation    . Hysteroscopy with novasure  11/26/2012    Procedure: HYSTEROSCOPY WITH NOVASURE;  Surgeon: Marvene Staff, MD;  Location: [facility];  Service: Gynecology;  Laterality: N/A;   Allergies  Allergen Reactions  . Erythromycin Other (See Comments)    G I upset.     Prior to Admission medications   Medication Sig Start Date End Date Taking? Authorizing Provider  albuterol (PROVENTIL HFA;VENTOLIN HFA) 108 (90 BASE) MCG/ACT inhaler Inhale 1-2 puffs into the lungs every 4 (four) hours as needed for wheezing or shortness of breath. 09/05/14  Yes Wendie Agreste, MD  ALPRAZolam Duanne Moron) 0.5 MG tablet TAKE 1 TABLET BY MOUTH AT BEDTIME AS NEEDED FOR ANXIETY AND SLEEP 07/23/15  Yes Shawnee Knapp, MD  citalopram (CELEXA) 20 MG tablet TAKE 1 TABLET BY MOUTH DAILY 11/03/14  Yes Shawnee Knapp, MD  lisinopril-hydrochlorothiazide (PRINZIDE,ZESTORETIC) 20-12.5 MG per tablet Take 1 tablet by mouth daily. 11/03/14  Yes Shawnee Knapp, MD  Multiple Vitamin (MULTIVITAMIN) tablet Take 1 tablet by mouth daily.   Yes Historical Provider, MD  HYDROcodone-acetaminophen (NORCO) 10-325 MG per tablet Take 1 tablet by mouth every 8 (eight) hours as needed. Patient not taking: Reported on 09/10/2015 12/07/14   Roselee Culver, MD   Social History   Social History  . Marital Status: Married    Spouse Name: Azyiah Bo  . Number of Children: 2  . Years of Education: Master's +   Occupational History  . [employer]   Social History Main Topics  .  Smoking status: Never Smoker   . Smokeless tobacco: Never Used  . Alcohol Use: 2.0 oz/week    4 drink(s) per week     Comment: occasionally  . Drug Use: No  . Sexual Activity:    Partners: Male   Other Topics Concern  . Not on file   Social History Narrative   Lives with her husband and their 2 children. Walks 3x's weekly for exercise.        Review of Systems  Constitutional: Positive for fatigue. Negative for fever and chills.  Respiratory: Negative for cough and choking.   Gastrointestinal: Positive for nausea, vomiting and abdominal pain.  Genitourinary: Negative for dysuria, urgency, hematuria, decreased urine volume, difficulty urinating and menstrual  problem.  Musculoskeletal: Negative for neck pain and neck stiffness.       Objective:   Physical Exam  Filed Vitals:   09/10/15 1232  BP: 150/90  Pulse: 74  Temp: 98.6 F (37 C)  TempSrc: Oral  Resp: 16  Height: 5' 6.5" (1.689 m)  Weight: 199 lb (90.266 kg)  SpO2: 98%     CONSTITUTIONAL: Well developed/well nourished HEAD: Normocephalic/atraumatic EYES: EOMI/PERRL ENMT: Mucous membranes moist NECK: supple no meningeal signs SPINE/BACK:entire spine nontender CV: S1/S2 noted, no murmurs/rubs/gallops noted LUNGS: Lungs are clear to auscultation bilaterally, no apparent distress ABDOMEN: Bowel sounds are diminshed.  Tender deep right suprapubic region and mild tenderness in the  left lower quadrant.  GU:no cva tenderness NEURO: Pt is awake/alert/appropriate, moves all extremitiesx4.  No facial droop.   EXTREMITIES: pulses normal/equal, full ROM SKIN: warm, color normal PSYCH: no abnormalities of mood noted, alert and oriented to situation Results for orders placed or performed in visit on 09/10/15  POCT CBC  Result Value Ref Range   WBC 10.6 (A) 4.6 - 10.2 K/uL   Lymph, poc 3.3 0.6 - 3.4   POC LYMPH PERCENT 30.9 10 - 50 %L   MID (cbc) 0.7 0 - 0.9   POC MID % 6.6 0 - 12 %M   POC Granulocyte 6.6 2 - 6.9   Granulocyte percent 62.5 37 - 80 %G   RBC 4.50 4.04 - 5.48 M/uL   Hemoglobin 14.0 12.2 - 16.2 g/dL   HCT, POC 41.4 37.7 - 47.9 %   MCV 91.8 80 - 97 fL   MCH, POC 31.1 27 - 31.2 pg   MCHC 33.9 31.8 - 35.4 g/dL   RDW, POC 12.5 %   Platelet Count, POC 303 142 - 424 K/uL   MPV 6.6 0 - 99.8 fL  POCT urine pregnancy  Result Value Ref Range   Preg Test, Ur Negative Negative  POCT urinalysis dipstick  Result Value Ref Range   Color, UA yellow yellow   Clarity, UA clear clear   Glucose, UA negative negative   Bilirubin, UA small (A) negative   Ketones, POC UA small (15) (A) negative   Spec Grav, UA 1.025    Blood, UA small (A) negative   pH, UA 6.0    Protein  Ur, POC negative negative   Urobilinogen, UA 0.2    Nitrite, UA Negative Negative   Leukocytes, UA Negative Negative  POCT Microscopic Urinalysis ([facility])  Result Value Ref Range   WBC,UR,HPF,POC None None WBC/hpf   RBC,UR,HPF,POC Few (A) None RBC/hpf   Bacteria None None   Mucus Present (A) Absent   Epithelial Cells, UR Per Microscopy Many (A) None cells/hpf   [facility] reading (PRIMARY) by  Dr. there is a tampon  in place. There is no evidence of a structural no free air noted lung fields are clear      Assessment & Plan:  Patient going to the emergency room at [facility]. Would consider CT abdomen pelvis rule out appendicitis versus abnormality related to her previous hernia repair.I personally performed the services described in this documentation, which was scribed in my presence. The recorded information has been reviewed and is accurate.

## 2015-09-10 NOTE — ED Provider Notes (Signed)
CSN: [id number]     Arrival date & time 09/10/15  1504 History   First MD Initiated Contact with Patient 09/10/15 1723     Chief Complaint  Patient presents with  . Abdominal Pain     (Consider location/radiation/quality/duration/timing/severity/associated sxs/prior Treatment) Patient is a 44 y.o. female presenting with abdominal pain. The history is provided by the patient. No language interpreter was used.  Abdominal Pain Associated symptoms: chills and vomiting   Associated symptoms: no constipation, no diarrhea, no fever and no nausea   Stacy Hull is a 44 y.o female with a history of umbilical hernia repair (Jan 2016), hyperlipidemia, hypertension, anxiety, and depression who presents for sharp intermittent right lower quadrant pain that radiates into the right groin for the past 3 days. She states she vomited once yesterday due to pain. She also says that she thought it was her menstrual cramps that she usually gets before her period and that resolves once she starts her period. She says her period started yesterday and the pain did not subside. She states it is different from her usual menstrual, crampy like pain. She took ibuprofen 5.5 hours ago. She rates her pain at 6/10 now. She describes it as a pressure in her abdomen and she feels that her abdomen is distended. Pain is relieved with sitting up and is worse when lying supine. She complains of chills last night but denies any fever. She was seen at urgent care prior to arrival here and was sent here due to elevated white count. Her last bowel movement was this morning which she states was normal. She denies any chest pain, shortness of breath, nausea, diarrhea, constipation, hematochezia, dysuria, hematuria, or urinary frequency. She is on the second day of her menstrual cycle.  Past Medical History  Diagnosis Date  . Hyperlipidemia   . HTN (hypertension)   . Anxiety   . Allergic rhinitis   . Depression    Past Surgical History   Procedure Laterality Date  . Cesarean section  2005  . Parathyroidectomy  1999    benign  . Cervical cone biopsy    . Tubal ligation    . Hysteroscopy with novasure  11/26/2012    Procedure: HYSTEROSCOPY WITH NOVASURE;  Surgeon: Marvene Staff, MD;  Location: [facility];  Service: Gynecology;  Laterality: N/A;   Family History  Problem Relation Age of Onset  . Depression Mother   . Hypertension Father   . Aneurysm Father 27    cerebral  . Diabetes Maternal Grandmother   . Diabetes Maternal Grandfather   . Stroke Paternal Grandfather 32   Social History  Substance Use Topics  . Smoking status: Never Smoker   . Smokeless tobacco: Never Used  . Alcohol Use: 2.0 oz/week    4 drink(s) per week     Comment: occasionally   OB History    No data available     Review of Systems  Constitutional: Positive for chills. Negative for fever.  Gastrointestinal: Positive for vomiting and abdominal pain. Negative for nausea, diarrhea, constipation and blood in stool.  All other systems reviewed and are negative.     Allergies  Erythromycin  Home Medications   Prior to Admission medications   Medication Sig Start Date End Date Taking? Authorizing Provider  albuterol (PROVENTIL HFA;VENTOLIN HFA) 108 (90 BASE) MCG/ACT inhaler Inhale 1-2 puffs into the lungs every 4 (four) hours as needed for wheezing or shortness of breath. 09/05/14   Wendie Agreste, MD  ALPRAZolam Duanne Moron)  0.5 MG tablet TAKE 1 TABLET BY MOUTH AT BEDTIME AS NEEDED FOR ANXIETY AND SLEEP 07/23/15   Shawnee Knapp, MD  citalopram (CELEXA) 20 MG tablet TAKE 1 TABLET BY MOUTH DAILY 11/03/14   Shawnee Knapp, MD  HYDROcodone-acetaminophen [facility]) 10-325 MG per tablet Take 1 tablet by mouth every 8 (eight) hours as needed. Patient not taking: Reported on 09/10/2015 12/07/14   Roselee Culver, MD  lisinopril-hydrochlorothiazide (PRINZIDE,ZESTORETIC) 20-12.5 MG per tablet Take 1 tablet by mouth daily. 11/03/14   Shawnee Knapp, MD    Multiple Vitamin (MULTIVITAMIN) tablet Take 1 tablet by mouth daily.    Historical Provider, MD  oxyCODONE-acetaminophen (PERCOCET/ROXICET) 5-325 MG tablet Take 2 tablets by mouth every 4 (four) hours as needed for severe pain. 09/10/15   Deeksha Cotrell Patel-Mills, PA-C   BP 160/101 mmHg  Pulse 76  Temp(Src) 98.7 F (37.1 C) (Oral)  Resp 16  Ht 5' 6.5" (1.689 m)  Wt 198 lb 6.6 oz (89.999 kg)  BMI 31.55 kg/m2  SpO2 98%  LMP 09/09/2015 (Exact Date) Physical Exam  Constitutional: She is oriented to person, place, and time. She appears well-developed and well-nourished.  HENT:  Head: Normocephalic and atraumatic.  Eyes: Conjunctivae are normal.  Neck: Normal range of motion. Neck supple.  Cardiovascular: Normal rate, regular rhythm and normal heart sounds.   Pulmonary/Chest: Effort normal and breath sounds normal. No respiratory distress.  Abdominal: Soft. Normal appearance. She exhibits no distension. There is no tenderness. There is no rebound and no guarding.    Right lower quadrant abdominal tenderness to palpation. No guarding or rebound. No abdominal distention. No CVA tenderness. Negative so as an obturator sign. Negative heel tap. She is ambulatory.  Musculoskeletal: Normal range of motion.  Neurological: She is alert and oriented to person, place, and time.  Skin: Skin is warm and dry.  Psychiatric: She has a normal mood and affect. Her behavior is normal.  Nursing note and vitals reviewed.   ED Course  Procedures (including critical care time) Labs Review Labs Reviewed  COMPREHENSIVE METABOLIC PANEL - Abnormal; Notable for the following:    Glucose, Bld 101 (*)    Calcium 8.8 (*)    ALT 13 (*)    All other components within normal limits  LIPASE, BLOOD  CBC  I-STAT BETA HCG BLOOD, ED ([facility], [facility], [facility] ONLY)    Imaging Review US Transvaginal Non-ob  09/10/2015   CLINICAL DATA:  44 year old female with right lower quadrant and pelvic pain for 3 days. Initial encounter.  EXAM:  TRANSABDOMINAL AND TRANSVAGINAL ULTRASOUND OF PELVIS  DOPPLER ULTRASOUND OF OVARIES  TECHNIQUE: Both transabdominal and transvaginal ultrasound examinations of the pelvis were performed. Transabdominal technique was performed for global imaging of the pelvis including uterus, ovaries, adnexal regions, and pelvic cul-de-sac.  It was necessary to proceed with endovaginal exam following the transabdominal exam to visualize the ovaries. Color and duplex Doppler ultrasound was utilized to evaluate blood flow to the ovaries.  COMPARISON:  CT Abdomen and Pelvis 11/17/2014  FINDINGS: Uterus  Measurements: 7.8 x 4.2 x 4.8 cm. No fibroids or other mass visualized.  Endometrium  Thickness: 7 mm.  No focal abnormality visualized.  Right ovary  Measurements: 2.6 x 1.8 by 3.8 cm. The ovary itself appears within normal limits (images 39 and 40), however, there is a hypervascular area adjacent to the right ovary measuring up to 4 cm as seen on images 34 and 35. See also image 72.  Left ovary  Measurements: 2.5  x 1.8 x 2.4 cm. Normal small follicles. Normal appearance/no adnexal mass.  Pulsed Doppler evaluation of both ovaries demonstrates normal low-resistance arterial and venous waveforms.  Other findings  Trace pelvic free fluid  IMPRESSION: 1. 4 cm complex and hypervascular area adjacent to the right ovary. Recommend followup CT Abdomen and Pelvis with oral and IV contrast to further evaluate. 2. No evidence of ovarian torsion.  Negative uterus and left adnexa.   Electronically Signed   By: Genevie Ann M.D.   On: 09/10/2015 19:39   US Pelvis Complete  09/10/2015   CLINICAL DATA:  44 year old female with right lower quadrant and pelvic pain for 3 days. Initial encounter.  EXAM: TRANSABDOMINAL AND TRANSVAGINAL ULTRASOUND OF PELVIS  DOPPLER ULTRASOUND OF OVARIES  TECHNIQUE: Both transabdominal and transvaginal ultrasound examinations of the pelvis were performed. Transabdominal technique was performed for global imaging of the  pelvis including uterus, ovaries, adnexal regions, and pelvic cul-de-sac.  It was necessary to proceed with endovaginal exam following the transabdominal exam to visualize the ovaries. Color and duplex Doppler ultrasound was utilized to evaluate blood flow to the ovaries.  COMPARISON:  CT Abdomen and Pelvis 11/17/2014  FINDINGS: Uterus  Measurements: 7.8 x 4.2 x 4.8 cm. No fibroids or other mass visualized.  Endometrium  Thickness: 7 mm.  No focal abnormality visualized.  Right ovary  Measurements: 2.6 x 1.8 by 3.8 cm. The ovary itself appears within normal limits (images 39 and 40), however, there is a hypervascular area adjacent to the right ovary measuring up to 4 cm as seen on images 34 and 35. See also image 72.  Left ovary  Measurements: 2.5 x 1.8 x 2.4 cm. Normal small follicles. Normal appearance/no adnexal mass.  Pulsed Doppler evaluation of both ovaries demonstrates normal low-resistance arterial and venous waveforms.  Other findings  Trace pelvic free fluid  IMPRESSION: 1. 4 cm complex and hypervascular area adjacent to the right ovary. Recommend followup CT Abdomen and Pelvis with oral and IV contrast to further evaluate. 2. No evidence of ovarian torsion.  Negative uterus and left adnexa.   Electronically Signed   By: Genevie Ann M.D.   On: 09/10/2015 19:39   Ct Abdomen Pelvis W Contrast  09/10/2015   CLINICAL DATA:  44 year old female with acute right abdominal and pelvic pain for 4 days.  EXAM: CT ABDOMEN AND PELVIS WITH CONTRAST  TECHNIQUE: Multidetector CT imaging of the abdomen and pelvis was performed using the standard protocol following bolus administration of intravenous contrast.  CONTRAST:  113mL OMNIPAQUE IOHEXOL 300 MG/ML  SOLN  COMPARISON:  11/17/2014 CT.  FINDINGS: Lower chest: Tree-in-bud opacities within the right lower lobe are compatible with mild infection.  Hepatobiliary: The liver and gallbladder are unremarkable. There is no evidence of biliary dilatation.  Pancreas: Unremarkable   Spleen: Unremarkable  Adrenals/Urinary Tract: There is inflammation adjacent to the bladder and right adnexal region. Mild circumferential bladder wall thickening is present. The kidneys and adrenal glands are unremarkable.  Stomach/Bowel: There is no evidence of bowel obstruction or focal bowel wall thickening. What appears to be the appendix is normal.  Vascular/Lymphatic: No enlarged lymph nodes or abdominal aortic aneurysm.  Reproductive: Inflammation adjacent to the right ovary is noted. The uterus and left adnexal region is unremarkable.  Other: No free fluid, abscess or pneumoperitoneum.  Musculoskeletal: No acute or suspicious abnormalities.  IMPRESSION: Inflammation adjacent to the bladder and right adnexal region. This may be related to a cystitis or right ovarian abnormality/ruptured cyst. Consider pelvic  ultrasound if there is clinical suspicion for right adnexal process.  Right lower lobe tree-in-bud opacities compatible with mild infection.   Electronically Signed   By: Margarette Canada M.D.   On: 09/10/2015 20:56   [place] Art/ven Flow Abd Pelv Doppler  09/10/2015   CLINICAL DATA:  44 year old female with right lower quadrant and pelvic pain for 3 days. Initial encounter.  EXAM: TRANSABDOMINAL AND TRANSVAGINAL ULTRASOUND OF PELVIS  DOPPLER ULTRASOUND OF OVARIES  TECHNIQUE: Both transabdominal and transvaginal ultrasound examinations of the pelvis were performed. Transabdominal technique was performed for global imaging of the pelvis including uterus, ovaries, adnexal regions, and pelvic cul-de-sac.  It was necessary to proceed with endovaginal exam following the transabdominal exam to visualize the ovaries. Color and duplex Doppler ultrasound was utilized to evaluate blood flow to the ovaries.  COMPARISON:  CT Abdomen and Pelvis 11/17/2014  FINDINGS: Uterus  Measurements: 7.8 x 4.2 x 4.8 cm. No fibroids or other mass visualized.  Endometrium  Thickness: 7 mm.  No focal abnormality visualized.  Right ovary   Measurements: 2.6 x 1.8 by 3.8 cm. The ovary itself appears within normal limits (images 39 and 40), however, there is a hypervascular area adjacent to the right ovary measuring up to 4 cm as seen on images 34 and 35. See also image 72.  Left ovary  Measurements: 2.5 x 1.8 x 2.4 cm. Normal small follicles. Normal appearance/no adnexal mass.  Pulsed Doppler evaluation of both ovaries demonstrates normal low-resistance arterial and venous waveforms.  Other findings  Trace pelvic free fluid  IMPRESSION: 1. 4 cm complex and hypervascular area adjacent to the right ovary. Recommend followup CT Abdomen and Pelvis with oral and IV contrast to further evaluate. 2. No evidence of ovarian torsion.  Negative uterus and left adnexa.   Electronically Signed   By: Genevie Ann M.D.   On: 09/10/2015 19:39   Dg Abd Acute W/chest  09/10/2015   CLINICAL DATA:  44 year old female with acute right-sided abdominal pain for 3 days.  EXAM: DG ABDOMEN ACUTE W/ 1V CHEST  COMPARISON:  09/05/2014  FINDINGS: The cardiomediastinal silhouette is unremarkable.  The lungs are clear.  There is no evidence of airspace disease, pleural effusion or pneumothorax.  Surgical clips overlying the left neck or again identified.  The bowel gas pattern is unremarkable.  There is no evidence of bowel obstruction, pneumoperitoneum or suspicious calcifications.  No acute bony abnormalities are identified.  IMPRESSION: No evidence of acute abnormality.  No evidence of active cardiopulmonary disease.   Electronically Signed   By: Margarette Canada M.D.   On: 09/10/2015 17:17   I have personally reviewed and evaluated these images and lab results as part of my medical decision-making.   EKG Interpretation None      MDM   Final diagnoses:  Cyst of right ovary  Patient presents for RLQ abdominal pain and vomiting x1 for the past 3 days. She is well-appearing and in no acute distress. Her vital signs are stable. Her exam was not concerning. I believe this may be  of pelvic origin. An ultrasound was ordered which shows a 4 cm hypervascular area adjacent to the right ovary with CT abdomen recommendation. Her labs are unremarkable. She is afebrile.  CT abdomen shows normal appendix, inflammation adjacent to the bladder which could be cystic or right ovarian abnormality/ruptured cyst. I discussed findings with the patient as well as follow-up. She was given return precautions. Rx Percocet She was given a work note. She verbally  agrees with the plan.    Ottie Glazier, PA-C 09/11/15 0124  Carmin Muskrat, MD 09/11/15 1517

## 2015-09-10 NOTE — Patient Instructions (Signed)
Please go to [facility] for further evaluation.

## 2015-09-11 ENCOUNTER — Other Ambulatory Visit: Payer: Self-pay | Admitting: Obstetrics and Gynecology

## 2015-09-22 ENCOUNTER — Inpatient Hospital Stay (HOSPITAL_COMMUNITY)
Admission: AD | Admit: 2015-09-22 | Discharge: 2015-09-22 | Disposition: A | Discharge status: Home / Self Care | Payer: BC Managed Care – PPO | Source: Ambulatory Visit | Attending: Obstetrics & Gynecology | Admitting: Obstetrics & Gynecology

## 2015-09-22 ENCOUNTER — Encounter (HOSPITAL_COMMUNITY): Payer: Self-pay

## 2015-09-22 ENCOUNTER — Inpatient Hospital Stay (HOSPITAL_COMMUNITY): Payer: BC Managed Care – PPO

## 2015-09-22 DIAGNOSIS — E785 Hyperlipidemia, unspecified: Secondary | ICD-10-CM | POA: Insufficient documentation

## 2015-09-22 DIAGNOSIS — Z9851 Tubal ligation status: Secondary | ICD-10-CM | POA: Insufficient documentation

## 2015-09-22 DIAGNOSIS — R1031 Right lower quadrant pain: Secondary | ICD-10-CM | POA: Insufficient documentation

## 2015-09-22 DIAGNOSIS — N7011 Chronic salpingitis: Secondary | ICD-10-CM | POA: Insufficient documentation

## 2015-09-22 DIAGNOSIS — R102 Pelvic and perineal pain: Secondary | ICD-10-CM | POA: Diagnosis not present

## 2015-09-22 DIAGNOSIS — I10 Essential (primary) hypertension: Secondary | ICD-10-CM | POA: Insufficient documentation

## 2015-09-22 LAB — CBC
HCT: 41.7 % (ref 36.0–46.0)
Hemoglobin: 14.4 g/dL (ref 12.0–15.0)
MCH: 32.5 pg (ref 26.0–34.0)
MCHC: 34.5 g/dL (ref 30.0–36.0)
MCV: 94.1 fL (ref 78.0–100.0)
Platelets: 275 10*3/uL (ref 150–400)
RBC: 4.43 MIL/uL (ref 3.87–5.11)
RDW: 12.6 % (ref 11.5–15.5)
WBC: 7.6 10*3/uL (ref 4.0–10.5)

## 2015-09-22 LAB — COMPREHENSIVE METABOLIC PANEL
ALT: 16 U/L (ref 14–54)
AST: 19 U/L (ref 15–41)
Albumin: 4.1 g/dL (ref 3.5–5.0)
Alkaline Phosphatase: 51 U/L (ref 38–126)
Anion gap: 7 (ref 5–15)
BUN: 10 mg/dL (ref 6–20)
CO2: 24 mmol/L (ref 22–32)
Calcium: 8.8 mg/dL — ABNORMAL LOW (ref 8.9–10.3)
Chloride: 104 mmol/L (ref 101–111)
Creatinine, Ser: 0.48 mg/dL (ref 0.44–1.00)
GFR calc Af Amer: >60 mL/min (ref >60)
GFR calc non Af Amer: >60 mL/min (ref >60)
Glucose, Bld: 110 mg/dL — ABNORMAL HIGH (ref 65–99)
Potassium: 3.9 mmol/L (ref 3.5–5.1)
Sodium: 135 mmol/L (ref 135–145)
Total Bilirubin: 0.8 mg/dL (ref 0.3–1.2)
Total Protein: 7.5 g/dL (ref 6.5–8.1)

## 2015-09-22 LAB — POCT PREGNANCY, URINE: PREG TEST UR: NEGATIVE

## 2015-09-22 MED ORDER — PROMETHAZINE HCL 25 MG/ML IJ SOLN
25.0000 mg | INTRAMUSCULAR | Status: AC
  Administered 2015-09-22: 25 mg via INTRAVENOUS
  Filled 2015-09-22: qty 1

## 2015-09-22 MED ORDER — LACTATED RINGERS IV BOLUS (SEPSIS)
300.0000 mL | Freq: Once | INTRAVENOUS | Status: AC
  Administered 2015-09-22: 300 mL via INTRAVENOUS

## 2015-09-22 MED ORDER — NORETHINDRONE ACETATE 5 MG PO TABS: 5.0000 mg | ORAL_TABLET | Freq: Two times a day (BID) | ORAL | Status: DC

## 2015-09-22 MED ORDER — BUTORPHANOL TARTRATE 1 MG/ML IJ SOLN
2.0000 mg | Freq: Once | INTRAMUSCULAR | Status: AC
  Administered 2015-09-22: 2 mg via INTRAVENOUS
  Filled 2015-09-22: qty 2

## 2015-09-22 MED ORDER — LACTATED RINGERS IV SOLN
INTRAVENOUS | Status: DC
  Administered 2015-09-22: 09:00:00 via INTRAVENOUS

## 2015-09-22 NOTE — MAU Note (Signed)
Notified provider of patient Stacy Hull c/o right lower abdominal pain and history of a ruptured ovarian cyst. Provider told me to do a pregnancy test then to notify the midwife.

## 2015-09-22 NOTE — Discharge Instructions (Signed)
Pelvic Pain, Female Pelvic pain is pain felt below the belly button and between your hips. It can be caused by many different things. It is important to get help right away. This is especially true for severe, sharp, or unusual pain that comes on suddenly.  HOME CARE  Only take medicine as told by your doctor.  Rest as told by your doctor.  Eat a healthy diet, such as fruits, vegetables, and lean meats.  Drink enough fluids to keep your pee (urine) clear or pale yellow, or as told.  Avoid sex (intercourse) if it causes pain.  Apply warm or cold packs to your lower belly (abdomen). Use the type of pack that helps the pain.  Avoid situations that cause you stress.  Keep a journal to track your pain. Write down:  When the pain started.  Where it is located.  If there are things that seem to be related to the pain, such as food or your period.  Follow up with your doctor as told. GET HELP RIGHT AWAY IF:   You have heavy bleeding from the vagina.  You have more pelvic pain.  You feel lightheaded or pass out (faint).  You have chills.  You have pain when you pee or have blood in your pee.  You cannot stop having watery poop (diarrhea).  You cannot stop throwing up (vomiting).  You have a fever or lasting symptoms for more than 3 days.  You have a fever and your symptoms suddenly get worse.  You are being physically or sexually abused.  Your medicine does not help your pain.  You have fluid (discharge) coming from your vagina that is not normal. MAKE SURE YOU:  Understand these instructions.  Will watch your condition.  Will get help if you are not doing well or get worse.   This information is not intended to replace advice given to you by your health care provider. Make sure you discuss any questions you have with your health care provider.   Document Released: 05/05/2008 Document Revised: 12/08/2014 Document Reviewed: 03/08/2012 Elsevier Interactive Patient  Education [employer].

## 2015-09-22 NOTE — MAU Note (Signed)
Notified provider of patient Stacy Hull c/o right lower abdominal pain and history of ovarian cyst. Pregnancy test negative. Provider said she would come see the patient.

## 2015-09-22 NOTE — MAU Provider Note (Signed)
History    CSN: [id number] Arrival date and time: 09/22/15 6568 Nurse call to provider [phone] Provider here to see patient @ [phone]  Chief Complaint  Patient presents with  . Abdominal Pain   HPI 44 yo female, C/s x2, TL with 2nd C/s in 2010, Novasure endometrial ablation in Dec'13 for menorrhagia. Umbilical hernia repair (with mesh) in Jan'16.  She is presenting to MAU for worsening RLQ/pelvic pain, started on 10/10 but getting worse, Percocet was not helping.  CT/ sono on 10/10 noted suspected hemorrhagic right ovarian cyst. She presented to office on 10/20 with more pain and had pelvic sono in office that noted an adnexal mass with some enlargement (~3 x 2 x 2 cm) and was started on Augmentin for possible infection and given Dilaudid and Ibuprofen. Dilaudid caused a lot of itching, so stopped and resumed Percocet/ Ibuprofen.  She presented back this morning to MAU with more pain, severe cramps "labor like when you are ready for epidural". Some nausea from pain and narcotic meds, no fever/chills. She is having cyclic periods since ablation that was in Dec'13, flow has reduced but now she is noting more cramps since 4-5 months and right pelvic pain since 3 cycles, but really worse this month and not resolving.   Non smoker, Hypertensive.   Past Medical History  Diagnosis Date  . Hyperlipidemia   . HTN (hypertension)   . Anxiety   . Allergic rhinitis   . Depression     Past Surgical History  Procedure Laterality Date  . Cesarean section  2005  . Parathyroidectomy  1999    benign  . Cervical cone biopsy    . Tubal ligation    . Hysteroscopy with novasure  11/26/2012    Procedure: HYSTEROSCOPY WITH NOVASURE;  Surgeon: Marvene Staff, MD;  Location: [facility];  Service: Gynecology;  Laterality: N/A;  . Umbilical hernia repair      Family History  Problem Relation Age of Onset  . Depression Mother   . Hypertension Father   . Aneurysm Father 44    cerebral  . Diabetes  Maternal Grandmother   . Diabetes Maternal Grandfather   . Stroke Paternal Grandfather 94    Social History  Substance Use Topics  . Smoking status: Never Smoker   . Smokeless tobacco: Never Used  . Alcohol Use: 2.0 oz/week    4 Standard drinks or equivalent per week     Comment: occasionally    Allergies:  Allergies  Allergen Reactions  . Erythromycin Other (See Comments)    G I upset.    Prescriptions prior to admission  Medication Sig Dispense Refill Last Dose  . amoxicillin-clavulanate (AUGMENTIN) 500-125 MG tablet Take 1 tablet by mouth 3 (three) times daily. 1 every 8 hours.   09/20/2015 at Unknown time  . citalopram (CELEXA) 20 MG tablet TAKE 1 TABLET BY MOUTH DAILY 90 tablet 3 09/21/2015 at Unknown time  . diphenhydrAMINE (BENADRYL) 25 MG tablet Take 25 mg by mouth every 6 (six) hours as needed for allergies.   prn  . ibuprofen (ADVIL,MOTRIN) 800 MG tablet Take 800 mg by mouth every 8 (eight) hours as needed for fever or moderate pain.   09/22/2015 at Unknown time  . lisinopril-hydrochlorothiazide (PRINZIDE,ZESTORETIC) 20-12.5 MG per tablet Take 1 tablet by mouth daily. 90 tablet 3 09/21/2015 at Unknown time  . Multiple Vitamin (MULTIVITAMIN) tablet Take 1 tablet by mouth daily.   09/21/2015 at Unknown time  . oxyCODONE-acetaminophen (PERCOCET/ROXICET) 5-325 MG tablet Take  2 tablets by mouth every 4 (four) hours as needed for severe pain. 6 tablet 0 09/22/2015 at 0600  . albuterol (PROVENTIL HFA;VENTOLIN HFA) 108 (90 BASE) MCG/ACT inhaler Inhale 1-2 puffs into the lungs every 4 (four) hours as needed for wheezing or shortness of breath. 1 Inhaler 0 rescue at Unknown time  . ALPRAZolam (XANAX) 0.5 MG tablet TAKE 1 TABLET BY MOUTH AT BEDTIME AS NEEDED FOR ANXIETY AND SLEEP 30 tablet 2 prn at Unknown time  . HYDROcodone-acetaminophen (NORCO) 10-325 MG per tablet Take 1 tablet by mouth every 8 (eight) hours as needed. (Patient not taking: Reported on 09/10/2015) 30 tablet 0 Not  Taking   ROS  Abdominal pain right lower quadrant Normal BM yesterday - no constipation but slightly hard stool Pain medicine "barely masking" pain now  Persistent nausea - vomiting from pain at times Percocet at 0600 - no relief this am  Physical Exam   Blood pressure 140/87, pulse 72, temperature 98.5 F (36.9 C), temperature source Oral, resp. rate 16, last menstrual period 09/09/2015, SpO2 97 %.  Physical Exam Alert and oriented / appears uncomfortable / tearful at times Heart RRR Lungs normal respiratory effort  Abdomen soft / non-distended / +BS / no tenderness in upper quadrants                  Marked tenderness right lower quadrant and suprapubic area  Pelvic deferred at present  MAU Course  Procedures Labs:  CBC-normal, no elevated WBC, H/H stable. CMP normal.   Pelvic sono -  Uterus- Measurements: 4.0 x 5.6 x 8.3 cm. No fibroids or other mass visualized. Endometrium Thickness: 5.6 mm. No focal abnormality visualized. Right ovary-  Measurements: 1.8 x 2.0 x 3.1 cm. There is an oval lobulated heterogeneous mass in the right adnexa abutting the right ovary and adjacent uterus measuring 3.5 x 5.4 x 5.8 cm with mild internal vascularity. It is difficult to distinguish whether this represents an ovarian process, fallopian tube abnormality versus serosal fibroid. This is unchanged to slightly larger given differences in scan plane measurement compared to the prior ultrasound. Left ovary-  Measurements: 2.1 x 2.3 x 3.1 cm. Normal appearance/no adnexal mass.  Other findings: No free fluid  Assessment and Plan  44 yo female, post-endometrial ablation cyclic pain, getting worse in 3 months, esp RLQ, now noting 5-6 cm adnexal mass consistent with right hydrosalpinx. No clinical evidence of infection/ PID.  Reviewed findings and medical decision making with patient and she voiced understanding.  She will need surgery ie laparoscopic bilateral salpingectomy and  possible hysterectomy after further counseling with her primary Gyn Dr cousins. She is stable and does not have any acute indication for emergency surgery, especially with prior C/sx2 and Umbilical hernia repair.   Plan Pain management with Percocet/ Ibuprofen alternating and add Aygestin PO 78m bid x 3 days and then daily until surgery to suppress menses.  Add Zofran for nausea/ vomiting.  F/up with Dr CGarwin Brothersin office to plan further management and surgery.   Patient voiced understanding. D/c home from MAU.   -- VAzucena Fallen MD

## 2015-09-22 NOTE — MAU Note (Addendum)
Was told she had a ruptured ovarian cyst 09/10/2015, was seen at [facility] ER.  Having increased pain at right lower abd area.  Spotting.  No fever.  Having nausea. Hx of tubal ligation but was concerned whether a pregnancy test has been done.

## 2015-09-22 NOTE — Progress Notes (Signed)
Spoke with patient and pain is a 5/10. She has had multiple follow up appointments and had ultrasounds and said that providers are concerned about infection

## 2015-09-27 ENCOUNTER — Encounter (HOSPITAL_COMMUNITY)
Admission: RE | Admit: 2015-09-27 | Discharge: 2015-09-27 | Disposition: A | Discharge status: Home / Self Care | Payer: BC Managed Care – PPO | Source: Ambulatory Visit | Attending: Obstetrics & Gynecology | Admitting: Obstetrics & Gynecology

## 2015-09-27 ENCOUNTER — Encounter (HOSPITAL_COMMUNITY): Payer: Self-pay

## 2015-09-27 ENCOUNTER — Other Ambulatory Visit: Payer: Self-pay

## 2015-09-27 DIAGNOSIS — Z01818 Encounter for other preprocedural examination: Secondary | ICD-10-CM | POA: Insufficient documentation

## 2015-09-27 HISTORY — DX: Pneumonia, unspecified organism: J18.9

## 2015-09-27 NOTE — Pre-Procedure Instructions (Signed)
Patient had an abnormal EKG at [facility] appointment. Dr. Jillyn Hidden reviewed and no new order received.

## 2015-09-27 NOTE — Patient Instructions (Addendum)
Your procedure is scheduled on: October 01, 2015   Enter through the Main Entrance of [facility] at:  8:30 am   Pick up the phone at the desk and dial [phone].  Call this number if you have problems the morning of surgery: [phone].  Remember: Do NOT eat food: after midnight on Sunday night  Do NOT drink clear liquids after:  Midnight on Sunday  Take these medicines the morning of surgery with a SIP OF WATER:  Celexa, lisinopril-hydrochlorothiazide, and xanax if needed   Do NOT wear jewelry (body piercing), metal hair clips/bobby pins, make-up, or nail polish. Do NOT wear lotions, powders, or perfumes.  You may wear deoderant. Do NOT shave for 48 hours prior to surgery. Do NOT bring valuables to the hospital.al, checkout time is 11:00 AM the day of discharge. Contacts, dentures, or bridgework may not be worn into surgery. Have a responsible adult drive you home and stay with you for 24 hours after your procedure

## 2015-09-28 ENCOUNTER — Other Ambulatory Visit: Payer: Self-pay | Admitting: Obstetrics & Gynecology

## 2015-10-01 ENCOUNTER — Encounter (HOSPITAL_COMMUNITY)
Admission: RE | Disposition: A | Discharge status: Home / Self Care | Payer: Self-pay | Source: Ambulatory Visit | Attending: Obstetrics & Gynecology

## 2015-10-01 ENCOUNTER — Ambulatory Visit (HOSPITAL_COMMUNITY): Payer: BC Managed Care – PPO | Admitting: Anesthesiology

## 2015-10-01 ENCOUNTER — Encounter (HOSPITAL_COMMUNITY): Payer: Self-pay | Admitting: Anesthesiology

## 2015-10-01 ENCOUNTER — Ambulatory Visit (HOSPITAL_COMMUNITY)
Admission: RE | Admit: 2015-10-01 | Discharge: 2015-10-01 | Disposition: A | Discharge status: Home / Self Care | Payer: BC Managed Care – PPO | Source: Ambulatory Visit | Attending: Obstetrics & Gynecology | Admitting: Obstetrics & Gynecology

## 2015-10-01 DIAGNOSIS — R102 Pelvic and perineal pain: Secondary | ICD-10-CM | POA: Insufficient documentation

## 2015-10-01 DIAGNOSIS — I10 Essential (primary) hypertension: Secondary | ICD-10-CM | POA: Diagnosis not present

## 2015-10-01 DIAGNOSIS — F329 Major depressive disorder, single episode, unspecified: Secondary | ICD-10-CM | POA: Insufficient documentation

## 2015-10-01 DIAGNOSIS — F419 Anxiety disorder, unspecified: Secondary | ICD-10-CM | POA: Insufficient documentation

## 2015-10-01 DIAGNOSIS — N803 Endometriosis of pelvic peritoneum: Secondary | ICD-10-CM | POA: Insufficient documentation

## 2015-10-01 DIAGNOSIS — Z9851 Tubal ligation status: Secondary | ICD-10-CM | POA: Insufficient documentation

## 2015-10-01 DIAGNOSIS — Z881 Allergy status to other antibiotic agents status: Secondary | ICD-10-CM | POA: Insufficient documentation

## 2015-10-01 DIAGNOSIS — N736 Female pelvic peritoneal adhesions (postinfective): Secondary | ICD-10-CM | POA: Insufficient documentation

## 2015-10-01 DIAGNOSIS — N83201 Unspecified ovarian cyst, right side: Secondary | ICD-10-CM | POA: Diagnosis not present

## 2015-10-01 DIAGNOSIS — E785 Hyperlipidemia, unspecified: Secondary | ICD-10-CM | POA: Diagnosis not present

## 2015-10-01 DIAGNOSIS — N838 Other noninflammatory disorders of ovary, fallopian tube and broad ligament: Secondary | ICD-10-CM | POA: Diagnosis not present

## 2015-10-01 HISTORY — PX: ROBOTIC ASSISTED LAPAROSCOPIC OVARIAN CYSTECTOMY: SHX6081

## 2015-10-01 LAB — TYPE AND SCREEN
ABO/RH(D): O POS
ANTIBODY SCREEN: NEGATIVE

## 2015-10-01 LAB — ABO/RH: ABO/RH(D): O POS

## 2015-10-01 LAB — PREGNANCY, URINE: Preg Test, Ur: NEGATIVE

## 2015-10-01 SURGERY — ROBOTIC ASSISTED LAPAROSCOPIC OVARIAN CYSTECTOMY
Anesthesia: General | Site: Abdomen | Laterality: Right

## 2015-10-01 MED ORDER — BUPIVACAINE HCL (PF) 0.25 % IJ SOLN
INTRAMUSCULAR | Status: DC | PRN
  Administered 2015-10-01: 15 mL

## 2015-10-01 MED ORDER — OXYCODONE-ACETAMINOPHEN 7.5-325 MG PO TABS: 1.0000 | ORAL_TABLET | ORAL | Status: DC | PRN

## 2015-10-01 MED ORDER — FENTANYL CITRATE (PF) 100 MCG/2ML IJ SOLN
25.0000 ug | INTRAMUSCULAR | Status: DC | PRN
  Administered 2015-10-01: 50 ug via INTRAVENOUS

## 2015-10-01 MED ORDER — CEFAZOLIN SODIUM-DEXTROSE 2-3 GM-% IV SOLR
2.0000 g | INTRAVENOUS | Status: AC
  Administered 2015-10-01: 2 g via INTRAVENOUS

## 2015-10-01 MED ORDER — FENTANYL CITRATE (PF) 100 MCG/2ML IJ SOLN
INTRAMUSCULAR | Status: DC | PRN
  Administered 2015-10-01: 100 ug via INTRAVENOUS
  Administered 2015-10-01 (×3): 50 ug via INTRAVENOUS

## 2015-10-01 MED ORDER — DEXAMETHASONE SODIUM PHOSPHATE 10 MG/ML IJ SOLN
INTRAMUSCULAR | Status: DC | PRN
  Administered 2015-10-01: 4 mg via INTRAVENOUS

## 2015-10-01 MED ORDER — NEOSTIGMINE METHYLSULFATE 10 MG/10ML IV SOLN
INTRAVENOUS | Status: DC | PRN
  Administered 2015-10-01: 3 mg via INTRAVENOUS

## 2015-10-01 MED ORDER — ONDANSETRON HCL 4 MG/2ML IJ SOLN
INTRAMUSCULAR | Status: AC
  Filled 2015-10-01: qty 2

## 2015-10-01 MED ORDER — GLYCOPYRROLATE 0.2 MG/ML IJ SOLN
INTRAMUSCULAR | Status: AC
  Filled 2015-10-01: qty 3

## 2015-10-01 MED ORDER — BUPIVACAINE HCL (PF) 0.25 % IJ SOLN
INTRAMUSCULAR | Status: AC
  Filled 2015-10-01: qty 60

## 2015-10-01 MED ORDER — PHENYLEPHRINE HCL 10 MG/ML IJ SOLN
INTRAMUSCULAR | Status: DC | PRN
  Administered 2015-10-01 (×3): 80 ug via INTRAVENOUS

## 2015-10-01 MED ORDER — DEXAMETHASONE SODIUM PHOSPHATE 4 MG/ML IJ SOLN
INTRAMUSCULAR | Status: AC
  Filled 2015-10-01: qty 1

## 2015-10-01 MED ORDER — CEFAZOLIN SODIUM-DEXTROSE 2-3 GM-% IV SOLR
INTRAVENOUS | Status: AC
  Filled 2015-10-01: qty 50

## 2015-10-01 MED ORDER — NEOSTIGMINE METHYLSULFATE 10 MG/10ML IV SOLN
INTRAVENOUS | Status: AC
  Filled 2015-10-01: qty 1

## 2015-10-01 MED ORDER — PROPOFOL 10 MG/ML IV BOLUS
INTRAVENOUS | Status: AC
  Filled 2015-10-01: qty 20

## 2015-10-01 MED ORDER — LIDOCAINE HCL (CARDIAC) 20 MG/ML IV SOLN
INTRAVENOUS | Status: DC | PRN
  Administered 2015-10-01: 80 mg via INTRAVENOUS

## 2015-10-01 MED ORDER — SCOPOLAMINE 1 MG/3DAYS TD PT72
1.0000 | MEDICATED_PATCH | Freq: Once | TRANSDERMAL | Status: DC
  Administered 2015-10-01: 1.5 mg via TRANSDERMAL

## 2015-10-01 MED ORDER — FENTANYL CITRATE (PF) 250 MCG/5ML IJ SOLN
INTRAMUSCULAR | Status: AC
  Filled 2015-10-01: qty 5

## 2015-10-01 MED ORDER — ROCURONIUM BROMIDE 100 MG/10ML IV SOLN
INTRAVENOUS | Status: DC | PRN
  Administered 2015-10-01: 30 mg via INTRAVENOUS
  Administered 2015-10-01: 10 mg via INTRAVENOUS
  Administered 2015-10-01: 40 mg via INTRAVENOUS
  Administered 2015-10-01: 20 mg via INTRAVENOUS

## 2015-10-01 MED ORDER — LACTATED RINGERS IR SOLN
Status: DC | PRN
  Administered 2015-10-01: 3000 mL

## 2015-10-01 MED ORDER — ALBUTEROL SULFATE HFA 108 (90 BASE) MCG/ACT IN AERS
INHALATION_SPRAY | RESPIRATORY_TRACT | Status: DC | PRN
  Administered 2015-10-01: 2 via RESPIRATORY_TRACT

## 2015-10-01 MED ORDER — OXYCODONE-ACETAMINOPHEN 5-325 MG PO TABS
1.0000 | ORAL_TABLET | Freq: Once | ORAL | Status: AC
  Administered 2015-10-01: 1 via ORAL

## 2015-10-01 MED ORDER — MIDAZOLAM HCL 2 MG/2ML IJ SOLN
INTRAMUSCULAR | Status: AC
  Filled 2015-10-01: qty 2

## 2015-10-01 MED ORDER — ROPIVACAINE HCL 5 MG/ML IJ SOLN
INTRAMUSCULAR | Status: DC | PRN
  Administered 2015-10-01: 60 mL

## 2015-10-01 MED ORDER — LACTATED RINGERS IV SOLN
INTRAVENOUS | Status: DC
  Administered 2015-10-01 (×3): via INTRAVENOUS

## 2015-10-01 MED ORDER — METOCLOPRAMIDE HCL 5 MG/ML IJ SOLN: 10.0000 mg | Freq: Once | INTRAMUSCULAR | Status: DC | PRN

## 2015-10-01 MED ORDER — ONDANSETRON HCL 4 MG/2ML IJ SOLN
INTRAMUSCULAR | Status: DC | PRN
  Administered 2015-10-01: 4 mg via INTRAVENOUS

## 2015-10-01 MED ORDER — EPHEDRINE SULFATE 50 MG/ML IJ SOLN
INTRAMUSCULAR | Status: DC | PRN
  Administered 2015-10-01: 5 mg via INTRAVENOUS

## 2015-10-01 MED ORDER — FENTANYL CITRATE (PF) 100 MCG/2ML IJ SOLN
INTRAMUSCULAR | Status: AC
  Filled 2015-10-01: qty 2

## 2015-10-01 MED ORDER — SCOPOLAMINE 1 MG/3DAYS TD PT72
MEDICATED_PATCH | TRANSDERMAL | Status: AC
  Administered 2015-10-01: 1.5 mg via TRANSDERMAL
  Filled 2015-10-01: qty 1

## 2015-10-01 MED ORDER — GLYCOPYRROLATE 0.2 MG/ML IJ SOLN
INTRAMUSCULAR | Status: DC | PRN
  Administered 2015-10-01: 0.6 mg via INTRAVENOUS

## 2015-10-01 MED ORDER — MEPERIDINE HCL 25 MG/ML IJ SOLN: 6.2500 mg | INTRAMUSCULAR | Status: DC | PRN

## 2015-10-01 MED ORDER — MIDAZOLAM HCL 2 MG/2ML IJ SOLN
INTRAMUSCULAR | Status: DC | PRN
  Administered 2015-10-01: 2 mg via INTRAVENOUS

## 2015-10-01 MED ORDER — LIDOCAINE HCL (CARDIAC) 20 MG/ML IV SOLN
INTRAVENOUS | Status: AC
  Filled 2015-10-01: qty 5

## 2015-10-01 MED ORDER — HYDROMORPHONE HCL 1 MG/ML IJ SOLN: 0.2500 mg | INTRAMUSCULAR | Status: DC | PRN

## 2015-10-01 MED ORDER — SODIUM CHLORIDE 0.9 % IJ SOLN
INTRAMUSCULAR | Status: AC
  Filled 2015-10-01: qty 50

## 2015-10-01 MED ORDER — ROCURONIUM BROMIDE 100 MG/10ML IV SOLN
INTRAVENOUS | Status: AC
  Filled 2015-10-01: qty 1

## 2015-10-01 MED ORDER — OXYCODONE-ACETAMINOPHEN 5-325 MG PO TABS
ORAL_TABLET | ORAL | Status: AC
  Filled 2015-10-01: qty 1

## 2015-10-01 MED ORDER — ROPIVACAINE HCL 5 MG/ML IJ SOLN
INTRAMUSCULAR | Status: AC
Start: 2015-10-01 — End: 2015-10-01
  Filled 2015-10-01: qty 30

## 2015-10-01 MED ORDER — HYDROCODONE-ACETAMINOPHEN 7.5-325 MG PO TABS: 1.0000 | ORAL_TABLET | Freq: Once | ORAL | Status: DC | PRN

## 2015-10-01 SURGICAL SUPPLY — 76 items
APPLICATOR COTTON TIP 6IN STRL (MISCELLANEOUS) ×4 IMPLANT
BARRIER ADHS 3X4 INTERCEED (GAUZE/BANDAGES/DRESSINGS) ×4 IMPLANT
CABLE HIGH FREQUENCY MONO STRZ (ELECTRODE) IMPLANT
CATH ROBINSON RED A/P 16FR (CATHETERS) ×4 IMPLANT
CHLORAPREP W/TINT 26ML (MISCELLANEOUS) ×4 IMPLANT
CLOTH BEACON ORANGE TIMEOUT ST (SAFETY) ×4 IMPLANT
CONT PATH 16OZ SNAP LID 3702 (MISCELLANEOUS) ×4 IMPLANT
COVER BACK TABLE 60X90IN (DRAPES) ×8 IMPLANT
COVER TIP SHEARS 8 DVNC (MISCELLANEOUS) ×4 IMPLANT
COVER TIP SHEARS 8MM DA VINCI (MISCELLANEOUS) ×4
DECANTER SPIKE VIAL GLASS SM (MISCELLANEOUS) ×4 IMPLANT
DRAPE WARM FLUID 44X44 (DRAPE) ×4 IMPLANT
DRSG COVADERM PLUS 2X2 (GAUZE/BANDAGES/DRESSINGS) ×16 IMPLANT
DRSG OPSITE POSTOP 3X4 (GAUZE/BANDAGES/DRESSINGS) ×4 IMPLANT
ELECT REM PT RETURN 9FT ADLT (ELECTROSURGICAL) ×4
ELECTRODE REM PT RTRN 9FT ADLT (ELECTROSURGICAL) ×2 IMPLANT
GAUZE VASELINE 3X9 (GAUZE/BANDAGES/DRESSINGS) IMPLANT
GLOVE BIO SURGEON STRL SZ 6.5 (GLOVE) ×3 IMPLANT
GLOVE BIO SURGEONS STRL SZ 6.5 (GLOVE) ×1
GLOVE BIOGEL PI IND STRL 7.0 (GLOVE) ×2 IMPLANT
GLOVE BIOGEL PI INDICATOR 7.0 (GLOVE) ×2
GLOVE SURG SS PI 7.0 STRL IVOR (GLOVE) ×16 IMPLANT
GOWN STRL REUS W/TWL LRG LVL3 (GOWN DISPOSABLE) ×12 IMPLANT
HEMOSTAT SURGICEL 2X3 (HEMOSTASIS) ×4 IMPLANT
IV STOPCOCK 4 WAY 40  W/Y SET (IV SOLUTION) ×2
IV STOPCOCK 4 WAY 40 W/Y SET (IV SOLUTION) ×2 IMPLANT
KIT ACCESSORY DA VINCI DISP (KITS) ×2
KIT ACCESSORY DVNC DISP (KITS) ×2 IMPLANT
LIQUID BAND (GAUZE/BANDAGES/DRESSINGS) ×4 IMPLANT
MANIPULATOR UTERINE 4.5 ZUMI (MISCELLANEOUS) IMPLANT
NEEDLE HYPO 22GX1.5 SAFETY (NEEDLE) ×4 IMPLANT
OCCLUDER COLPOPNEUMO (BALLOONS) ×4 IMPLANT
PACK LAPAROSCOPY BASIN (CUSTOM PROCEDURE TRAY) ×4 IMPLANT
PACK ROBOT WH (CUSTOM PROCEDURE TRAY) ×4 IMPLANT
PACK ROBOTIC GOWN (GOWN DISPOSABLE) ×8 IMPLANT
PAD POSITIONING PINK XL (MISCELLANEOUS) ×4 IMPLANT
PAD PREP 24X48 CUFFED NSTRL (MISCELLANEOUS) ×8 IMPLANT
PENCIL BUTTON HOLSTER BLD 10FT (ELECTRODE) ×4 IMPLANT
POUCH SPECIMEN RETRIEVAL 10MM (ENDOMECHANICALS) IMPLANT
SEALER TISSUE G2 CVD JAW 35 (ENDOMECHANICALS) IMPLANT
SEALER TISSUE G2 CVD JAW 45CM (ENDOMECHANICALS) IMPLANT
SET CYSTO W/LG BORE CLAMP LF (SET/KITS/TRAYS/PACK) IMPLANT
SET IRRIG TUBING LAPAROSCOPIC (IRRIGATION / IRRIGATOR) ×8 IMPLANT
SET TRI-LUMEN FLTR TB AIRSEAL (TUBING) ×4 IMPLANT
SLEEVE XCEL OPT CAN 5 100 (ENDOMECHANICALS) IMPLANT
SUT MNCRL AB 4-0 PS2 18 (SUTURE) ×8 IMPLANT
SUT MON AB 4-0 PS1 27 (SUTURE) ×12 IMPLANT
SUT VIC AB 0 CT1 27 (SUTURE)
SUT VIC AB 0 CT1 27XBRD ANTBC (SUTURE) IMPLANT
SUT VIC AB 0 CT2 27 (SUTURE) IMPLANT
SUT VIC AB 2-0 CT1 27 (SUTURE)
SUT VIC AB 2-0 CT1 TAPERPNT 27 (SUTURE) IMPLANT
SUT VIC AB 3-0 SH 27 (SUTURE)
SUT VIC AB 3-0 SH 27X BRD (SUTURE) IMPLANT
SUT VIC AB 4-0 PS2 27 (SUTURE) ×8 IMPLANT
SUT VICRYL 0 UR6 27IN ABS (SUTURE) ×8 IMPLANT
SYR 50ML LL SCALE MARK (SYRINGE) ×4 IMPLANT
SYSTEM CONVERTIBLE TROCAR (TROCAR) IMPLANT
TIP UTERINE 5.1X6CM LAV DISP (MISCELLANEOUS) IMPLANT
TIP UTERINE 6.7X10CM GRN DISP (MISCELLANEOUS) IMPLANT
TIP UTERINE 6.7X6CM WHT DISP (MISCELLANEOUS) IMPLANT
TIP UTERINE 6.7X8CM BLUE DISP (MISCELLANEOUS) ×4 IMPLANT
TOWEL OR 17X24 6PK STRL BLUE (TOWEL DISPOSABLE) ×12 IMPLANT
TRAY FOLEY BAG SILVER LF 16FR (SET/KITS/TRAYS/PACK) ×4 IMPLANT
TRAY FOLEY CATH SILVER 14FR (SET/KITS/TRAYS/PACK) ×4 IMPLANT
TROCAR 12M 150ML BLUNT (TROCAR) ×4 IMPLANT
TROCAR BALLN 12MMX100 BLUNT (TROCAR) ×4 IMPLANT
TROCAR DISP BLADELESS 8 DVNC (TROCAR) ×2 IMPLANT
TROCAR DISP BLADELESS 8MM (TROCAR) ×2
TROCAR OPTI TIP 12M 100M (ENDOMECHANICALS) IMPLANT
TROCAR PORT AIRSEAL 5X120 (TROCAR) ×8 IMPLANT
TROCAR XCEL 12X100 BLDLESS (ENDOMECHANICALS) ×4 IMPLANT
TROCAR XCEL NON-BLD 11X100MML (ENDOMECHANICALS) ×4 IMPLANT
TROCAR XCEL NON-BLD 5MMX100MML (ENDOMECHANICALS) ×4 IMPLANT
WARMER LAPAROSCOPE (MISCELLANEOUS) ×4 IMPLANT
WATER STERILE IRR 1000ML POUR (IV SOLUTION) ×12 IMPLANT

## 2015-10-01 NOTE — Anesthesia Procedure Notes (Signed)
Procedure Name: Intubation Date/Time: 10/01/2015 10:13 AM Performed by: Omolola Mittman, Sheron Nightingale Pre-anesthesia Checklist: Patient identified, Emergency Drugs available, Timeout performed, Suction available and Patient being monitored Patient Re-evaluated:Patient Re-evaluated prior to inductionOxygen Delivery Method: Circle system utilized Preoxygenation: Pre-oxygenation with 100% oxygen Intubation Type: IV induction Ventilation: Mask ventilation without difficulty Laryngoscope Size: Mac and 3 Grade View: Grade II Tube type: Oral Tube size: 7.0 mm Number of attempts: 1 Placement Confirmation: positive ETCO2,  ETT inserted through vocal cords under direct vision and breath sounds checked- equal and bilateral Secured at: 22 cm Tube secured with: Tape Dental Injury: Teeth and Oropharynx as per pre-operative assessment

## 2015-10-01 NOTE — Discharge Summary (Signed)
  Physician Discharge Summary  Patient ID: QUANDRA FEDORCHAK MRN: [id number] DOB/AGE: Jun 09, 1971 44 y.o.  Admit date: 10/01/2015 Discharge date: 10/01/2015  Admission Diagnoses:  Persistent right pelvic pain with right adnexal cystic lesion.  H/O bilateral tubal sterilization.  Discharge Diagnoses: Same and mild pelvic Endometriosis         Active Problems:   * No active hospital problems. *   Discharged Condition: good  Hospital Course: Outpatient  Consults: None  Treatments: surgery: Robotic Laparoscopic Bilateral Salpingectomy with Right Adnexal Cystectomy, Lysis of Adhesions and Treatment of Endometriosis  Disposition: 01-Home or Self Care     Medication List    STOP taking these medications        amoxicillin-clavulanate 500-125 MG tablet  Commonly known as:  AUGMENTIN     ibuprofen 800 MG tablet  Commonly known as:  ADVIL,MOTRIN     oxyCODONE-acetaminophen 5-325 MG tablet  Commonly known as:  PERCOCET/ROXICET  Replaced by:  oxyCODONE-acetaminophen 7.5-325 MG tablet      TAKE these medications        albuterol 108 (90 BASE) MCG/ACT inhaler  Commonly known as:  PROVENTIL HFA;VENTOLIN HFA  Inhale 1-2 puffs into the lungs every 4 (four) hours as needed for wheezing or shortness of breath.     ALPRAZolam 0.5 MG tablet  Commonly known as:  XANAX  TAKE 1 TABLET BY MOUTH AT BEDTIME AS NEEDED FOR ANXIETY AND SLEEP     citalopram 20 MG tablet  Commonly known as:  CELEXA  TAKE 1 TABLET BY MOUTH DAILY     diphenhydrAMINE 25 MG tablet  Commonly known as:  BENADRYL  Take 25 mg by mouth every 6 (six) hours as needed for allergies.     fexofenadine 180 MG tablet  Commonly known as:  ALLEGRA  Take 180 mg by mouth daily as needed for allergies or rhinitis.     lisinopril-hydrochlorothiazide 20-12.5 MG tablet  Commonly known as:  PRINZIDE,ZESTORETIC  Take 1 tablet by mouth daily.     multivitamin tablet  Take 1 tablet by mouth daily.     norethindrone 5 MG  tablet  Commonly known as:  AYGESTIN  Take 1 tablet (5 mg total) by mouth 2 (two) times daily.     oxyCODONE-acetaminophen 7.5-325 MG tablet  Commonly known as:  PERCOCET  Take 1 tablet by mouth every 4 (four) hours as needed for severe pain.           Follow-up Information    Follow up with Nekeisha Aure,MARIE-LYNE, MD In 3 weeks.   Specialty:  Obstetrics and Gynecology   Contact information:   [place] [id number] [phone]       Signed: Princess Bruins, MD 10/01/2015, 12:11 PM

## 2015-10-01 NOTE — Anesthesia Preprocedure Evaluation (Signed)
Anesthesia Evaluation  Patient identified by MRN, date of birth, ID band Patient awake    Reviewed: Allergy & Precautions, Patient's Chart, lab work & pertinent test results  Airway Mallampati: II  TM Distance: >3 FB Neck ROM: Full    Dental no notable dental hx. (+) Teeth Intact   Pulmonary pneumonia, resolved,    Pulmonary exam normal breath sounds clear to auscultation       Cardiovascular hypertension, Pt. on medications Normal cardiovascular exam Rhythm:Regular Rate:Normal     Neuro/Psych PSYCHIATRIC DISORDERS Anxiety Depression negative neurological ROS     GI/Hepatic negative GI ROS, Neg liver ROS,   Endo/Other  Obesity  Renal/GU negative Renal ROS  negative genitourinary   Musculoskeletal negative musculoskeletal ROS (+)   Abdominal (+) + obese,   Peds  Hematology negative hematology ROS (+)   Anesthesia Other Findings   Reproductive/Obstetrics Right adnexal cyst                             Anesthesia Physical Anesthesia Plan  ASA: II  Anesthesia Plan: General   Post-op Pain Management:    Induction: Intravenous  Airway Management Planned: Oral ETT  Additional Equipment:   Intra-op Plan:   Post-operative Plan: Extubation in OR  Informed Consent: I have reviewed the patients History and Physical, chart, labs and discussed the procedure including the risks, benefits and alternatives for the proposed anesthesia with the patient or authorized representative who has indicated his/her understanding and acceptance.   Dental advisory given  Plan Discussed with: CRNA, Anesthesiologist and Surgeon  Anesthesia Plan Comments:         Anesthesia Quick Evaluation

## 2015-10-01 NOTE — Anesthesia Postprocedure Evaluation (Signed)
Anesthesia Post Note  Patient: Stacy Hull  Procedure(s) Performed: Procedure(s) (LRB): ROBOTIC ASSISTED LAPAROSCOPIC BILATERAL SALPINGECTOMY WITH LYSIS OF ADHESIONS AND RIGHT ADNEXAL CYST (Right)  Anesthesia type: General  Patient location: PACU  Post pain: Pain level controlled  Post assessment: Post-op Vital signs reviewed  Last Vitals:  Filed Vitals:   10/01/15 1315  BP: 109/69  Pulse: 80  Temp:   Resp: 10    Post vital signs: Reviewed  Level of consciousness: sedated  Complications: No apparent anesthesia complications

## 2015-10-01 NOTE — Transfer of Care (Signed)
Immediate Anesthesia Transfer of Care Note  Patient: Stacy Hull  Procedure(s) Performed: Procedure(s) with comments: ROBOTIC ASSISTED LAPAROSCOPIC BILATERAL SALPINGECTOMY WITH LYSIS OF ADHESIONS AND RIGHT ADNEXAL CYST (Right) - POSSIBLE robot .  DO NOT DRAPE THE ROBOT.....she will use the laparoscopic camera to start.    Patient Location: PACU  Anesthesia Type:General  Level of Consciousness: awake, alert  and oriented  Airway & Oxygen Therapy: Patient Spontanous Breathing and Patient connected to nasal cannula oxygen  Post-op Assessment: Report given to RN and Post -op Vital signs reviewed and stable  Post vital signs: Reviewed and stable  Last Vitals:  Filed Vitals:   10/01/15 0851  Pulse: 0  Temp: 36.8 C  Resp: 20    Complications: No apparent anesthesia complications

## 2015-10-01 NOTE — Op Note (Signed)
10/01/2015  12:20 PM  PATIENT:  Stacy Hull  44 y.o. female  PRE-OPERATIVE DIAGNOSIS:  Persistent right pelvic pain with right adnexal cyst, H/O Bilateral Tubal Ligation  POST-OPERATIVE DIAGNOSIS:  Same and Abdominal and pelvic adhesions, Mild endometriosis, Right peri tubal cyst  PROCEDURE:  Procedure(s): ROBOTIC ASSISTED LAPAROSCOPIC BILATERAL SALPINGECTOMY WITH LYSIS OF ADHESIONS AND RIGHT ADNEXAL CYSTECTOMY, CAUTERIZATION OF ENDOMETRIOSIS  SURGEON:  Surgeon(s): Princess Bruins, MD  ASSISTANTS: Artelia Laroche   ANESTHESIA:   general   PROCEDURE:  Under general anesthesia with endotracheal intubation the patient is an lithotomy position. She is prepped with ChloraPrep on the abdomen and with Betadine and the suprapubic, vulvar and vaginal areas. She is draped as usual.  The Foley is inserted in the bladder. The vaginal exam reveals an anteverted uterus normal volume mobile, no excellent adnexal mass felt.  The weighted speculum is inserted in the vagina and the anterior lip of the cervix is grasped with a tenaculum.  Dilation of the cervix with Hegar dilators. The hysteromety is at 9 cm. We use a #8 Rumi and the small Ko-ring which are put in place easily. The other instruments are removed.  Patient had a supraumbilical hernia repair, we therefore do an infraumbilical incision with the scalpel after infiltrating Marcaine one quarter plain.  The aponeurosis is grasped with cokers and it is opened with Mayo scissors. The parietal peritoneum is opened bluntly with a finger.  A pursestring stitch of Vicryl 0 is done on the aponeurosis and the Sheryle Hail is inserted at that level. If pneumoperitoneum is created with CO2.  Inspection of the abdominopelvic cavities reveal adhesions around the hernia repair in the upper abdomen.  The liver is normal to inspection. The appendix appears retrocecal.  In the pelvis we noted a status post bilateral tubal sterilization. The uterus is normal with 2 normal  ovaries.  On the right side we note adhesions and endometriosis, as well as a cyst of about 3 cm which is just in between the right round ligament and the right tube.  Mild superficial lesions of endometriosis are still present in the posterior cul-de-sac.  Because of those findings, the decision is made to use the assistance of the robot.  We infiltrated the skin with Marcaine one quarter plane and make 2 small incisions on the left side and one small incision on the right side in the semicircular configuration. The ports are inserted under direct vision with 1 robotic ports on either side and the assistant port on the upper left. The robot was docked from the right side after placing the patient in deep Trendelenburg.  The Endo Shears scissor is inserted under direct vision in the right port and the PK on the left port.  We go to the console.  The right adnexal cyst is adherent and embedded between the right round ligament and right tube. With delicate dissection this cyst is completely excised and sent to pathology separately.  The cyst contained dark blood. We then proceed with bilateral salpingectomy making sure that both the distal and the proximal ends of the tubes are removed completely. The lesions of endometriosis are cauterized in the posterior cul-de-sac.  Hemostasis was completed with the PK.  The pelvic cavity was irrigated and suctioned.  Pictures were taken before and after the intervention.  All specimens were removed from the pelvis except for the right proximal portion of the tube which didn't fit through the assistant port.  The robot was undocked and we went to  laparoscopy time.  We irrigated and suctioned while the patient was removed from Trendelenburg.  We then applied a Surgicel at the level of the right adnexa where the cyst was removed.  We confirmed hemostasis once again.  Bupivacaine was infiltrated in the abdominopelvic cavities.  All the ports were removed under direct vision. The CO2  was evacuated. The infraumbilical incision was closed by attaching the pursestring stitch. And then a subcuticular suture of Vicryl 4-0 was used to close all incisions at the skin. Dermabond was added. A dressing was applied on the infraumbilical incision.  The Rumi and Ko-ring were removed from the vagina.  Hemostasis was adequate at that level as well.  The Foley was removed from the bladder.  The patient was brought to recovery room in good and stable status.  ESTIMATED BLOOD LOSS: 20 cc   Intake/Output Summary (Last 24 hours) at 10/01/15 1220 Last data filed at 10/01/15 1200  Gross per 24 hour  Intake   1500 ml  Output    460 ml  Net   1040 ml     BLOOD ADMINISTERED:none   LOCAL MEDICATIONS USED:  MARCAINE    and BUPIVICAINE   SPECIMEN:  Source of Specimen:  Bilateral tubes, Right Adnexal Cyst  DISPOSITION OF SPECIMEN:  PATHOLOGY  COUNTS:  YES  PLAN OF CARE: Transfer to PACU  Princess Bruins MD  10/01/2015

## 2015-10-01 NOTE — Discharge Instructions (Addendum)
Salpingectomy, Care After Refer to this sheet in the next few weeks. These instructions provide you with information on caring for yourself after your procedure. Your health care provider may also give you more specific instructions. Your treatment has been planned according to current medical practices, but problems sometimes occur. Call your health care provider if you have any problems or questions after your procedure. WHAT TO EXPECT AFTER THE PROCEDURE After your procedure, it is typical to have the following:  Abdominal pain that can be controlled with pain medicine.  Vaginal spotting.  Tiredness. HOME CARE INSTRUCTIONS  Get plenty of rest and sleep.  Only take over-the-counter or prescription medicines as directed by your health care provider.  Keep incision areas clean and dry. Remove or change any bandages (dressings) only as directed by your health care provider.  You may resume your regular diet. Eat a well-balanced diet.  Drink enough fluids to keep your urine clear or pale yellow.  Limit exercise and activities as directed by your health care provider. Do not lift anything heavier than 5 lb (2.3 kg) until your health care provider approves.  Do not drive until your health care provider approves.  Do not have sexual intercourse until your health care provider says it is okay.  Take your temperature twice a day for the first week. Write those temperatures down.  Follow up with your health care provider as directed. SEEK MEDICAL CARE IF:  You have pain when you urinate.  You see pus coming out of the incision, or the incision is separating.  You have increasing abdominal pain.  You have swelling or redness in the incision area.  You develop a rash.  You feel lightheaded.  You have pain that is not controlled with medicine. SEEK IMMEDIATE MEDICAL CARE IF:  You develop a fever.  You have increasing abdominal pain.  You develop chest or leg pain.  You  develop shortness of breath.  You pass out.   This information is not intended to replace advice given to you by your health care provider. Make sure you discuss any questions you have with your health care provider.   Document Released: 02/21/2011 Document Revised: 12/08/2014 Document Reviewed: 05/11/2013 Elsevier Interactive Patient Education 2016 [place] [facility] Care Instructions  Activity: Get plenty of rest for the remainder of the day. A responsible adult should stay with you for 24 hours following the procedure.  For the next 24 hours, DO NOT: -Drive a car -Paediatric nurse -Drink alcoholic beverages -Take any medication unless instructed by your physician -Make any legal decisions or sign important papers.  Meals: Start with liquid foods such as gelatin or soup. Progress to regular foods as tolerated. Avoid greasy, spicy, heavy foods. If nausea and/or vomiting occur, drink only clear liquids until the nausea and/or vomiting subsides. Call your physician if vomiting continues.  Special Instructions/Symptoms: Your throat may feel dry or sore from the anesthesia or the breathing tube placed in your throat during surgery. If this causes discomfort, gargle with warm salt water. The discomfort should disappear within 24 hours.  If you had a scopolamine patch placed behind your ear for the management of post- operative nausea and/or vomiting:  1. The medication in the patch is effective for 72 hours, after which it should be removed.  Wrap patch in a tissue and discard in the trash. Wash hands thoroughly with soap and water. 2. You may remove the patch earlier than 72 hours if you experience  unpleasant side effects which may include dry mouth, dizziness or visual disturbances. 3. Avoid touching the patch. Wash your hands with soap and water after contact with the patch.

## 2015-10-01 NOTE — H&P (Signed)
Stacy Hull is an 44 y.o. female G2P2002  RP:  Severe Rt pelvic pain/Rt Adnexal Cystic lesion for LPS Bilateral Salpingectomy, Rt Adnexal Removal of Cyst, Lysis of Adhesions, Possible Robotic assistance, Possible Laparotomy  Pertinent Gynecological History: Menses: Reg normal qmth Contraception: BT/S Blood transfusions: None Sexually transmitted diseases: Neg Previous GYN Procedures: BT/S, Novasure Endometrial Ablation Last mammogram: Neg Last pap: wnl  OB History: [id number]   Menstrual History:  Patient's last menstrual period was 09/09/2015.    Past Medical History  Diagnosis Date  . Hyperlipidemia   . HTN (hypertension)   . Anxiety   . Allergic rhinitis   . Depression   . Pneumonia     Past Surgical History  Procedure Laterality Date  . Cesarean section  2005  . Parathyroidectomy  1999    benign  . Cervical cone biopsy    . Tubal ligation    . Hysteroscopy with novasure  11/26/2012    Procedure: HYSTEROSCOPY WITH NOVASURE;  Surgeon: Marvene Staff, MD;  Location: [facility];  Service: Gynecology;  Laterality: N/A;  . Umbilical hernia repair    . Wisdom tooth extraction      Family History  Problem Relation Age of Onset  . Depression Mother   . Hypertension Father   . Aneurysm Father 91    cerebral  . Diabetes Maternal Grandmother   . Diabetes Maternal Grandfather   . Stroke Paternal Grandfather 41    Social History:  reports that she has never smoked. She has never used smokeless tobacco. She reports that she drinks about 2.0 oz of alcohol per week. She reports that she does not use illicit drugs.  Allergies:  Allergies  Allergen Reactions  . Erythromycin Other (See Comments)    G I upset.    Prescriptions prior to admission  Medication Sig Dispense Refill Last Dose  . fexofenadine (ALLEGRA) 180 MG tablet Take 180 mg by mouth daily as needed for allergies or rhinitis.     Marland Kitchen albuterol (PROVENTIL HFA;VENTOLIN HFA) 108 (90 BASE) MCG/ACT inhaler  Inhale 1-2 puffs into the lungs every 4 (four) hours as needed for wheezing or shortness of breath. 1 Inhaler 0 rescue  . ALPRAZolam (XANAX) 0.5 MG tablet TAKE 1 TABLET BY MOUTH AT BEDTIME AS NEEDED FOR ANXIETY AND SLEEP 30 tablet 2 prn at Unknown time  . amoxicillin-clavulanate (AUGMENTIN) 500-125 MG tablet Take 1 tablet by mouth 3 (three) times daily. 1 every 8 hours.   09/20/2015 at Unknown time  . citalopram (CELEXA) 20 MG tablet TAKE 1 TABLET BY MOUTH DAILY 90 tablet 3 09/21/2015 at Unknown time  . diphenhydrAMINE (BENADRYL) 25 MG tablet Take 25 mg by mouth every 6 (six) hours as needed for allergies.   prn  . ibuprofen (ADVIL,MOTRIN) 800 MG tablet Take 800 mg by mouth every 8 (eight) hours as needed for moderate pain.    09/22/2015 at Unknown time  . lisinopril-hydrochlorothiazide (PRINZIDE,ZESTORETIC) 20-12.5 MG per tablet Take 1 tablet by mouth daily. 90 tablet 3 09/21/2015 at Unknown time  . Multiple Vitamin (MULTIVITAMIN) tablet Take 1 tablet by mouth daily.   09/21/2015 at Unknown time  . norethindrone (AYGESTIN) 5 MG tablet Take 1 tablet (5 mg total) by mouth 2 (two) times daily. (Patient taking differently: Take 5 mg by mouth daily. ) 30 tablet 1   . oxyCODONE-acetaminophen (PERCOCET/ROXICET) 5-325 MG tablet Take 2 tablets by mouth every 4 (four) hours as needed for severe pain. (Patient taking differently: Take 1 tablet by mouth every  6 (six) hours as needed for severe pain. ) 6 tablet 0 09/22/2015 at 0600    ROS  Neg  Last menstrual period 09/09/2015.   Physical Exam   VSS  Pelvic US:  Rt Adnexal Cystic lesion 3.6 cm.  Hydrosalpinx?  Ovaries wnl.  No FF.  UPT neg  Assessment/Plan: Rt persistant pelvic pain with Rt Adnexal Cystic lesion for LPS Bilat. Salpingectomy, Rt Adnexal Cystic lesion, Lysis of Adhesions, possible Robotic assistance, possible Laparotomy.  Surgery and risks reviewed.  Mattheus Rauls,MARIE-LYNE 10/01/2015, 8:49 AM

## 2015-10-02 ENCOUNTER — Encounter (HOSPITAL_COMMUNITY): Payer: Self-pay | Admitting: Obstetrics & Gynecology

## 2015-12-01 ENCOUNTER — Other Ambulatory Visit: Payer: Self-pay | Admitting: Family Medicine

## 2015-12-03 NOTE — Telephone Encounter (Signed)
Meds ordered this encounter  Medications  . ALPRAZolam (XANAX) 0.5 MG tablet    Sig: TAKE 1 TABLET BY MOUTH EVERY NIGHT AT BEDTIME FOR ANXIETY/ SLEEP    Dispense:  30 tablet    Refill:  0   Please advise patient that she needs a visit with Dr. Brigitte Pulse.

## 2015-12-04 NOTE — Telephone Encounter (Signed)
Notified pt of RF and need for f/up. Set up appt for 1/19.

## 2015-12-14 ENCOUNTER — Other Ambulatory Visit: Payer: Self-pay | Admitting: Family Medicine

## 2015-12-20 ENCOUNTER — Ambulatory Visit (INDEPENDENT_AMBULATORY_CARE_PROVIDER_SITE_OTHER): Payer: BC Managed Care – PPO | Admitting: Family Medicine

## 2015-12-20 VITALS — BP 150/92 | HR 76 | Temp 98.1°F | Resp 16 | Ht 67.0 in | Wt 193.0 lb

## 2015-12-20 DIAGNOSIS — E785 Hyperlipidemia, unspecified: Secondary | ICD-10-CM

## 2015-12-20 DIAGNOSIS — F411 Generalized anxiety disorder: Secondary | ICD-10-CM | POA: Diagnosis not present

## 2015-12-20 DIAGNOSIS — F32A Depression, unspecified: Secondary | ICD-10-CM

## 2015-12-20 DIAGNOSIS — F329 Major depressive disorder, single episode, unspecified: Secondary | ICD-10-CM

## 2015-12-20 DIAGNOSIS — I10 Essential (primary) hypertension: Secondary | ICD-10-CM

## 2015-12-20 MED ORDER — LISINOPRIL-HYDROCHLOROTHIAZIDE 20-25 MG PO TABS: 1.0000 | ORAL_TABLET | Freq: Every day | ORAL | Status: DC

## 2015-12-20 MED ORDER — CITALOPRAM HYDROBROMIDE 20 MG PO TABS: 20.0000 mg | ORAL_TABLET | Freq: Every day | ORAL | Status: DC

## 2015-12-20 MED ORDER — ALPRAZOLAM 0.5 MG PO TABS: ORAL_TABLET | ORAL | Status: DC

## 2015-12-20 NOTE — Patient Instructions (Signed)

## 2015-12-20 NOTE — Progress Notes (Signed)
Subjective:    Patient ID: Stacy Hull, female    DOB: [birth year], 45 y.o.   MRN: [id number] Chief Complaint  Patient presents with  . Medication Refill  . Hypertension     HPI  Had an enlarged follopian tube.  Didn't taker out her uterus which she wanted as ablation didn't help.  Her menstrual cycle twice since then as been horrible.  Hemorrhoids, anal fissures  - has the nitro or dilt cream at home ot use but still flaires freq.  Couldn't tolerate stool softeners.  miralax does well.  Dr. Collene Mares  Takes BP occ at home. 135-140/80  Past Medical History  Diagnosis Date  . Hyperlipidemia   . HTN (hypertension)   . Anxiety   . Allergic rhinitis   . Depression   . Pneumonia    Past Surgical History  Procedure Laterality Date  . Cesarean section  2005  . Parathyroidectomy  1999    benign  . Cervical cone biopsy    . Tubal ligation    . Hysteroscopy with novasure  11/26/2012    Procedure: HYSTEROSCOPY WITH NOVASURE;  Surgeon: Marvene Staff, MD;  Location: [facility];  Service: Gynecology;  Laterality: N/A;  . Umbilical hernia repair    . Wisdom tooth extraction    . Robotic assisted laparoscopic ovarian cystectomy Right 10/01/2015    Procedure: ROBOTIC ASSISTED LAPAROSCOPIC BILATERAL SALPINGECTOMY WITH LYSIS OF ADHESIONS AND RIGHT ADNEXAL CYST;  Surgeon: Princess Bruins, MD;  Location: [facility];  Service: Gynecology;  Laterality: Right;  POSSIBLE robot .  DO NOT DRAPE THE ROBOT.....she will use the laparoscopic camera to start.     Current Outpatient Prescriptions on File Prior to Visit  Medication Sig Dispense Refill  . albuterol (PROVENTIL HFA;VENTOLIN HFA) 108 (90 BASE) MCG/ACT inhaler Inhale 1-2 puffs into the lungs every 4 (four) hours as needed for wheezing or shortness of breath. 1 Inhaler 0  . Multiple Vitamin (MULTIVITAMIN) tablet Take 1 tablet by mouth daily.     No current facility-administered medications on file prior to visit.   Allergies  Allergen  Reactions  . Erythromycin Other (See Comments)    G I upset.   Family History  Problem Relation Age of Onset  . Depression Mother   . Hypertension Father   . Aneurysm Father 66    cerebral  . Diabetes Maternal Grandmother   . Diabetes Maternal Grandfather   . Stroke Paternal Grandfather 76   Social History   Social History  . Marital Status: Married    Spouse Name: Rhyan Lope  . Number of Children: 2  . Years of Education: Master's +   Occupational History  . [employer]   Social History Main Topics  . Smoking status: Never Smoker   . Smokeless tobacco: Never Used  . Alcohol Use: 2.0 oz/week    4 Standard drinks or equivalent per week     Comment: occasionally  . Drug Use: No  . Sexual Activity:    Partners: Male   Other Topics Concern  . Not on file   Social History Narrative   Lives with her husband and their 2 children. Walks 3x's weekly for exercise.   Depression screen [facility] 2/9 12/20/2015 09/10/2015 11/03/2014  Decreased Interest 0 0 0  Down, Depressed, Hopeless 1 0 0  PHQ - 2 Score 1 0 0      Review of Systems  Constitutional: Positive for  activity change and fatigue. Negative for fever, chills, diaphoresis and appetite change.  Eyes: Negative for visual disturbance.  Respiratory: Negative for cough and shortness of breath.   Cardiovascular: Negative for chest pain, palpitations and leg swelling.  Gastrointestinal: Positive for abdominal pain, anal bleeding and rectal pain. Negative for nausea, diarrhea, constipation and blood in stool.  Genitourinary: Positive for vaginal bleeding, menstrual problem and pelvic pain. Negative for hematuria, decreased urine volume and vaginal discharge.  Neurological: Negative for syncope and headaches.  Hematological: Does not bruise/bleed easily.  Psychiatric/Behavioral: Positive for sleep disturbance and dysphoric mood. The patient is not  nervous/anxious.        Objective:  BP 150/92 mmHg  Pulse 76  Temp(Src) 98.1 F (36.7 C)  Resp 16  Ht 5\' 7"  (1.702 m)  Wt 193 lb (87.544 kg)  BMI 30.22 kg/m2  Physical Exam  Constitutional: She is oriented to person, place, and time. She appears well-developed and well-nourished. No distress.  HENT:  Head: Normocephalic and atraumatic.  Right Ear: External ear normal.  Left Ear: External ear normal.  Eyes: Conjunctivae are normal. No scleral icterus.  Neck: Normal range of motion. Neck supple. No thyromegaly present.  Cardiovascular: Normal rate, regular rhythm, normal heart sounds and intact distal pulses.   Pulmonary/Chest: Effort normal and breath sounds normal. No respiratory distress.  Musculoskeletal: She exhibits no edema.  Lymphadenopathy:    She has no cervical adenopathy.  Neurological: She is alert and oriented to person, place, and time.  Skin: Skin is warm and dry. She is not diaphoretic. No erythema.  Psychiatric: She has a normal mood and affect. Her behavior is normal.          Assessment & Plan:    Cbc nml 2 mos prior, cmp nml other thancalcium a hair low at 8.8 (nml 8.9-10.3) so check vt D w/ next labs  1. Essential hypertension - increase lisinopril-hctz from 20-12.5 to 20-25, cont checking bp at home  2. Hyperlipidemia   3. Anxiety state - cont alprazolam 0.5 - using true prn  4. Depression - cont citalopram 20   H/o hematuria - on menses today, recheck ua at next OV.  Meds ordered this encounter  Medications  . lisinopril-hydrochlorothiazide (PRINZIDE,ZESTORETIC) 20-25 MG tablet    Sig: Take 1 tablet by mouth daily.    Dispense:  90 tablet    Refill:  3  . citalopram (CELEXA) 20 MG tablet    Sig: Take 1 tablet (20 mg total) by mouth daily.    Dispense:  90 tablet    Refill:  4  . ALPRAZolam (XANAX) 0.5 MG tablet    Sig: TAKE 1 TABLET BY MOUTH EVERY NIGHT AT BEDTIME FOR ANXIETY/ SLEEP    Dispense:  30 tablet    Refill:  5    [name],  MD MPH

## 2015-12-25 ENCOUNTER — Encounter: Payer: Self-pay | Admitting: Family Medicine

## 2016-03-20 ENCOUNTER — Ambulatory Visit (INDEPENDENT_AMBULATORY_CARE_PROVIDER_SITE_OTHER): Payer: BC Managed Care – PPO | Admitting: Physician Assistant

## 2016-03-20 VITALS — BP 126/88 | HR 85 | Temp 97.3°F | Resp 16 | Ht 67.0 in | Wt 193.6 lb

## 2016-03-20 DIAGNOSIS — J209 Acute bronchitis, unspecified: Secondary | ICD-10-CM

## 2016-03-20 MED ORDER — AMOXICILLIN-POT CLAVULANATE 875-125 MG PO TABS: 1.0000 | ORAL_TABLET | Freq: Two times a day (BID) | ORAL | Status: DC

## 2016-03-20 MED ORDER — BENZONATATE 100 MG PO CAPS: 100.0000 mg | ORAL_CAPSULE | Freq: Three times a day (TID) | ORAL | Status: DC | PRN

## 2016-03-20 MED ORDER — HYDROCOD POLST-CPM POLST ER 10-8 MG/5ML PO SUER: 5.0000 mL | Freq: Two times a day (BID) | ORAL | Status: DC | PRN

## 2016-03-20 MED ORDER — AMOXICILLIN-POT CLAVULANATE 875-125 MG PO TABS: 1.0000 | ORAL_TABLET | Freq: Two times a day (BID) | ORAL | Status: AC

## 2016-03-20 NOTE — Patient Instructions (Addendum)
Drink plenty of water (64 oz/day) and get plenty of rest. If you have been prescribed a cough syrup, do not drive or operate heavy machinery while using this medication. May take tessalon during the day for cough. Continue daily zyrtec. Albuterol as needed. In 3 days if you are not turning the corner, may fill antibiotic. If your symptoms are not improving in 1 week, return to clinic.     IF you received an x-ray today, you will receive an invoice from [facility]. Please contact [facility] at [phone] with questions or concerns regarding your invoice.   IF you received labwork today, you will receive an invoice from [employer]. Please contact [facility] at [phone] with questions or concerns regarding your invoice.   Our billing staff will not be able to assist you with questions regarding bills from these companies.  You will be contacted with the lab results as soon as they are available. The fastest way to get your results is to activate your My Chart account. Instructions are located on the last page of this paperwork. If you have not heard from [place] regarding the results in 2 weeks, please contact this office.

## 2016-03-20 NOTE — Progress Notes (Signed)
[facility] [address], [place] [address] [phone]  Date:  03/20/2016   Name:  Stacy Hull   DOB:  [birth year]   MRN:  [id number]  PCP:  [name], MD    Chief Complaint: Cough and sinus pressure   History of Present Illness:  This is a 45 y.o. female with PMH HTN, HLD, anxiety, depression who is presenting with 1 week of sinus pressure and cough. Cough is productive. Throat is "scratchy". States chest is starting to burn with coughing. Denies wheezing, sob, fever, chills. Feels her symptoms are actually worsening.  Aggravating/alleviating factors: generic zyrtec, mucinex. History of asthma: yes, has an albuterol inhaler but has not had to use. History of env allergies: yes, takes zyrtec. 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  Review of Systems:  Review of Systems See HPI  Patient Active Problem List   Diagnosis Date Noted  . Anxiety state 09/27/2007  . Hyperlipidemia 03/18/2007  . Depression 03/18/2007  . Essential hypertension 03/18/2007    Prior to Admission medications   Medication Sig Start Date End Date Taking? Authorizing Provider  albuterol (PROVENTIL HFA;VENTOLIN HFA) 108 (90 BASE) MCG/ACT inhaler Inhale 1-2 puffs into the lungs every 4 (four) hours as needed for wheezing or shortness of breath. 09/05/14  Yes Wendie Agreste, MD  ALPRAZolam Duanne Moron) 0.5 MG tablet TAKE 1 TABLET BY MOUTH EVERY NIGHT AT BEDTIME FOR ANXIETY/ SLEEP 12/20/15  Yes Shawnee Knapp, MD  citalopram (CELEXA) 20 MG tablet Take 1 tablet (20 mg total) by mouth daily. 12/20/15  Yes Shawnee Knapp, MD  lisinopril-hydrochlorothiazide (PRINZIDE,ZESTORETIC) 20-25 MG tablet Take 1 tablet by mouth daily. 12/20/15  Yes Shawnee Knapp, MD  Multiple Vitamin (MULTIVITAMIN) tablet Take 1 tablet by mouth daily.   Yes Historical Provider, MD    Allergies  Allergen Reactions  . Erythromycin Other (See Comments)    G I upset.    Past Surgical History  Procedure  Laterality Date  . Cesarean section  2005  . Parathyroidectomy  1999    benign  . Cervical cone biopsy    . Tubal ligation    . Hysteroscopy with novasure  11/26/2012    Procedure: HYSTEROSCOPY WITH NOVASURE;  Surgeon: Marvene Staff, MD;  Location: [facility];  Service: Gynecology;  Laterality: N/A;  . Umbilical hernia repair    . Wisdom tooth extraction    . Robotic assisted laparoscopic ovarian cystectomy Right 10/01/2015    Procedure: ROBOTIC ASSISTED LAPAROSCOPIC BILATERAL SALPINGECTOMY WITH LYSIS OF ADHESIONS AND RIGHT ADNEXAL CYST;  Surgeon: Princess Bruins, MD;  Location: [facility];  Service: Gynecology;  Laterality: Right;  POSSIBLE robot .  DO NOT DRAPE THE ROBOT.....she will use the laparoscopic camera to start.      Social History  Substance Use Topics  . Smoking status: Never Smoker   . Smokeless tobacco: Never Used  . Alcohol Use: 2.0 oz/week    4 Standard drinks or equivalent per week     Comment: occasionally    Family History  Problem Relation Age of Onset  . Depression Mother   . Hypertension Father   . Aneurysm Father 60    cerebral  . Diabetes Maternal Grandmother   . Diabetes Maternal Grandfather   . Stroke Paternal Grandfather 22    Medication list has been reviewed and updated.  Physical Examination:  Physical Exam  Constitutional: She is oriented to person, place, and time. She  appears well-developed and well-nourished. No distress.  HENT:  Head: Normocephalic and atraumatic.  Right Ear: Hearing, tympanic membrane, external ear and ear canal normal.  Left Ear: Hearing, tympanic membrane, external ear and ear canal normal.  Nose: Right sinus exhibits maxillary sinus tenderness (mild). Right sinus exhibits no frontal sinus tenderness. Left sinus exhibits maxillary sinus tenderness (mild). Left sinus exhibits no frontal sinus tenderness.  Mouth/Throat: Uvula is midline and mucous membranes are normal. Posterior oropharyngeal erythema present. No  oropharyngeal exudate or posterior oropharyngeal edema.  Eyes: Conjunctivae and lids are normal. Right eye exhibits no discharge. Left eye exhibits no discharge. No scleral icterus.  Cardiovascular: Normal rate, regular rhythm, normal heart sounds and normal pulses.   No murmur heard. Pulmonary/Chest: Effort normal and breath sounds normal. No respiratory distress. She has no wheezes. She has no rhonchi. She has no rales.  Musculoskeletal: Normal range of motion.  Lymphadenopathy:       Head (right side): No submental, no submandibular and no tonsillar adenopathy present.       Head (left side): No submental, no submandibular and no tonsillar adenopathy present.    She has no cervical adenopathy.  Neurological: She is alert and oriented to person, place, and time.  Skin: Skin is warm, dry and intact. No lesion and no rash noted.  Psychiatric: She has a normal mood and affect. Her speech is normal and behavior is normal. Thought content normal.   BP 126/88 mmHg  Pulse 85  Temp(Src) 97.3 F (36.3 C) (Oral)  Resp 16  Ht 5' 7"  (1.702 m)  Wt 193 lb 9.6 oz (87.816 kg)  BMI 30.31 kg/m2  SpO2 98%  LMP 03/09/2016  Assessment and Plan:  1. Acute bronchitis, unspecified organism Suspect viral etiology. Treat supportively with tussionex and tessalon. Continue zyrtec. If not getting better in 4 days, may fill the augmentin. Return in 7-10 days if symptoms do not improve or at any time if symptoms worsen.  - benzonatate (TESSALON) 100 MG capsule; Take 1-2 capsules (100-200 mg total) by mouth 3 (three) times daily as needed for cough.  Dispense: 40 capsule; Refill: 0 - chlorpheniramine-HYDROcodone (TUSSIONEX PENNKINETIC ER) 10-8 MG/5ML SUER; Take 5 mLs by mouth every 12 (twelve) hours as needed for cough.  Dispense: 100 mL; Refill: 0 - amoxicillin-clavulanate (AUGMENTIN) 875-125 MG tablet; Take 1 tablet by mouth 2 (two) times daily.  Dispense: 20 tablet; Refill: 0   Benjaman Pott. Drenda Freeze,  MHS [facility]  03/20/2016

## 2016-04-23 ENCOUNTER — Ambulatory Visit (INDEPENDENT_AMBULATORY_CARE_PROVIDER_SITE_OTHER): Payer: BC Managed Care – PPO | Admitting: Family Medicine

## 2016-04-23 VITALS — BP 136/84 | HR 99 | Temp 97.8°F | Resp 18 | Ht 67.0 in | Wt 191.2 lb

## 2016-04-23 DIAGNOSIS — J019 Acute sinusitis, unspecified: Secondary | ICD-10-CM | POA: Diagnosis not present

## 2016-04-23 DIAGNOSIS — E559 Vitamin D deficiency, unspecified: Secondary | ICD-10-CM

## 2016-04-23 DIAGNOSIS — Z5181 Encounter for therapeutic drug level monitoring: Secondary | ICD-10-CM

## 2016-04-23 DIAGNOSIS — J209 Acute bronchitis, unspecified: Secondary | ICD-10-CM | POA: Diagnosis not present

## 2016-04-23 DIAGNOSIS — R5383 Other fatigue: Secondary | ICD-10-CM | POA: Diagnosis not present

## 2016-04-23 LAB — COMPREHENSIVE METABOLIC PANEL
ALK PHOS: 55 U/L (ref 33–115)
ALT: 15 U/L (ref 6–29)
AST: 18 U/L (ref 10–30)
Albumin: 4.3 g/dL (ref 3.6–5.1)
BILIRUBIN TOTAL: 0.4 mg/dL (ref 0.2–1.2)
BUN: 9 mg/dL (ref 7–25)
CO2: 24 mmol/L (ref 20–31)
CREATININE: 0.51 mg/dL (ref 0.50–1.10)
Calcium: 9.7 mg/dL (ref 8.6–10.2)
Chloride: 100 mmol/L (ref 98–110)
GLUCOSE: 102 mg/dL — AB (ref 65–99)
POTASSIUM: 4.1 mmol/L (ref 3.5–5.3)
SODIUM: 137 mmol/L (ref 135–146)
TOTAL PROTEIN: 7.4 g/dL (ref 6.1–8.1)

## 2016-04-23 LAB — POCT CBC
GRANULOCYTE PERCENT: 63.6 % (ref 37–80)
HEMATOCRIT: 42.5 % (ref 37.7–47.9)
HEMOGLOBIN: 15.3 g/dL (ref 12.2–16.2)
Lymph, poc: 3 (ref 0.6–3.4)
MCH, POC: 33.2 pg — AB (ref 27–31.2)
MCHC: 36.1 g/dL — AB (ref 31.8–35.4)
MCV: 92 fL (ref 80–97)
MID (cbc): 0.5 (ref 0–0.9)
MPV: 6.6 fL (ref 0–99.8)
POC GRANULOCYTE: 6.2 (ref 2–6.9)
POC LYMPH PERCENT: 30.8 %L (ref 10–50)
POC MID %: 5.6 % (ref 0–12)
Platelet Count, POC: 303 10*3/uL (ref 142–424)
RBC: 4.62 M/uL (ref 4.04–5.48)
RDW, POC: 12.6 %
WBC: 9.8 10*3/uL (ref 4.6–10.2)

## 2016-04-23 LAB — TSH: TSH: 1.11 mIU/L

## 2016-04-23 MED ORDER — PREDNISONE 20 MG PO TABS: ORAL_TABLET | ORAL | Status: DC

## 2016-04-23 MED ORDER — LEVOFLOXACIN 750 MG PO TABS: 750.0000 mg | ORAL_TABLET | Freq: Every day | ORAL | Status: DC

## 2016-04-23 MED ORDER — HYDROCOD POLST-CPM POLST ER 10-8 MG/5ML PO SUER: 5.0000 mL | Freq: Two times a day (BID) | ORAL | Status: DC | PRN

## 2016-04-23 MED ORDER — BENZONATATE 200 MG PO CAPS: 200.0000 mg | ORAL_CAPSULE | Freq: Three times a day (TID) | ORAL | Status: DC | PRN

## 2016-04-23 NOTE — Progress Notes (Signed)
Subjective:  By signing my name below, I, Stacy Hull, attest that this documentation has been prepared under the direction and in the presence of Stacy Cheadle, MD.  Electronically Signed: Thea Hull, ED Scribe. 04/23/2016. 11:19 AM.  Patient ID: Stacy Hull, female    DOB: 1971/11/06, 45 y.o.   MRN: [id number]  HPI   Chief Complaint  Patient presents with   Follow-up    cough    Sore Throat   Fatigue   HPI Comments: Stacy Hull is a 45 y.o. female who presents to the [facility]. Pt was seen 1 month prior for bronchitis. She has hx of asthma and seasonal allergies. She is a Pharmacist, hospital. Suspect viral ideology from reassuring exam so pt was treated with  tessalon and tussionex. Instructed to fill Augmentin bid if no improvement in 4 days. Pt last had lab work done during bilateral salpingectomy for endometriosis  l and had an hemorraghic ovarian cyst 6 months prior. Thyroids last check 3 years prior. Weight is stable.  Pt states she felt better after finishing Augmentin but states cough has persisted. She reports associated sore throat, sinus pressures, ear pressure, hoarseness and decreased appetite. She also reports emesis due to violent cough. She reports trouble sleeping last night due to symptoms. She is out of her inhaler and tessalon. Pt takes generic zyrtec for allergies.   Pt started BP medication about 10 months ago.   Patient Active Problem List   Diagnosis Date Noted   Anxiety state 09/27/2007   Hyperlipidemia 03/18/2007   Depression 03/18/2007   Essential hypertension 03/18/2007   Past Medical History  Diagnosis Date   Hyperlipidemia    HTN (hypertension)    Anxiety    Allergic rhinitis    Depression    Pneumonia    Past Surgical History  Procedure Laterality Date   Cesarean section  2005   Parathyroidectomy  1999    benign   Cervical cone biopsy     Tubal ligation     Hysteroscopy with novasure   11/26/2012    Procedure: HYSTEROSCOPY WITH NOVASURE;  Surgeon: Marvene Staff, MD;  Location: [facility];  Service: Gynecology;  Laterality: N/A;   Umbilical hernia repair     Wisdom tooth extraction     Robotic assisted laparoscopic ovarian cystectomy Right 10/01/2015    Procedure: ROBOTIC ASSISTED LAPAROSCOPIC BILATERAL SALPINGECTOMY WITH LYSIS OF ADHESIONS AND RIGHT ADNEXAL CYST;  Surgeon: Princess Bruins, MD;  Location: [facility];  Service: Gynecology;  Laterality: Right;  POSSIBLE robot .  DO NOT DRAPE THE ROBOT.....she will use the laparoscopic camera to start.     Allergies  Allergen Reactions   Erythromycin Other (See Comments)    G I upset.   Prior to Admission medications   Medication Sig Start Date End Date Taking? Authorizing Provider  albuterol (PROVENTIL HFA;VENTOLIN HFA) 108 (90 BASE) MCG/ACT inhaler Inhale 1-2 puffs into the lungs every 4 (four) hours as needed for wheezing or shortness of breath. 09/05/14  Yes Stacy Agreste, MD  ALPRAZolam Stacy Hull) 0.5 MG tablet TAKE 1 TABLET BY MOUTH EVERY NIGHT AT BEDTIME FOR ANXIETY/ SLEEP 12/20/15  Yes Stacy Knapp, MD  citalopram (CELEXA) 20 MG tablet Take 1 tablet (20 mg total) by mouth daily. 12/20/15  Yes Stacy Knapp, MD  lisinopril-hydrochlorothiazide (PRINZIDE,ZESTORETIC) 20-25 MG tablet Take 1 tablet by mouth daily. 12/20/15  Yes Stacy Knapp, MD  Multiple Vitamin (MULTIVITAMIN) tablet Take 1 tablet by  mouth daily.   Yes Historical Provider, MD  benzonatate (TESSALON) 100 MG capsule Take 1-2 capsules (100-200 mg total) by mouth 3 (three) times daily as needed for cough. Patient not taking: Reported on 04/23/2016 03/20/16   Stacy Slocumb, PA-C  chlorpheniramine-HYDROcodone [facility] ER) 10-8 MG/5ML SUER Take 5 mLs by mouth every 12 (twelve) hours as needed for cough. Patient not taking: Reported on 04/23/2016 03/20/16   Stacy Slocumb, PA-C    Review of Systems  Constitutional: Positive for appetite change and fatigue.  Negative for fever and chills.  HENT: Positive for congestion, ear pain, sinus pressure, sore throat and voice change.   Respiratory: Positive for cough.   Gastrointestinal: Positive for vomiting ( due to cough). Negative for nausea.  Psychiatric/Behavioral: Positive for sleep disturbance.     Objective:   Physical Exam  Constitutional: She is oriented to person, place, and time. She appears well-developed and well-nourished. No distress.  HENT:  Head: Normocephalic and atraumatic.  Right Ear: Tympanic membrane is scarred. A middle ear effusion is present.  Left Ear: Tympanic membrane is scarred, erythematous and retracted. A middle ear effusion is present.  Nose: Nose normal.  Mouth/Throat: Uvula is midline, oropharynx is clear and moist and mucous membranes are normal. No oropharyngeal exudate.  Eyes: Conjunctivae and EOM are normal.  Neck: Neck supple.  Cardiovascular: Normal rate.   Pulmonary/Chest: Effort normal.  Musculoskeletal: Normal range of motion.  Neurological: She is alert and oriented to person, place, and time.  Skin: Skin is warm and dry.  Psychiatric: She has a normal mood and affect. Her behavior is normal.  Nursing note and vitals reviewed.   Filed Vitals:   04/23/16 1111  BP: 136/84  Pulse: 99  Temp: 97.8 F (36.6 C)  TempSrc: Oral  Resp: 18  Height: 5' 7"  (1.702 m)  Weight: 191 lb 3.2 oz (86.728 kg)  SpO2: 97%    Assessment & Plan:   1. Acute sinusitis, recurrence not specified, unspecified location   2. Other fatigue   3. Medication monitoring encounter   4. Hypocalcemia   5. Acute bronchitis, unspecified organism     Orders Placed This Encounter  Procedures   Comprehensive metabolic panel   TSH   VITAMIN D 25 Hydroxy (Vit-D Deficiency, Fractures)   Care order/instruction    Compose work note per patient specification/AVS/Go    Scheduling Instructions:     Compose work note per patient specifications/AVS/Go   POCT CBC    Meds  ordered this encounter  Medications   benzonatate (TESSALON) 200 MG capsule    Sig: Take 1 capsule (200 mg total) by mouth 3 (three) times daily as needed for cough.    Dispense:  60 capsule    Refill:  0   chlorpheniramine-HYDROcodone (TUSSIONEX PENNKINETIC ER) 10-8 MG/5ML SUER    Sig: Take 5 mLs by mouth every 12 (twelve) hours as needed for cough.    Dispense:  140 mL    Refill:  0   levofloxacin (LEVAQUIN) 750 MG tablet    Sig: Take 1 tablet (750 mg total) by mouth daily.    Dispense:  7 tablet    Refill:  0   predniSONE (DELTASONE) 20 MG tablet    Sig: Take 3 tabs po qd x 3d, 2 tabs po qd x 3d, 1 tab po qd x 3d    Dispense:  18 tablet    Refill:  0    I personally performed the services described in this  documentation, which was scribed in my presence. The recorded information has been reviewed and considered, and addended by me as needed.  Stacy Cheadle, MD MPH

## 2016-04-23 NOTE — Patient Instructions (Addendum)
Meds ordered this encounter  Medications  . benzonatate (TESSALON) 200 MG capsule    Sig: Take 1 capsule (200 mg total) by mouth 3 (three) times daily as needed for cough.    Dispense:  60 capsule    Refill:  0  . chlorpheniramine-HYDROcodone (TUSSIONEX PENNKINETIC ER) 10-8 MG/5ML SUER    Sig: Take 5 mLs by mouth every 12 (twelve) hours as needed for cough.    Dispense:  140 mL    Refill:  0  . levofloxacin (LEVAQUIN) 750 MG tablet    Sig: Take 1 tablet (750 mg total) by mouth daily.    Dispense:  7 tablet    Refill:  0  . predniSONE (DELTASONE) 20 MG tablet    Sig: Take 3 tabs po qd x 3d, 2 tabs po qd x 3d, 1 tab po qd x 3d    Dispense:  18 tablet    Refill:  0    IF you received an x-ray today, you will receive an invoice from [facility]. Please contact [facility] at [phone] with questions or concerns regarding your invoice.   IF you received labwork today, you will receive an invoice from [employer]. Please contact [facility] at [phone] with questions or concerns regarding your invoice.   Our billing staff will not be able to assist you with questions regarding bills from these companies.  You will be contacted with the lab results as soon as they are available. The fastest way to get your results is to activate your My Chart account. Instructions are located on the last page of this paperwork. If you have not heard from [place] regarding the results in 2 weeks, please contact this office.    Sinusitis, Adult Sinusitis is redness, soreness, and inflammation of the paranasal sinuses. Paranasal sinuses are air pockets within the bones of your face. They are located beneath your eyes, in the middle of your forehead, and above your eyes. In healthy paranasal sinuses, mucus is able to drain out, and air is able to circulate through them by way of your nose. However, when your paranasal sinuses are inflamed, mucus and air can become  trapped. This can allow bacteria and other germs to grow and cause infection. Sinusitis can develop quickly and last only a short time (acute) or continue over a long period (chronic). Sinusitis that lasts for more than 12 weeks is considered chronic. CAUSES Causes of sinusitis include:  Allergies.  Structural abnormalities, such as displacement of the cartilage that separates your nostrils (deviated septum), which can decrease the air flow through your nose and sinuses and affect sinus drainage.  Functional abnormalities, such as when the small hairs (cilia) that line your sinuses and help remove mucus do not work properly or are not present. SIGNS AND SYMPTOMS Symptoms of acute and chronic sinusitis are the same. The primary symptoms are pain and pressure around the affected sinuses. Other symptoms include:  Upper toothache.  Earache.  Headache.  Bad breath.  Decreased sense of smell and taste.  A cough, which worsens when you are lying flat.  Fatigue.  Fever.  Thick drainage from your nose, which often is green and may contain pus (purulent).  Swelling and warmth over the affected sinuses. DIAGNOSIS Your health care provider will perform a physical exam. During your exam, your health care provider may perform any of the following to help determine if you have acute sinusitis or chronic sinusitis:  Look in your nose for signs of  abnormal growths in your nostrils (nasal polyps).  Tap over the affected sinus to check for signs of infection.  View the inside of your sinuses using an imaging device that has a light attached (endoscope). If your health care provider suspects that you have chronic sinusitis, one or more of the following tests may be recommended:  Allergy tests.  Nasal culture. A sample of mucus is taken from your nose, sent to a lab, and screened for bacteria.  Nasal cytology. A sample of mucus is taken from your nose and examined by your health care  provider to determine if your sinusitis is related to an allergy. TREATMENT Most cases of acute sinusitis are related to a viral infection and will resolve on their own within 10 days. Sometimes, medicines are prescribed to help relieve symptoms of both acute and chronic sinusitis. These may include pain medicines, decongestants, nasal steroid sprays, or saline sprays. However, for sinusitis related to a bacterial infection, your health care provider will prescribe antibiotic medicines. These are medicines that will help kill the bacteria causing the infection. Rarely, sinusitis is caused by a fungal infection. In these cases, your health care provider will prescribe antifungal medicine. For some cases of chronic sinusitis, surgery is needed. Generally, these are cases in which sinusitis recurs more than 3 times per year, despite other treatments. HOME CARE INSTRUCTIONS  Drink plenty of water. Water helps thin the mucus so your sinuses can drain more easily.  Use a humidifier.  Inhale steam 3-4 times a day (for example, sit in the bathroom with the shower running).  Apply a warm, moist washcloth to your face 3-4 times a day, or as directed by your health care provider.  Use saline nasal sprays to help moisten and clean your sinuses.  Take medicines only as directed by your health care provider.  If you were prescribed either an antibiotic or antifungal medicine, finish it all even if you start to feel better. SEEK IMMEDIATE MEDICAL CARE IF:  You have increasing pain or severe headaches.  You have nausea, vomiting, or drowsiness.  You have swelling around your face.  You have vision problems.  You have a stiff neck.  You have difficulty breathing.   This information is not intended to replace advice given to you by your health care provider. Make sure you discuss any questions you have with your health care provider.   Document Released: 11/17/2005 Document Revised: 12/08/2014  Document Reviewed: 12/02/2011 Elsevier Interactive Patient Education [employer].

## 2016-04-24 LAB — VITAMIN D 25 HYDROXY (VIT D DEFICIENCY, FRACTURES): Vit D, 25-Hydroxy: 14 ng/mL — ABNORMAL LOW (ref 30–100)

## 2016-05-12 DIAGNOSIS — E559 Vitamin D deficiency, unspecified: Secondary | ICD-10-CM | POA: Insufficient documentation

## 2016-05-12 MED ORDER — VITAMIN D (ERGOCALCIFEROL) 1.25 MG (50000 UNIT) PO CAPS: 50000.0000 [IU] | ORAL_CAPSULE | ORAL | Status: DC

## 2016-05-12 NOTE — Addendum Note (Signed)
Addended by: [name] on: 05/12/2016 07:51 AM   Modules accepted: Orders, SmartSet

## 2016-06-30 ENCOUNTER — Other Ambulatory Visit: Payer: Self-pay | Admitting: Family Medicine

## 2016-07-01 ENCOUNTER — Telehealth: Payer: Self-pay

## 2016-07-01 NOTE — Telephone Encounter (Signed)
Pt is checking on status of her medication request from the pharmacy yesterday pt is wanting to let us know that she is going out of town tomorrow and needs this filled as soon as possible

## 2016-07-02 NOTE — Telephone Encounter (Signed)
Ok to call in a refill. thanks

## 2016-07-03 MED ORDER — ALPRAZOLAM 0.5 MG PO TABS: ORAL_TABLET | ORAL | 0 refills | Status: DC

## 2016-07-03 NOTE — Telephone Encounter (Signed)
Called in Rx. I tried to call -pt, but unable to leave message.

## 2016-07-03 NOTE — Telephone Encounter (Signed)
Medication  ALPRAZolam (XANAX) 0.5 MG tablet [325]  ALPRAZolam (XANAX) 0.5 MG tablet [id number]  Order Details  Dose, Route, Frequency: As Directed   Dispense Quantity:  30 tablet Refills:  5 Fills remaining:  --        Sig: TAKE 1 TABLET BY MOUTH EVERY NIGHT AT BEDTIME FOR ANXIETY/ SLEEP       Written Date:  12/20/15 Expiration Date:  06/17/16    Start Date:  12/20/15 End Date:  --         Ordering Provider:  -- DEA #:  -- NPI:  --   Authorizing Provider:  Shawnee Knapp, MD DEA #:  [id number] NPI:  [id number]   Ordering User:  Shawnee Knapp, MD            Original Order:  ALPRAZolam Duanne Moron) 0.5 MG tablet [id number]    Pharmacy:  [facility] [place], [place]

## 2016-07-08 ENCOUNTER — Other Ambulatory Visit: Payer: Self-pay | Admitting: Family Medicine

## 2016-07-08 MED ORDER — ALPRAZOLAM 0.5 MG PO TABS: ORAL_TABLET | ORAL | 4 refills | Status: DC

## 2016-07-08 NOTE — Progress Notes (Signed)
Reviewed [facility] - pt filling #30 of alprazolam 0.54m every 6-8 wks.  Filled 3 rx in 7 mos. Called in refill.

## 2016-09-11 ENCOUNTER — Encounter: Payer: Self-pay | Admitting: Family Medicine

## 2016-09-11 ENCOUNTER — Ambulatory Visit (INDEPENDENT_AMBULATORY_CARE_PROVIDER_SITE_OTHER): Payer: BC Managed Care – PPO | Admitting: Family Medicine

## 2016-09-11 VITALS — BP 116/88 | HR 98 | Temp 98.3°F | Resp 16 | Ht 66.25 in | Wt 192.4 lb

## 2016-09-11 DIAGNOSIS — M958 Other specified acquired deformities of musculoskeletal system: Secondary | ICD-10-CM | POA: Diagnosis not present

## 2016-09-11 DIAGNOSIS — Z23 Encounter for immunization: Secondary | ICD-10-CM

## 2016-09-11 DIAGNOSIS — R1011 Right upper quadrant pain: Secondary | ICD-10-CM | POA: Diagnosis not present

## 2016-09-11 NOTE — Progress Notes (Signed)
Subjective:    Patient ID: Stacy Hull, female    DOB: 1971-01-05, 45 y.o.   MRN: [id number] Chief Complaint  Patient presents with  . Follow-up    hernia with pain and bloating x 2 weeks  . Nausea    with bending    HPI  Stacy Hull is a 45 yo woman who presents today for check on a hernia.  She has a large rectus abdominal defect superior to the umbilicus and small umbilical hernia noted 2 yrs ago and was repaired 12/2014  She had her fallopian tubes out last Oct and now she has seen her obgyn and are going to get her uterus out this summer. She had a tiny mess  When she bends over she gets nauseated, has to eat standing up.  Wears a hernia band all the time but still with pain If she has her abd accidentally gets hit she is in agony, her kid can't lay her head there. They put in a little fascial defect contact fat.  Is trying to be as fit as she can be.    Still on vit D  2 pills every 8 hours  Sleeping ok  Past Medical History:  Diagnosis Date  . Allergic rhinitis   . Anxiety   . Depression   . HTN (hypertension)   . Hyperlipidemia   . Pneumonia    Past Surgical History:  Procedure Laterality Date  . CERVICAL CONE BIOPSY    . CESAREAN SECTION  2005  . HYSTEROSCOPY WITH NOVASURE  11/26/2012   Procedure: HYSTEROSCOPY WITH NOVASURE;  Surgeon: Marvene Staff, MD;  Location: [facility];  Service: Gynecology;  Laterality: N/A;  . PARATHYROIDECTOMY  1999   benign  . ROBOTIC ASSISTED LAPAROSCOPIC OVARIAN CYSTECTOMY Right 10/01/2015   Procedure: ROBOTIC ASSISTED LAPAROSCOPIC BILATERAL SALPINGECTOMY WITH LYSIS OF ADHESIONS AND RIGHT ADNEXAL CYST;  Surgeon: Princess Bruins, MD;  Location: [facility];  Service: Gynecology;  Laterality: Right;  POSSIBLE robot .  DO NOT DRAPE THE ROBOT.....she will use the laparoscopic camera to start.    . TUBAL LIGATION    . UMBILICAL HERNIA REPAIR    . WISDOM TOOTH EXTRACTION     Current Outpatient Prescriptions on File Prior to  Visit  Medication Sig Dispense Refill  . albuterol (PROVENTIL HFA;VENTOLIN HFA) 108 (90 BASE) MCG/ACT inhaler Inhale 1-2 puffs into the lungs every 4 (four) hours as needed for wheezing or shortness of breath. 1 Inhaler 0  . ALPRAZolam (XANAX) 0.5 MG tablet TAKE 1 TABLET BY MOUTH EVERY NIGHT AT BEDTIME FOR ANXIETY/ SLEEP 30 tablet 4  . citalopram (CELEXA) 20 MG tablet Take 1 tablet (20 mg total) by mouth daily. 90 tablet 4  . lisinopril-hydrochlorothiazide (PRINZIDE,ZESTORETIC) 20-25 MG tablet Take 1 tablet by mouth daily. 90 tablet 3  . Multiple Vitamin (MULTIVITAMIN) tablet Take 1 tablet by mouth daily.    . Vitamin D, Ergocalciferol, (DRISDOL) 50000 units CAPS capsule Take 1 capsule (50,000 Units total) by mouth every 7 (seven) days. 24 capsule 0   No current facility-administered medications on file prior to visit.    Allergies  Allergen Reactions  . Erythromycin Other (See Comments)    G I upset.   Family History  Problem Relation Age of Onset  . Depression Mother   . Hypertension Father   . Aneurysm Father 28    cerebral  . Diabetes Maternal Grandmother   . Diabetes Maternal Grandfather   . Stroke Paternal Grandfather 56   Social History  Social History  . Marital status: Married    Spouse name: Alex Mcmanigal  . Number of children: 2  . Years of education: Master's +   Occupational History  . [employer]   Social History Main Topics  . Smoking status: Never Smoker  . Smokeless tobacco: Never Used  . Alcohol use 2.0 oz/week    4 Standard drinks or equivalent per week     Comment: occasionally  . Drug use: No  . Sexual activity: Yes    Partners: Male   Other Topics Concern  . None   Social History Narrative   Lives with her husband and their 2 children. Walks 3x's weekly for exercise.    Review of Systems  Constitutional: Positive for activity change. Negative for appetite change, chills  and fever.  Gastrointestinal: Positive for abdominal distention, abdominal pain and nausea. Negative for anal bleeding, blood in stool and vomiting.  Genitourinary: Positive for menstrual problem.  Musculoskeletal: Negative for gait problem and joint swelling.  Skin: Negative for color change and rash.  Hematological: Does not bruise/bleed easily.  Psychiatric/Behavioral: Negative for dysphoric mood and sleep disturbance. The patient is nervous/anxious.        Objective:   Physical Exam  Constitutional: She is oriented to person, place, and time. She appears well-developed and well-nourished. No distress.  HENT:  Head: Normocephalic and atraumatic.  Right Ear: External ear normal.  Left Ear: External ear normal.  Eyes: Conjunctivae are normal. No scleral icterus.  Neck: Normal range of motion. Neck supple. No thyromegaly present.  Cardiovascular: Normal rate, regular rhythm, normal heart sounds and intact distal pulses.   Pulmonary/Chest: Effort normal and breath sounds normal. No respiratory distress.  Abdominal: Normal appearance and bowel sounds are normal. There is no hepatosplenomegaly. There is tenderness in the right upper quadrant and right lower quadrant. There is no rigidity, no rebound, no CVA tenderness and negative Murphy's sign. A hernia is present. Hernia confirmed positive in the ventral area.  Musculoskeletal: She exhibits no edema.  Lymphadenopathy:    She has no cervical adenopathy.  Neurological: She is alert and oriented to person, place, and time.  Skin: Skin is warm and dry. She is not diaphoretic. No erythema.  Psychiatric: She has a normal mood and affect. Her behavior is normal.     BP 116/88 (Patient Position: Sitting, Cuff Size: Large)   Pulse 98   Temp 98.3 F (36.8 C) (Oral)   Resp 16   Ht 5' 6.25" (1.683 m)   Wt 192 lb 6.4 oz (87.3 kg)   LMP 09/08/2016   SpO2 97%   BMI 30.82 kg/m       Assessment & Plan:   1. Need for prophylactic  vaccination and inoculation against influenza   2. Right upper quadrant abdominal pain - Pt did have a weak abdominal wall noted on CT scan last year - this has clearly significantly worsened and suspect now has progressed to hernia.  Making it very difficult to work, exercise, bend, etc. To large of an area to continue to do a binder indefinitely. Repeat CT and Refer to plastics surgery - Dr. Steele Sizer    Orders Placed This Encounter  Procedures  . Flu Vaccine QUAD 36+ mos IM     [name], M.D.  [facility] [address] [place], [place] [address] [phone] phone [phone] fax  09/19/16 2:40  PM

## 2016-09-11 NOTE — Patient Instructions (Addendum)
IF you received an x-ray today, you will receive an invoice from [facility]. Please contact [facility] at [phone] with questions or concerns regarding your invoice.   IF you received labwork today, you will receive an invoice from [employer]. Please contact [facility] at [phone] with questions or concerns regarding your invoice.   Our billing staff will not be able to assist you with questions regarding bills from these companies.  You will be contacted with the lab results as soon as they are available. The fastest way to get your results is to activate your My Chart account. Instructions are located on the last page of this paperwork. If you have not heard from [place] regarding the results in 2 weeks, please contact this office.    Hernia, Adult A hernia is the bulging of an organ or tissue through a weak spot in the muscles of the abdomen (abdominal wall). Hernias develop most often near the navel or groin. There are many kinds of hernias. Common kinds include:  Femoral hernia. This kind of hernia develops under the groin in the upper thigh area.  Inguinal hernia. This kind of hernia develops in the groin or scrotum.  Umbilical hernia. This kind of hernia develops near the navel.  Hiatal hernia. This kind of hernia causes part of the stomach to be pushed up into the chest.  Incisional hernia. This kind of hernia bulges through a scar from an abdominal surgery. CAUSES This condition may be caused by:  Heavy lifting.  Coughing over a long period of time.  Straining to have a bowel movement.  An incision made during an abdominal surgery.  A birth defect (congenital defect).  Excess weight or obesity.  Smoking.  Poor nutrition.  Cystic fibrosis.  Excess fluid in the abdomen.  Undescended testicles. SYMPTOMS Symptoms of a hernia include:  A lump on the abdomen. This is the first sign of a hernia. The lump may  become more obvious with standing, straining, or coughing. It may get bigger over time if it is not treated or if the condition causing it is not treated.  Pain. A hernia is usually painless, but it may become painful over time if treatment is delayed. The pain is usually dull and may get worse with standing or lifting heavy objects. Sometimes a hernia gets tightly squeezed in the weak spot (strangulated) or stuck there (incarcerated) and causes additional symptoms. These symptoms may include:  Vomiting.  Nausea.  Constipation.  Irritability. DIAGNOSIS A hernia may be diagnosed with:  A physical exam. During the exam your health care provider may ask you to cough or to make a specific movement, because a hernia is usually more visible when you move.  Imaging tests. These can include:  X-rays.  Ultrasound.  CT scan. TREATMENT A hernia that is small and painless may not need to be treated. A hernia that is large or painful may be treated with surgery. Inguinal hernias may be treated with surgery to prevent incarceration or strangulation. Strangulated hernias are always treated with surgery, because lack of blood to the trapped organ or tissue can cause it to die. Surgery to treat a hernia involves pushing the bulge back into place and repairing the weak part of the abdomen. HOME CARE INSTRUCTIONS  Avoid straining.  Do not lift anything heavier than 10 lb (4.5 kg).  Lift with your leg muscles, not your back muscles. This helps avoid strain.  When coughing, try to cough gently.  Prevent constipation. Constipation leads to straining with bowel movements, which can make a hernia worse or cause a hernia repair to break down. You can prevent constipation by:  Eating a high-fiber diet that includes plenty of fruits and vegetables.  Drinking enough fluids to keep your urine clear or pale yellow. Aim to drink 6-8 glasses of water per day.  Using a stool softener as directed by your  health care provider.  Lose weight, if you are overweight.  Do not use any tobacco products, including cigarettes, chewing tobacco, or electronic cigarettes. If you need help quitting, ask your health care provider.  Keep all follow-up visits as directed by your health care provider. This is important. Your health care provider may need to monitor your condition. SEEK MEDICAL CARE IF:  You have swelling, redness, and pain in the affected area.  Your bowel habits change. SEEK IMMEDIATE MEDICAL CARE IF:  You have a fever.  You have abdominal pain that is getting worse.  You feel nauseous or you vomit.  You cannot push the hernia back in place by gently pressing on it while you are lying down.  The hernia:  Changes in shape or size.  Is stuck outside the abdomen.  Becomes discolored.  Feels hard or tender.   This information is not intended to replace advice given to you by your health care provider. Make sure you discuss any questions you have with your health care provider.   Document Released: 11/17/2005 Document Revised: 12/08/2014 Document Reviewed: 09/27/2014 Elsevier Interactive Patient Education [employer].

## 2016-09-16 ENCOUNTER — Telehealth: Payer: Self-pay

## 2016-09-16 NOTE — Telephone Encounter (Signed)
Patient called to ask about when the CT scan will be scheduled. The order is currently pending and therefore cannot be scheduled. Did she still need to have this preformed?

## 2016-09-16 NOTE — Telephone Encounter (Signed)
Dr. Brigitte Pulse please advise..Thank you!

## 2016-09-19 NOTE — Telephone Encounter (Signed)
Patient called again today for an update. Stated that she is experiencing quite a bit of discomfort, that the pain is escalating and not improving. Please advise!

## 2016-09-19 NOTE — Telephone Encounter (Signed)
I'm so sorry that it took me so long to sign off. Please give pt my apologies. Ordered.  I also put in an order for plastic surg on [facility] - I would like this to go to dr. Steele Sizer and have the appt scheduled for after the CT.  Remind pt to have the imaging center give her a copy of the CT CD to bring with her to that appt.

## 2016-09-20 ENCOUNTER — Ambulatory Visit: Payer: BC Managed Care – PPO

## 2016-09-23 ENCOUNTER — Ambulatory Visit
Admission: RE | Admit: 2016-09-23 | Discharge: 2016-09-23 | Disposition: A | Discharge status: Home / Self Care | Payer: BC Managed Care – PPO | Source: Ambulatory Visit | Attending: Family Medicine | Admitting: Family Medicine

## 2016-09-23 DIAGNOSIS — R1011 Right upper quadrant pain: Secondary | ICD-10-CM

## 2016-09-26 ENCOUNTER — Ambulatory Visit (INDEPENDENT_AMBULATORY_CARE_PROVIDER_SITE_OTHER): Payer: BC Managed Care – PPO | Admitting: Family Medicine

## 2016-09-26 VITALS — BP 126/80 | HR 86 | Temp 98.2°F | Resp 16 | Ht 66.25 in | Wt 195.0 lb

## 2016-09-26 DIAGNOSIS — M958 Other specified acquired deformities of musculoskeletal system: Secondary | ICD-10-CM

## 2016-09-26 DIAGNOSIS — R1011 Right upper quadrant pain: Secondary | ICD-10-CM

## 2016-09-26 MED ORDER — CYCLOBENZAPRINE HCL 10 MG PO TABS: 10.0000 mg | ORAL_TABLET | Freq: Three times a day (TID) | ORAL | 0 refills | Status: DC | PRN

## 2016-09-26 MED ORDER — ALPRAZOLAM 0.5 MG PO TABS: 0.5000 mg | ORAL_TABLET | Freq: Three times a day (TID) | ORAL | 1 refills | Status: DC | PRN

## 2016-09-26 MED ORDER — HYDROCODONE-ACETAMINOPHEN 5-325 MG PO TABS: 1.0000 | ORAL_TABLET | ORAL | 0 refills | Status: DC | PRN

## 2016-09-26 NOTE — Patient Instructions (Signed)
     IF you received an x-ray today, you will receive an invoice from [facility]. Please contact [facility] at [phone] with questions or concerns regarding your invoice.   IF you received labwork today, you will receive an invoice from [facility]. Please contact [facility] at [phone] with questions or concerns regarding your invoice.   Our billing staff will not be able to assist you with questions regarding bills from these companies.  You will be contacted with the lab results as soon as they are available. The fastest way to get your results is to activate your My Chart account. Instructions are located on the last page of this paperwork. If you have not heard from us regarding the results in 2 weeks, please contact this office.      

## 2016-09-26 NOTE — Progress Notes (Addendum)
Subjective:  By signing my name below, I, Stacy Hull, attest that this documentation has been prepared under the direction and in the presence of Stacy Cheadle, MD. Electronically Signed: Moises Hull, [place]. 09/26/2016 , 12:27 PM .  Patient was seen in Room 13 .   Patient ID: Stacy Hull, female    DOB: [birth year], 45 y.o.   MRN: [id number] Chief Complaint  Patient presents with  . Follow-up    hernia   HPI Stacy Hull is a 45 y.o. female who presents to [facility] for follow up on hernia. Her referral to plastic surgery was sent to Dr. Migdalia Dk at [facility] was set for 10/02/2016. But, her referral was switched to [facility] and on the 10/07/2016, which is a later date than initially.   She's been wearing her hernia band all the time, but is still having pain. If her abdomen gets slightly bumped while at school, she is in agony. This has been increasing her anxiety levels. 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 She plans to file FMLA until her hernia is repaired.   She's been taking her xanax, half tablet when she wakes up from sleep in the middle of the night. She's also taken half tablet xanax during some mornings where she feels really stressed (ie, this morning).   Past Medical History:  Diagnosis Date  . Allergic rhinitis   . Anxiety   . Depression   . HTN (hypertension)   . Hyperlipidemia   . Pneumonia    Prior to Admission medications   Medication Sig Start Date End Date Taking? Authorizing Provider  albuterol (PROVENTIL HFA;VENTOLIN HFA) 108 (90 BASE) MCG/ACT inhaler Inhale 1-2 puffs into the lungs every 4 (four) hours as needed for wheezing or shortness of breath. 09/05/14  Yes Wendie Agreste, MD  ALPRAZolam Duanne Moron) 0.5 MG tablet TAKE 1 TABLET BY MOUTH EVERY NIGHT AT BEDTIME FOR ANXIETY/ SLEEP 07/08/16  Yes Shawnee Knapp, MD  citalopram (CELEXA) 20 MG tablet Take 1 tablet (20 mg total) by mouth daily. 12/20/15  Yes  Shawnee Knapp, MD  lisinopril-hydrochlorothiazide (PRINZIDE,ZESTORETIC) 20-25 MG tablet Take 1 tablet by mouth daily. 12/20/15  Yes Shawnee Knapp, MD  Multiple Vitamin (MULTIVITAMIN) tablet Take 1 tablet by mouth daily.   Yes Historical Provider, MD  Vitamin D, Ergocalciferol, (DRISDOL) 50000 units CAPS capsule Take 1 capsule (50,000 Units total) by mouth every 7 (seven) days. 05/12/16  Yes Shawnee Knapp, MD   Allergies  Allergen Reactions  . Erythromycin Other (See Comments)    G I upset.   Review of Systems  Constitutional: Negative for chills, fatigue, fever and unexpected weight change.  Respiratory: Negative for cough.   Gastrointestinal: Positive for abdominal pain. Negative for constipation, diarrhea, nausea and vomiting.  Skin: Negative for rash and wound.  Neurological: Negative for dizziness, weakness and headaches.  Psychiatric/Behavioral: The patient is nervous/anxious.        Objective:   Physical Exam  Constitutional: She is oriented to person, place, and time. She appears well-developed and well-nourished. No distress.  HENT:  Head: Normocephalic and atraumatic.  Eyes: EOM are normal. Pupils are equal, round, and reactive to light.  Neck: Neck supple.  Cardiovascular: Normal rate.   Pulmonary/Chest: Effort normal. No respiratory distress.  Musculoskeletal: Normal range of motion.  Neurological: She is alert and oriented to person, place, and time.  Skin: Skin is warm and dry.  Psychiatric: Her speech is  normal and behavior is normal. Judgment and thought content normal. Her mood appears anxious. Cognition and memory are normal. She exhibits a depressed mood.  tearful  Nursing note and vitals reviewed.   BP 126/80   Pulse 86   Temp 98.2 F (36.8 C) (Oral)   Resp 16   Ht 5' 6.25" (1.683 m)   Wt 195 lb (88.5 kg)   LMP 09/08/2016   SpO2 97%   BMI 31.24 kg/m     Assessment & Plan:   1. Abdominal wall defect, acquired   2. Right upper quadrant abdominal pain    Pt  is clearly absolutely miserable with pain.  The worsening pain is clearly triggering a lot of anxiety and depression in pt. I think the chronicity of it combined with the worsening and the constant fear that she is going to be in a situation where she is going to have an unintentional abdominal injury - which occurs with minor force or movement. Advised pt that I think she needs to go out of work on [employer]. 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  She will contact her HR and have FMLA forms sent to me for completion.  She will try to get on the cancellation list for the plastic surgeons office in hopes of being seen asap so she can know what her trx options are and get a better idea of what her upcoming mos might look like. If she is having more severe muscle spasms, could consider changing her alprazolam to diazepam.  She is using her alprazolam more freq due to the anxetiy her pain is causing but at baseline has only used qhs prn insomnia for many years. Call/email in 1 wk to let me know how she is doing and decide how much longer she needs to be out of work.   Meds ordered this encounter  Medications  . HYDROcodone-acetaminophen (NORCO/VICODIN) 5-325 MG tablet    Sig: Take 1-2 tablets by mouth every 4 (four) hours as needed for moderate pain.    Dispense:  60 tablet    Refill:  0  . cyclobenzaprine (FLEXERIL) 10 MG tablet    Sig: Take 1 tablet (10 mg total) by mouth 3 (three) times daily as needed for muscle spasms.    Dispense:  60 tablet    Refill:  0  . ALPRAZolam (XANAX) 0.5 MG tablet    Sig: Take 1 tablet (0.5 mg total) by mouth 3 (three) times daily as needed for anxiety.    Dispense:  90 tablet    Refill:  1   Over 25 min spent in face-to-face evaluation of and consultation with patient and coordination of care.  Over 50% of this time was spent counseling this  patient.  I personally performed the services described in this documentation, which was scribed in my presence. The recorded information has been reviewed and considered, and addended by me as needed.   Stacy Hull, M.D.  [facility] [address] [place], [place] [address] [phone] phone [phone] fax  09/26/16 12:59 PM

## 2016-09-29 ENCOUNTER — Telehealth: Payer: Self-pay

## 2016-09-29 NOTE — Telephone Encounter (Signed)
Pt calling to let you know she has faxed over [facility] paperwork and that her appointment from referral has been scheduled

## 2016-09-30 NOTE — Telephone Encounter (Signed)
Patient needs FMLA forms completed by Dr Brigitte Pulse based off her lat OV for a hernia. I have completed what I could from the [facility] notes and highlighted the areas that need to be completed. I will place the forms in your box on 09/30/16 if you could please return them to the FMLA/Disability box at the 102 checkout desk within 5-7 business days.

## 2016-10-01 ENCOUNTER — Encounter: Payer: Self-pay | Admitting: Family Medicine

## 2016-10-03 ENCOUNTER — Telehealth: Payer: Self-pay

## 2016-10-03 NOTE — Telephone Encounter (Signed)
See e-mail  Extend limitations this week as last week.   [phone]

## 2016-10-04 NOTE — Telephone Encounter (Signed)
email answered and letter written

## 2016-10-04 NOTE — Telephone Encounter (Signed)
See email and note

## 2016-10-04 NOTE — Telephone Encounter (Signed)
Yep, that is fine.  You can copy and paste the last letter I wrote and adjusts the dates.  See email. Thanks.

## 2016-10-07 DIAGNOSIS — Z8719 Personal history of other diseases of the digestive system: Secondary | ICD-10-CM | POA: Insufficient documentation

## 2016-10-07 DIAGNOSIS — R1011 Right upper quadrant pain: Secondary | ICD-10-CM | POA: Insufficient documentation

## 2016-10-08 ENCOUNTER — Encounter: Payer: Self-pay | Admitting: Family Medicine

## 2016-10-08 ENCOUNTER — Telehealth: Payer: Self-pay

## 2016-10-08 NOTE — Telephone Encounter (Signed)
Msg is for Dr. Brigitte Pulse, She wrote referral about hernia. She was put on half days with some limitations. She went to see  Dr. Suzzanne Cloud but he sent her to her initial doctor Norma Fredrickson for her first hernia issue. That appointment is on Monday. Pt would like for extended medical limitation until next week because she initiated FMLA and she wants to make sure she has all the proper documentation.  Please advise  [phone]

## 2016-10-08 NOTE — Telephone Encounter (Signed)
Please advise 

## 2016-10-08 NOTE — Telephone Encounter (Signed)
Yes, absolutely fine. Please copy prior letter and adjust dates.  So for her FMLA I'll right that she went on restricted duties sev wks ago - any idea of how long we should extend these restricted duties - if she is seeing the surgeon on Monday 13th perhaps we should keep her on that through Mon  11/26??  That would give [place] 2 weeks to hopefully figure out when her surgery is going to be and when she will need to go out completley and for how long. . .   Please confirm w/ pt, thanks.

## 2016-10-10 ENCOUNTER — Ambulatory Visit (INDEPENDENT_AMBULATORY_CARE_PROVIDER_SITE_OTHER): Payer: BC Managed Care – PPO | Admitting: Family Medicine

## 2016-10-10 VITALS — BP 124/70 | HR 68 | Temp 98.2°F | Resp 16 | Ht 66.25 in | Wt 195.0 lb

## 2016-10-10 DIAGNOSIS — R1011 Right upper quadrant pain: Secondary | ICD-10-CM | POA: Diagnosis not present

## 2016-10-10 DIAGNOSIS — M958 Other specified acquired deformities of musculoskeletal system: Secondary | ICD-10-CM | POA: Diagnosis not present

## 2016-10-10 NOTE — Progress Notes (Signed)
Subjective:  By signing my name below, I, [name], attest that this documentation has been prepared under the direction and in the presence of [name], MD Electronically Signed: Ladene Artist, ED Scribe 10/10/2016 at 10:55 AM.   Patient ID: Stacy Hull, female    DOB: [birth year], 45 y.o.   MRN: [id number]  Chief Complaint  Patient presents with  . Follow-up    Hernia, need FMLA forms filled out    HPI HPI Comments: Stacy Hull is a 45 y.o. female who presents to [facility] for a follow-up regarding a hernia and to have FMLA forms filled out. She is requesting 4 hours/day until she is able to have surgery. Pt saw Dr. Marla Roe who was unable to see the mesh on the CT scan but told her to cut carbohydrates and sugar for 10 days to decrease abdominal distension. Pt states that she is doing a little better but she believes that that is due to resting. She has an upcoming appointment with Dr. Norma Fredrickson on 10/13/16. Pshx includes mesh repair in January 2016.   Past Medical History:  Diagnosis Date  . Allergic rhinitis   . Anxiety   . Depression   . HTN (hypertension)   . Hyperlipidemia   . Pneumonia    Current Outpatient Prescriptions on File Prior to Visit  Medication Sig Dispense Refill  . albuterol (PROVENTIL HFA;VENTOLIN HFA) 108 (90 BASE) MCG/ACT inhaler Inhale 1-2 puffs into the lungs every 4 (four) hours as needed for wheezing or shortness of breath. 1 Inhaler 0  . ALPRAZolam (XANAX) 0.5 MG tablet Take 1 tablet (0.5 mg total) by mouth 3 (three) times daily as needed for anxiety. 90 tablet 1  . citalopram (CELEXA) 20 MG tablet Take 1 tablet (20 mg total) by mouth daily. 90 tablet 4  . cyclobenzaprine (FLEXERIL) 10 MG tablet Take 1 tablet (10 mg total) by mouth 3 (three) times daily as needed for muscle spasms. 60 tablet 0  . HYDROcodone-acetaminophen (NORCO/VICODIN) 5-325 MG tablet Take 1-2 tablets by mouth every 4 (four) hours as needed  for moderate pain. 60 tablet 0  . lisinopril-hydrochlorothiazide (PRINZIDE,ZESTORETIC) 20-25 MG tablet Take 1 tablet by mouth daily. 90 tablet 3  . Multiple Vitamin (MULTIVITAMIN) tablet Take 1 tablet by mouth daily.    . Vitamin D, Ergocalciferol, (DRISDOL) 50000 units CAPS capsule Take 1 capsule (50,000 Units total) by mouth every 7 (seven) days. 24 capsule 0   No current facility-administered medications on file prior to visit.    Allergies  Allergen Reactions  . Erythromycin Other (See Comments)    G I upset.   Review of Systems  Gastrointestinal: Positive for abdominal pain.  see hpi  BP 124/70   Pulse 68   Temp 98.2 F (36.8 C) (Oral)   Resp 16   Ht 5' 6.25" (1.683 m)   Wt 195 lb (88.5 kg)   LMP 09/08/2016   SpO2 98%   BMI 31.24 kg/m     Objective:   Physical Exam  Constitutional: She is oriented to person, place, and time. She appears well-developed and well-nourished. No distress.  HENT:  Head: Normocephalic and atraumatic.  Eyes: Conjunctivae and EOM are normal.  Neck: Neck supple. No tracheal deviation present.  Cardiovascular: Normal rate.   Pulmonary/Chest: Effort normal. No respiratory distress.  Musculoskeletal: Normal range of motion.  Neurological: She is alert and oriented to person, place, and time.  Skin: Skin is warm and dry.  Psychiatric:  She has a normal mood and affect. Her behavior is normal.  Nursing note and vitals reviewed.     Assessment & Plan:   1. Abdominal wall defect, acquired   2. Right upper quadrant abdominal pain    Pt saw plastic surg who wanted her to see the general surgeon that did her umbilical hernia mesh repair last yr since mesh was not visible on imaging. Pt also needs a hysterectomy - she will ask her gynecology if there might be a chance to do this at the same time.  Unfortunately, I think that this may be a difficult surgery due to large area of abd wall laxity and weakening rather than true hernia. However, pt's pain  is so severe and persistent even with a binder and rest that she is clearly going to have to try some type of surg repair. FMLA forms completed during OV - she will need to be oow for the next 2 mos to provide her adequate time to have the surgery sched and time to heal prior to returning.  I personally performed the services described in this documentation, which was scribed in my presence. The recorded information has been reviewed and considered, and addended by me as needed.   [name], M.D.  [facility] [address] [place], [place] [address] [phone] phone [phone] fax  10/19/16 1:36 AM

## 2016-10-10 NOTE — Telephone Encounter (Signed)
Done during OV today 

## 2016-10-10 NOTE — Patient Instructions (Signed)
     IF you received an x-ray today, you will receive an invoice from [facility]. Please contact [facility] at [phone] with questions or concerns regarding your invoice.   IF you received labwork today, you will receive an invoice from [facility]. Please contact [facility] at [phone] with questions or concerns regarding your invoice.   Our billing staff will not be able to assist you with questions regarding bills from these companies.  You will be contacted with the lab results as soon as they are available. The fastest way to get your results is to activate your My Chart account. Instructions are located on the last page of this paperwork. If you have not heard from us regarding the results in 2 weeks, please contact this office.      

## 2016-10-13 NOTE — Telephone Encounter (Signed)
Paperwork scanned and faxed to employer on 10/13/16

## 2016-10-27 ENCOUNTER — Ambulatory Visit (INDEPENDENT_AMBULATORY_CARE_PROVIDER_SITE_OTHER): Payer: BC Managed Care – PPO | Admitting: Family Medicine

## 2016-10-27 VITALS — BP 136/86 | HR 111 | Temp 98.8°F | Resp 18 | Ht 67.0 in | Wt 193.0 lb

## 2016-10-27 DIAGNOSIS — R05 Cough: Secondary | ICD-10-CM | POA: Diagnosis not present

## 2016-10-27 DIAGNOSIS — R059 Cough, unspecified: Secondary | ICD-10-CM

## 2016-10-27 DIAGNOSIS — R6889 Other general symptoms and signs: Secondary | ICD-10-CM | POA: Diagnosis not present

## 2016-10-27 DIAGNOSIS — J069 Acute upper respiratory infection, unspecified: Secondary | ICD-10-CM | POA: Diagnosis not present

## 2016-10-27 LAB — POCT INFLUENZA A/B
INFLUENZA A, POC: NEGATIVE
Influenza B, POC: NEGATIVE

## 2016-10-27 MED ORDER — BENZONATATE 100 MG PO CAPS: 100.0000 mg | ORAL_CAPSULE | Freq: Three times a day (TID) | ORAL | 0 refills | Status: DC | PRN

## 2016-10-27 NOTE — Progress Notes (Signed)
Patient ID: Stacy Hull, female    DOB: Apr 02, 1971  Age: 45 y.o. MRN: [id number]  Chief Complaint  Patient presents with  . Sore Throat    x 2 days  . Nasal Congestion  . Chills  . Fever  . 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 The head staying congested and some mucus production. She sneezing some. She is coughing. She had a sore throat initially that is subsided to some degree. The ears have felt stuffy and she's had chills and fever. A little while ago her temperature was 100 before she came in here.  Current allergies, medications, problem list, past/family and social histories reviewed.  Objective:  BP 136/86 (BP Location: Left Arm, Patient Position: Sitting, Cuff Size: Large)   Pulse (!) 111   Temp 98.8 F (37.1 C)   Resp 18   Ht 5\' 7"  (1.702 m)   Wt 193 lb (87.5 kg)   LMP 10/13/2016 (Approximate)   SpO2 96%   BMI 30.23 kg/m   Somewhat ill-appearing. Red nose. TMs normal. Throat mildly erythematous without exudate. Neck supple without significant nodes. Chest is clear to auscultation. Heart regular without murmur but is tachycardic at 120 right now.  Assessment & Plan:   Assessment: 1. Flu-like symptoms   2. Acute upper respiratory infection   3. Cough       Plan: Check flu swab  Results for orders placed or performed in visit on 10/27/16  POCT Influenza A/B  Result Value Ref Range   Influenza A, POC Negative Negative   Influenza B, POC Negative Negative     Orders Placed This Encounter  Procedures  . POCT Influenza A/B    No orders of the defined types were placed in this encounter.        Patient Instructions   Drink plenty of fluids and get enough rest  Over-the-counter cough and cold preparations  Benzonatate cough pills one or 2 pills 3 times daily as needed for cough  Return if necessary  IF you received an x-ray today, you will  receive an invoice from [facility]. Please contact [facility] at [phone] with questions or concerns regarding your invoice.   IF you received labwork today, you will receive an invoice from [facility]. Please contact [facility] at [phone] with questions or concerns regarding your invoice.   Our billing staff will not be able to assist you with questions regarding bills from these companies.  You will be contacted with the lab results as soon as they are available. The fastest way to get your results is to activate your My Chart account. Instructions are located on the last page of this paperwork. If you have not heard from [place] regarding the results in 2 weeks, please contact this office.         Return if symptoms worsen or fail to improve.   Stacy Bedingfield, MD 10/27/2016

## 2016-10-27 NOTE — Patient Instructions (Addendum)
Drink plenty of fluids and get enough rest  Over-the-counter cough and cold preparations  Benzonatate cough pills one or 2 pills 3 times daily as needed for cough  Return if necessary  IF you received an x-ray today, you will receive an invoice from [facility]. Please contact [facility] at [phone] with questions or concerns regarding your invoice.   IF you received labwork today, you will receive an invoice from [facility]. Please contact [facility] at [phone] with questions or concerns regarding your invoice.   Our billing staff will not be able to assist you with questions regarding bills from these companies.  You will be contacted with the lab results as soon as they are available. The fastest way to get your results is to activate your My Chart account. Instructions are located on the last page of this paperwork. If you have not heard from [place] regarding the results in 2 weeks, please contact this office.

## 2016-11-03 ENCOUNTER — Ambulatory Visit (INDEPENDENT_AMBULATORY_CARE_PROVIDER_SITE_OTHER): Payer: BC Managed Care – PPO | Admitting: Family Medicine

## 2016-11-03 VITALS — BP 130/90 | HR 91 | Temp 98.6°F | Resp 16 | Ht 67.0 in | Wt 199.0 lb

## 2016-11-03 DIAGNOSIS — R059 Cough, unspecified: Secondary | ICD-10-CM

## 2016-11-03 DIAGNOSIS — R05 Cough: Secondary | ICD-10-CM

## 2016-11-03 DIAGNOSIS — J069 Acute upper respiratory infection, unspecified: Secondary | ICD-10-CM | POA: Diagnosis not present

## 2016-11-03 MED ORDER — BENZONATATE 200 MG PO CAPS: 200.0000 mg | ORAL_CAPSULE | Freq: Three times a day (TID) | ORAL | 0 refills | Status: DC | PRN

## 2016-11-03 MED ORDER — CODEINE POLT-CHLORPHEN POLT ER 14.7-2.8 MG/5ML PO SUER: 10.0000 mL | Freq: Two times a day (BID) | ORAL | 0 refills | Status: DC | PRN

## 2016-11-03 MED ORDER — FLUTICASONE PROPIONATE 50 MCG/ACT NA SUSP: 2.0000 | Freq: Every day | NASAL | 2 refills | Status: DC

## 2016-11-03 NOTE — Patient Instructions (Signed)
     IF you received an x-ray today, you will receive an invoice from [facility]. Please contact [facility] at [phone] with questions or concerns regarding your invoice.   IF you received labwork today, you will receive an invoice from [facility]. Please contact [facility] at [phone] with questions or concerns regarding your invoice.   Our billing staff will not be able to assist you with questions regarding bills from these companies.  You will be contacted with the lab results as soon as they are available. The fastest way to get your results is to activate your My Chart account. Instructions are located on the last page of this paperwork. If you have not heard from us regarding the results in 2 weeks, please contact this office.      

## 2016-11-03 NOTE — Progress Notes (Signed)
Subjective:  By signing my name below, I, [name], attest that this documentation has been prepared under the direction and in the presence of [name], MD Electronically Signed: Ladene Artist, ED Scribe 11/03/2016 at 5:03 PM.   Patient ID: Stacy Hull, female    DOB: 1971/05/20, 45 y.o.   MRN: [id number]  Chief Complaint  Patient presents with  . Follow-up    hernia   HPI HPI Comments: Stacy Hull is a 45 y.o. female who presents to the [facility] for a follow-up regarding hernia. Pt states that her insurance has okay the surgery and Dr. Rosendo Gros has sent over the availability but for some reason, Dr. Garwin Brothers has not put in the order to schedule the surgery. 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 Pt states that she is running out of FMLA days and she really wants to have the procedure before the holidays. Overall, she states that her pain has improved due to being on limitations.   Pt was seen in the office 1 week ago by Dr. Linna Darner for URI symptoms. Pt states that nasal congestion has resolved but she is still experiencing postnasal drip and a cough that keeps her up at night for the past 2 weeks. She has tried [employer] and Mucinex cold and cough with mild relief. Pt denies changes in bowels or appetite.   Past Medical History:  Diagnosis Date  . Allergic rhinitis   . Anxiety   . Depression   . HTN (hypertension)   . Hyperlipidemia   . Pneumonia    Current Outpatient Prescriptions on File Prior to Visit  Medication Sig Dispense Refill  . albuterol (PROVENTIL HFA;VENTOLIN HFA) 108 (90 BASE) MCG/ACT inhaler Inhale 1-2 puffs into the lungs every 4 (four) hours as needed for wheezing or shortness of breath. 1 Inhaler 0  . ALPRAZolam  (XANAX) 0.5 MG tablet Take 1 tablet (0.5 mg total) by mouth 3 (three) times daily as needed for anxiety. 90 tablet 1  . benzonatate (TESSALON) 100 MG capsule Take 1-2 capsules (100-200 mg total) by mouth 3 (three) times daily as needed. 30 capsule 0  . citalopram (CELEXA) 20 MG tablet Take 1 tablet (20 mg total) by mouth daily. 90 tablet 4  . cyclobenzaprine (FLEXERIL) 10 MG tablet Take 1 tablet (10 mg total) by mouth 3 (three) times daily as needed for muscle spasms. 60 tablet 0  . HYDROcodone-acetaminophen (NORCO/VICODIN) 5-325 MG tablet Take 1-2 tablets by mouth every 4 (four) hours as needed for moderate pain. 60 tablet 0  . lisinopril-hydrochlorothiazide (PRINZIDE,ZESTORETIC) 20-25 MG tablet Take 1 tablet by mouth daily. 90 tablet 3  . Multiple Vitamin (MULTIVITAMIN) tablet Take 1 tablet by mouth daily.    . Vitamin D, Ergocalciferol, (DRISDOL) 50000 units CAPS capsule Take 1 capsule (50,000 Units total) by mouth every 7 (seven) days. 24 capsule 0   No current facility-administered medications on file prior to visit.    Allergies  Allergen Reactions  . Erythromycin Other (See Comments)    G I upset.   Review of Systems  Constitutional: Negative for appetite change.  HENT: Positive for postnasal drip. Negative for congestion (resolved).   Respiratory: Positive for cough.   Gastrointestinal: Negative for constipation.      Objective:  Physical Exam  Constitutional: She is oriented to person, place, and time. She appears well-developed and well-nourished. No distress.  HENT:  Head: Normocephalic and atraumatic.  Right Ear: Tympanic membrane normal.  Left Ear: A middle ear effusion (moderate) is present.  Mouth/Throat: Posterior oropharyngeal erythema present.  Eyes: Conjunctivae and EOM are normal.  Neck: Neck supple. No tracheal deviation present.  Cardiovascular: Normal rate, regular rhythm, S1 normal and S2 normal.   Pulmonary/Chest: Effort normal and breath sounds normal. No  respiratory distress.  Abdominal: There is tenderness.  Musculoskeletal: Normal range of motion.  Lymphadenopathy:       Head (right side): Tonsillar adenopathy present.       Head (left side): Tonsillar adenopathy present.    She has cervical adenopathy (anterior).  Neurological: She is alert and oriented to person, place, and time.  Skin: Skin is warm and dry.  Psychiatric: She has a normal mood and affect. Her behavior is normal.  Nursing note and vitals reviewed.  BP 130/90 (BP Location: Right Arm, Patient Position: Sitting, Cuff Size: Normal)   Pulse 91   Temp 98.6 F (37 C) (Oral)   Resp 16   Ht 5' 7"  (1.702 m)   Wt 199 lb (90.3 kg)   LMP 10/13/2016 (Approximate)   SpO2 99%   BMI 31.17 kg/m     Assessment & Plan:   1. Upper respiratory tract infection, unspecified type   2. Cough    Sent email to Dr. Garwin Brothers to see if it might be possible to coordinate pt's hysterectomy to occur during the same surgical abdominal wall repair by Dr. Rosendo Gros asap - pt REALLY anxious and hoping to have the surgery done within the next 3 wks as she has already had to be out on FMLA for 20d and is appropriately concerned that she will not have sufficient FMLA days left to allow for adequate post-surgical recovery.  Meds ordered this encounter  Medications  . benzonatate (TESSALON) 200 MG capsule    Sig: Take 1 capsule (200 mg total) by mouth 3 (three) times daily as needed.    Dispense:  60 capsule    Refill:  0  . Codeine Polt-Chlorphen Polt ER (TUZISTRA XR) 14.7-2.8 MG/5ML SUER    Sig: Take 10 mLs by mouth 2 (two) times daily as needed (cough).    Dispense:  180 mL    Refill:  0  . fluticasone (FLONASE) 50 MCG/ACT nasal spray    Sig: Place 2 sprays into both nostrils at bedtime.    Dispense:  16 g    Refill:  2    I personally performed the services described in this documentation, which was scribed in my presence. The recorded information has been reviewed and considered, and  addended by me as needed.   [name], M.D.  [facility] [address] [place], [place] [address] [phone] phone [phone] fax  11/04/16 12:23 AM

## 2016-11-04 ENCOUNTER — Telehealth: Payer: Self-pay

## 2016-11-04 MED ORDER — PROMETHAZINE-CODEINE 6.25-10 MG/5ML PO SYRP: 10.0000 mL | ORAL_SOLUTION | ORAL | 0 refills | Status: DC | PRN

## 2016-11-04 NOTE — Telephone Encounter (Signed)
Pt is calling the prescription she called in for her cough ( codeine polt chlorphen ) is too expensive and she Is curious if shaw can give her something cheaper instead    Please advise [phone]

## 2016-11-04 NOTE — Telephone Encounter (Signed)
Spoke with pt.  She did not present coupon with prescription which would have reduced cost to $15. Dr. Brigitte Pulse gave rx for another cough med.  Faxed to [facility] Attempted to call pt to advise. - Mail box not set up.  Note on fax to contact pt when rx ready.

## 2016-11-04 NOTE — Addendum Note (Signed)
Addended by: [name] on: 11/04/2016 09:58 AM   Modules accepted: Orders

## 2016-11-07 ENCOUNTER — Other Ambulatory Visit: Payer: Self-pay | Admitting: Family Medicine

## 2016-11-08 NOTE — Telephone Encounter (Signed)
Patient was here 09/26/16 for hernia, you gave Flexeril then, was here 11/03/16 for URI issues. Ok to refill her Flexeril ?

## 2016-11-10 ENCOUNTER — Ambulatory Visit: Payer: Self-pay | Admitting: General Surgery

## 2016-11-11 ENCOUNTER — Other Ambulatory Visit: Payer: Self-pay | Admitting: Obstetrics and Gynecology

## 2016-11-12 NOTE — Patient Instructions (Signed)
Your procedure is scheduled on:  Friday, Dec. 22, 2017  Enter through the [employer] [facility] at:  6:00 AM  Pick up the phone at the desk and dial [phone].  Call this number if you have problems the morning of surgery: [phone].  Remember: Do NOT eat food or drink after:  Midnight Thursday, Dec. 21, 2017  Take these medicines the morning of surgery with a SIP OF WATER:  Citalopram, Lisinopril, Xanax if needed  Bring Asthma Inhaler day of surgery  Stop ALL herbal medications at this time   Do NOT wear jewelry (body piercing), metal hair clips/bobby pins, make-up, or nail polish. Do NOT wear lotions, powders, or perfumes.  You may wear deodorant. Do NOT shave for 48 hours prior to surgery. Do NOT bring valuables to the hospital. Contacts, dentures, or bridgework may not be worn into surgery.  Leave suitcase in car.  After surgery it may be brought to your room.  For patients admitted to the hospital, checkout time is 11:00 AM the day of discharge.

## 2016-11-13 ENCOUNTER — Encounter (HOSPITAL_COMMUNITY)
Admission: RE | Admit: 2016-11-13 | Discharge: 2016-11-13 | Disposition: A | Discharge status: Home / Self Care | Payer: BC Managed Care – PPO | Source: Ambulatory Visit | Attending: Obstetrics and Gynecology | Admitting: Obstetrics and Gynecology

## 2016-11-13 ENCOUNTER — Other Ambulatory Visit: Payer: Self-pay

## 2016-11-13 ENCOUNTER — Encounter (HOSPITAL_COMMUNITY): Payer: Self-pay

## 2016-11-13 DIAGNOSIS — Z01818 Encounter for other preprocedural examination: Secondary | ICD-10-CM | POA: Diagnosis present

## 2016-11-13 HISTORY — DX: Hyperparathyroidism, unspecified: E21.3

## 2016-11-13 HISTORY — DX: Gastro-esophageal reflux disease without esophagitis: K21.9

## 2016-11-13 HISTORY — DX: Unspecified asthma, uncomplicated: J45.909

## 2016-11-13 LAB — CBC
HEMATOCRIT: 40.6 % (ref 36.0–46.0)
HEMOGLOBIN: 14.3 g/dL (ref 12.0–15.0)
MCH: 32.9 pg (ref 26.0–34.0)
MCHC: 35.2 g/dL (ref 30.0–36.0)
MCV: 93.3 fL (ref 78.0–100.0)
Platelets: 325 10*3/uL (ref 150–400)
RBC: 4.35 MIL/uL (ref 3.87–5.11)
RDW: 12.2 % (ref 11.5–15.5)
WBC: 9.4 10*3/uL (ref 4.0–10.5)

## 2016-11-13 LAB — BASIC METABOLIC PANEL
Anion gap: 8 (ref 5–15)
BUN: 14 mg/dL (ref 6–20)
CHLORIDE: 102 mmol/L (ref 101–111)
CO2: 24 mmol/L (ref 22–32)
Calcium: 9 mg/dL (ref 8.9–10.3)
Creatinine, Ser: 0.6 mg/dL (ref 0.44–1.00)
GFR calc Af Amer: >60 mL/min (ref >60)
GFR calc non Af Amer: >60 mL/min (ref >60)
Glucose, Bld: 175 mg/dL — ABNORMAL HIGH (ref 65–99)
POTASSIUM: 3.4 mmol/L — AB (ref 3.5–5.1)
SODIUM: 134 mmol/L — AB (ref 135–145)

## 2016-11-13 NOTE — Pre-Procedure Instructions (Signed)
Dr. C. Jackson reviewed EKG, no new orders received at this time. 

## 2016-11-21 ENCOUNTER — Encounter (HOSPITAL_COMMUNITY)
Admission: RE | Disposition: A | Discharge status: Home / Self Care | Payer: Self-pay | Source: Ambulatory Visit | Attending: Obstetrics and Gynecology

## 2016-11-21 ENCOUNTER — Ambulatory Visit (HOSPITAL_COMMUNITY): Payer: BC Managed Care – PPO | Admitting: Anesthesiology

## 2016-11-21 ENCOUNTER — Ambulatory Visit (HOSPITAL_COMMUNITY)
Admission: RE | Admit: 2016-11-21 | Discharge: 2016-11-22 | Disposition: A | Discharge status: Home / Self Care | Payer: BC Managed Care – PPO | Source: Ambulatory Visit | Attending: Obstetrics and Gynecology | Admitting: Obstetrics and Gynecology

## 2016-11-21 ENCOUNTER — Encounter (HOSPITAL_COMMUNITY): Payer: Self-pay

## 2016-11-21 DIAGNOSIS — Z881 Allergy status to other antibiotic agents status: Secondary | ICD-10-CM | POA: Insufficient documentation

## 2016-11-21 DIAGNOSIS — I10 Essential (primary) hypertension: Secondary | ICD-10-CM | POA: Diagnosis not present

## 2016-11-21 DIAGNOSIS — N920 Excessive and frequent menstruation with regular cycle: Secondary | ICD-10-CM | POA: Insufficient documentation

## 2016-11-21 DIAGNOSIS — K429 Umbilical hernia without obstruction or gangrene: Secondary | ICD-10-CM | POA: Insufficient documentation

## 2016-11-21 DIAGNOSIS — J45909 Unspecified asthma, uncomplicated: Secondary | ICD-10-CM | POA: Insufficient documentation

## 2016-11-21 DIAGNOSIS — N736 Female pelvic peritoneal adhesions (postinfective): Secondary | ICD-10-CM | POA: Diagnosis not present

## 2016-11-21 DIAGNOSIS — N83201 Unspecified ovarian cyst, right side: Secondary | ICD-10-CM | POA: Diagnosis not present

## 2016-11-21 DIAGNOSIS — N72 Inflammatory disease of cervix uteri: Secondary | ICD-10-CM | POA: Diagnosis not present

## 2016-11-21 DIAGNOSIS — D251 Intramural leiomyoma of uterus: Secondary | ICD-10-CM | POA: Diagnosis not present

## 2016-11-21 DIAGNOSIS — K219 Gastro-esophageal reflux disease without esophagitis: Secondary | ICD-10-CM | POA: Diagnosis not present

## 2016-11-21 DIAGNOSIS — Z6830 Body mass index (BMI) 30.0-30.9, adult: Secondary | ICD-10-CM | POA: Diagnosis not present

## 2016-11-21 DIAGNOSIS — R102 Pelvic and perineal pain: Secondary | ICD-10-CM | POA: Insufficient documentation

## 2016-11-21 DIAGNOSIS — F419 Anxiety disorder, unspecified: Secondary | ICD-10-CM | POA: Insufficient documentation

## 2016-11-21 DIAGNOSIS — Z90710 Acquired absence of both cervix and uterus: Secondary | ICD-10-CM

## 2016-11-21 DIAGNOSIS — Z23 Encounter for immunization: Secondary | ICD-10-CM | POA: Diagnosis not present

## 2016-11-21 HISTORY — PX: ROBOTIC ASSISTED TOTAL HYSTERECTOMY WITH SALPINGECTOMY: SHX6679

## 2016-11-21 HISTORY — PX: LAPAROSCOPY: SHX197

## 2016-11-21 HISTORY — PX: LAPAROSCOPIC LYSIS OF ADHESIONS: SHX5905

## 2016-11-21 SURGERY — ROBOTIC ASSISTED TOTAL HYSTERECTOMY WITH SALPINGECTOMY
Anesthesia: General | Site: Abdomen

## 2016-11-21 MED ORDER — BUPIVACAINE LIPOSOME 1.3 % IJ SUSP
20.0000 mL | Freq: Once | INTRAMUSCULAR | Status: AC
  Administered 2016-11-21: 20 mL
  Filled 2016-11-21: qty 20

## 2016-11-21 MED ORDER — DEXAMETHASONE SODIUM PHOSPHATE 4 MG/ML IJ SOLN
INTRAMUSCULAR | Status: AC
  Filled 2016-11-21: qty 1

## 2016-11-21 MED ORDER — SODIUM CHLORIDE 0.9 % IJ SOLN
INTRAMUSCULAR | Status: AC
  Filled 2016-11-21: qty 10

## 2016-11-21 MED ORDER — KETOROLAC TROMETHAMINE 30 MG/ML IJ SOLN
INTRAMUSCULAR | Status: AC
  Filled 2016-11-21: qty 1

## 2016-11-21 MED ORDER — KETOROLAC TROMETHAMINE 30 MG/ML IJ SOLN
30.0000 mg | Freq: Four times a day (QID) | INTRAMUSCULAR | Status: DC
Start: 2016-11-21 — End: 2016-11-22

## 2016-11-21 MED ORDER — MEPERIDINE HCL 25 MG/ML IJ SOLN: 6.2500 mg | INTRAMUSCULAR | Status: DC | PRN

## 2016-11-21 MED ORDER — ROCURONIUM BROMIDE 100 MG/10ML IV SOLN
INTRAVENOUS | Status: DC | PRN
  Administered 2016-11-21: 10 mg via INTRAVENOUS
  Administered 2016-11-21: 50 mg via INTRAVENOUS
  Administered 2016-11-21 (×2): 30 mg via INTRAVENOUS
  Administered 2016-11-21: 20 mg via INTRAVENOUS

## 2016-11-21 MED ORDER — PANTOPRAZOLE SODIUM 40 MG PO TBEC
40.0000 mg | DELAYED_RELEASE_TABLET | Freq: Every day | ORAL | Status: DC
  Administered 2016-11-22: 40 mg via ORAL
  Filled 2016-11-21: qty 1

## 2016-11-21 MED ORDER — PROMETHAZINE HCL 25 MG/ML IJ SOLN: 6.2500 mg | INTRAMUSCULAR | Status: DC | PRN

## 2016-11-21 MED ORDER — ALBUTEROL SULFATE (2.5 MG/3ML) 0.083% IN NEBU: 2.5000 mg | INHALATION_SOLUTION | RESPIRATORY_TRACT | Status: DC | PRN

## 2016-11-21 MED ORDER — MIDAZOLAM HCL 2 MG/2ML IJ SOLN
INTRAMUSCULAR | Status: AC
  Filled 2016-11-21: qty 2

## 2016-11-21 MED ORDER — KETOROLAC TROMETHAMINE 30 MG/ML IJ SOLN
INTRAMUSCULAR | Status: DC | PRN
  Administered 2016-11-21: 30 mg via INTRAVENOUS

## 2016-11-21 MED ORDER — MENTHOL 3 MG MT LOZG: 1.0000 | LOZENGE | OROMUCOSAL | Status: DC | PRN

## 2016-11-21 MED ORDER — HYDROMORPHONE HCL 1 MG/ML IJ SOLN
0.2500 mg | INTRAMUSCULAR | Status: DC | PRN
  Administered 2016-11-21 (×2): 0.5 mg via INTRAVENOUS

## 2016-11-21 MED ORDER — ALPRAZOLAM 0.5 MG PO TABS: 0.5000 mg | ORAL_TABLET | Freq: Three times a day (TID) | ORAL | Status: DC | PRN

## 2016-11-21 MED ORDER — ONDANSETRON HCL 4 MG PO TABS: 4.0000 mg | ORAL_TABLET | Freq: Four times a day (QID) | ORAL | Status: DC | PRN

## 2016-11-21 MED ORDER — SODIUM CHLORIDE 0.9 % IJ SOLN
INTRAMUSCULAR | Status: AC
  Filled 2016-11-21: qty 40

## 2016-11-21 MED ORDER — ONDANSETRON HCL 4 MG/2ML IJ SOLN
INTRAMUSCULAR | Status: AC
  Filled 2016-11-21: qty 2

## 2016-11-21 MED ORDER — ALBUMIN HUMAN 5 % IV SOLN
INTRAVENOUS | Status: AC
  Filled 2016-11-21: qty 250

## 2016-11-21 MED ORDER — ONDANSETRON HCL 4 MG/2ML IJ SOLN
INTRAMUSCULAR | Status: DC | PRN
  Administered 2016-11-21: 4 mg via INTRAVENOUS

## 2016-11-21 MED ORDER — MIDAZOLAM HCL 2 MG/2ML IJ SOLN: 0.5000 mg | Freq: Once | INTRAMUSCULAR | Status: DC | PRN

## 2016-11-21 MED ORDER — LISINOPRIL-HYDROCHLOROTHIAZIDE 20-25 MG PO TABS: 1.0000 | ORAL_TABLET | Freq: Every day | ORAL | Status: DC

## 2016-11-21 MED ORDER — HYDROMORPHONE HCL 1 MG/ML IJ SOLN
0.2000 mg | INTRAMUSCULAR | Status: DC | PRN
  Administered 2016-11-21 (×2): 0.5 mg via INTRAVENOUS
  Filled 2016-11-21 (×2): qty 1

## 2016-11-21 MED ORDER — PHENYLEPHRINE 40 MCG/ML (10ML) SYRINGE FOR IV PUSH (FOR BLOOD PRESSURE SUPPORT)
PREFILLED_SYRINGE | INTRAVENOUS | Status: AC
Start: 2016-11-21 — End: 2016-11-21
  Filled 2016-11-21: qty 10

## 2016-11-21 MED ORDER — LIDOCAINE HCL (CARDIAC) 20 MG/ML IV SOLN
INTRAVENOUS | Status: AC
  Filled 2016-11-21: qty 5

## 2016-11-21 MED ORDER — CHLORHEXIDINE GLUCONATE CLOTH 2 % EX PADS
6.0000 | MEDICATED_PAD | Freq: Once | CUTANEOUS | Status: AC
  Administered 2016-11-21: 6 via TOPICAL

## 2016-11-21 MED ORDER — OXYCODONE-ACETAMINOPHEN 5-325 MG PO TABS
1.0000 | ORAL_TABLET | ORAL | Status: DC | PRN
  Administered 2016-11-21 – 2016-11-22 (×4): 1 via ORAL
  Filled 2016-11-21 (×4): qty 1

## 2016-11-21 MED ORDER — IBUPROFEN 800 MG PO TABS
800.0000 mg | ORAL_TABLET | Freq: Three times a day (TID) | ORAL | Status: DC | PRN
  Administered 2016-11-21 – 2016-11-22 (×2): 800 mg via ORAL
  Filled 2016-11-21 (×2): qty 1

## 2016-11-21 MED ORDER — SCOPOLAMINE 1 MG/3DAYS TD PT72
MEDICATED_PATCH | TRANSDERMAL | Status: AC
  Administered 2016-11-21: 1.5 mg via TRANSDERMAL
  Filled 2016-11-21: qty 1

## 2016-11-21 MED ORDER — SODIUM CHLORIDE 0.9 % IJ SOLN
INTRAMUSCULAR | Status: DC | PRN
  Administered 2016-11-21: 10 mL via INTRAVENOUS

## 2016-11-21 MED ORDER — PHENYLEPHRINE HCL 10 MG/ML IJ SOLN
INTRAMUSCULAR | Status: DC | PRN
  Administered 2016-11-21: 40 ug via INTRAVENOUS
  Administered 2016-11-21: 80 ug via INTRAVENOUS
  Administered 2016-11-21: 160 ug via INTRAVENOUS
  Administered 2016-11-21: 120 ug via INTRAVENOUS
  Administered 2016-11-21: 160 ug via INTRAVENOUS
  Administered 2016-11-21: 80 ug via INTRAVENOUS
  Administered 2016-11-21: 200 ug via INTRAVENOUS
  Administered 2016-11-21: 160 ug via INTRAVENOUS

## 2016-11-21 MED ORDER — ROCURONIUM BROMIDE 100 MG/10ML IV SOLN
INTRAVENOUS | Status: AC
  Filled 2016-11-21: qty 1

## 2016-11-21 MED ORDER — EPHEDRINE SULFATE 50 MG/ML IJ SOLN
INTRAMUSCULAR | Status: DC | PRN
  Administered 2016-11-21 (×2): 15 mg via INTRAVENOUS
  Administered 2016-11-21: 20 mg via INTRAVENOUS

## 2016-11-21 MED ORDER — CITALOPRAM HYDROBROMIDE 20 MG PO TABS
20.0000 mg | ORAL_TABLET | Freq: Every day | ORAL | Status: DC
  Administered 2016-11-22: 20 mg via ORAL
  Filled 2016-11-21: qty 1

## 2016-11-21 MED ORDER — PROPOFOL 10 MG/ML IV BOLUS
INTRAVENOUS | Status: AC
  Filled 2016-11-21: qty 20

## 2016-11-21 MED ORDER — PHENYLEPHRINE 40 MCG/ML (10ML) SYRINGE FOR IV PUSH (FOR BLOOD PRESSURE SUPPORT)
PREFILLED_SYRINGE | INTRAVENOUS | Status: AC
  Filled 2016-11-21: qty 10

## 2016-11-21 MED ORDER — FLUTICASONE PROPIONATE 50 MCG/ACT NA SUSP
2.0000 | Freq: Every day | NASAL | Status: DC
  Administered 2016-11-21: 2 via NASAL
  Filled 2016-11-21: qty 16

## 2016-11-21 MED ORDER — SUGAMMADEX SODIUM 200 MG/2ML IV SOLN
INTRAVENOUS | Status: DC | PRN
  Administered 2016-11-21: 177 mg via INTRAVENOUS

## 2016-11-21 MED ORDER — ROPIVACAINE HCL 5 MG/ML IJ SOLN
INTRAMUSCULAR | Status: AC
  Filled 2016-11-21: qty 30

## 2016-11-21 MED ORDER — MIDAZOLAM HCL 2 MG/2ML IJ SOLN
INTRAMUSCULAR | Status: DC | PRN
  Administered 2016-11-21: 1 mg via INTRAVENOUS

## 2016-11-21 MED ORDER — DEXAMETHASONE SODIUM PHOSPHATE 10 MG/ML IJ SOLN
INTRAMUSCULAR | Status: DC | PRN
  Administered 2016-11-21: 4 mg via INTRAVENOUS

## 2016-11-21 MED ORDER — HYDROCHLOROTHIAZIDE 25 MG PO TABS
25.0000 mg | ORAL_TABLET | Freq: Every day | ORAL | Status: DC
  Administered 2016-11-22: 25 mg via ORAL
  Filled 2016-11-21: qty 1

## 2016-11-21 MED ORDER — FENTANYL CITRATE (PF) 100 MCG/2ML IJ SOLN
INTRAMUSCULAR | Status: DC | PRN
  Administered 2016-11-21: 50 ug via INTRAVENOUS
  Administered 2016-11-21: 25 ug via INTRAVENOUS
  Administered 2016-11-21 (×2): 50 ug via INTRAVENOUS
  Administered 2016-11-21 (×2): 25 ug via INTRAVENOUS
  Administered 2016-11-21: 50 ug via INTRAVENOUS
  Administered 2016-11-21 (×2): 25 ug via INTRAVENOUS
  Administered 2016-11-21 (×2): 50 ug via INTRAVENOUS

## 2016-11-21 MED ORDER — FENTANYL CITRATE (PF) 250 MCG/5ML IJ SOLN
INTRAMUSCULAR | Status: AC
  Filled 2016-11-21: qty 5

## 2016-11-21 MED ORDER — BUPIVACAINE HCL (PF) 0.25 % IJ SOLN
INTRAMUSCULAR | Status: AC
  Filled 2016-11-21: qty 30

## 2016-11-21 MED ORDER — PROPOFOL 10 MG/ML IV BOLUS
INTRAVENOUS | Status: DC | PRN
Start: 2016-11-21 — End: 2016-11-21
  Administered 2016-11-21: 200 mg via INTRAVENOUS

## 2016-11-21 MED ORDER — CEFAZOLIN SODIUM-DEXTROSE 2-4 GM/100ML-% IV SOLN: 2.0000 g | INTRAVENOUS | Status: DC

## 2016-11-21 MED ORDER — EPHEDRINE 5 MG/ML INJ
INTRAVENOUS | Status: AC
  Filled 2016-11-21: qty 10

## 2016-11-21 MED ORDER — ONDANSETRON HCL 4 MG/2ML IJ SOLN: 4.0000 mg | Freq: Four times a day (QID) | INTRAMUSCULAR | Status: DC | PRN

## 2016-11-21 MED ORDER — KETOROLAC TROMETHAMINE 30 MG/ML IJ SOLN
30.0000 mg | Freq: Four times a day (QID) | INTRAMUSCULAR | Status: DC
  Administered 2016-11-21: 30 mg via INTRAVENOUS
  Filled 2016-11-21 (×2): qty 1

## 2016-11-21 MED ORDER — SCOPOLAMINE 1 MG/3DAYS TD PT72
1.0000 | MEDICATED_PATCH | Freq: Once | TRANSDERMAL | Status: DC
  Administered 2016-11-21: 1.5 mg via TRANSDERMAL

## 2016-11-21 MED ORDER — LIDOCAINE HCL (CARDIAC) 20 MG/ML IV SOLN
INTRAVENOUS | Status: DC | PRN
  Administered 2016-11-21: 30 mg via INTRAVENOUS
  Administered 2016-11-21: 70 mg via INTRAVENOUS

## 2016-11-21 MED ORDER — SIMETHICONE 80 MG PO CHEW: 80.0000 mg | CHEWABLE_TABLET | Freq: Four times a day (QID) | ORAL | Status: DC | PRN

## 2016-11-21 MED ORDER — BUPIVACAINE HCL (PF) 0.25 % IJ SOLN
INTRAMUSCULAR | Status: DC | PRN
  Administered 2016-11-21: 10 mL

## 2016-11-21 MED ORDER — DEXTROSE IN LACTATED RINGERS 5 % IV SOLN: INTRAVENOUS | Status: DC

## 2016-11-21 MED ORDER — LACTATED RINGERS IR SOLN
Status: DC | PRN
  Administered 2016-11-21: 3000 mL

## 2016-11-21 MED ORDER — LACTATED RINGERS IV SOLN
INTRAVENOUS | Status: DC
  Administered 2016-11-21 (×4): via INTRAVENOUS

## 2016-11-21 MED ORDER — PNEUMOCOCCAL VAC POLYVALENT 25 MCG/0.5ML IJ INJ
0.5000 mL | INJECTION | INTRAMUSCULAR | Status: AC
  Administered 2016-11-22: 0.5 mL via INTRAMUSCULAR
  Filled 2016-11-21: qty 0.5

## 2016-11-21 MED ORDER — HYDROMORPHONE HCL 1 MG/ML IJ SOLN
INTRAMUSCULAR | Status: AC
  Administered 2016-11-21: 0.5 mg via INTRAVENOUS
  Filled 2016-11-21: qty 1

## 2016-11-21 MED ORDER — ALBUMIN HUMAN 5 % IV SOLN
INTRAVENOUS | Status: DC | PRN
  Administered 2016-11-21: 12:00:00 via INTRAVENOUS

## 2016-11-21 MED ORDER — LISINOPRIL 20 MG PO TABS
20.0000 mg | ORAL_TABLET | Freq: Every day | ORAL | Status: DC
  Administered 2016-11-22: 20 mg via ORAL
  Filled 2016-11-21: qty 1

## 2016-11-21 MED ORDER — CEFAZOLIN SODIUM-DEXTROSE 2-4 GM/100ML-% IV SOLN
2.0000 g | INTRAVENOUS | Status: AC
  Administered 2016-11-21 (×2): 2 g via INTRAVENOUS

## 2016-11-21 SURGICAL SUPPLY — 60 items
APPLICATOR ARISTA FLEXITIP XL (MISCELLANEOUS) ×3 IMPLANT
BARRIER ADHS 3X4 INTERCEED (GAUZE/BANDAGES/DRESSINGS) IMPLANT
CATH FOLEY 3WAY  5CC 16FR (CATHETERS) ×2
CATH FOLEY 3WAY 5CC 16FR (CATHETERS) ×1 IMPLANT
CLOTH BEACON ORANGE TIMEOUT ST (SAFETY) ×3 IMPLANT
CONT PATH 16OZ SNAP LID 3702 (MISCELLANEOUS) ×3 IMPLANT
COVER BACK TABLE 60X90IN (DRAPES) ×6 IMPLANT
COVER TIP SHEARS 8 DVNC (MISCELLANEOUS) ×1 IMPLANT
COVER TIP SHEARS 8MM DA VINCI (MISCELLANEOUS) ×2
DECANTER SPIKE VIAL GLASS SM (MISCELLANEOUS) ×6 IMPLANT
DEFOGGER SCOPE WARMER CLEARIFY (MISCELLANEOUS) ×3 IMPLANT
DERMABOND ADVANCED (GAUZE/BANDAGES/DRESSINGS) ×2
DERMABOND ADVANCED .7 DNX12 (GAUZE/BANDAGES/DRESSINGS) ×1 IMPLANT
DURAPREP 26ML APPLICATOR (WOUND CARE) ×3 IMPLANT
ELECT REM PT RETURN 9FT ADLT (ELECTROSURGICAL) ×3
ELECTRODE REM PT RTRN 9FT ADLT (ELECTROSURGICAL) ×1 IMPLANT
GAUZE VASELINE 3X9 (GAUZE/BANDAGES/DRESSINGS) IMPLANT
GLOVE BIOGEL PI IND STRL 7.0 (GLOVE) ×5 IMPLANT
GLOVE BIOGEL PI INDICATOR 7.0 (GLOVE) ×10
GLOVE ECLIPSE 6.5 STRL STRAW (GLOVE) ×9 IMPLANT
HEMOSTAT ARISTA ABSORB 3G PWDR (MISCELLANEOUS) ×3 IMPLANT
KIT ACCESSORY DA VINCI DISP (KITS) ×2
KIT ACCESSORY DVNC DISP (KITS) ×1 IMPLANT
LEGGING LITHOTOMY PAIR STRL (DRAPES) ×3 IMPLANT
MANIPULATOR ADVINCU DEL 3.0 PL (MISCELLANEOUS) ×3 IMPLANT
NEEDLE INSUFFLATION 150MM (ENDOMECHANICALS) ×3 IMPLANT
OCCLUDER COLPOPNEUMO (BALLOONS) ×6 IMPLANT
PACK ROBOT WH (CUSTOM PROCEDURE TRAY) ×3 IMPLANT
PACK ROBOTIC GOWN (GOWN DISPOSABLE) ×3 IMPLANT
PACK TRENDGUARD 450 HYBRID PRO (MISCELLANEOUS) ×1 IMPLANT
PACK TRENDGUARD 600 HYBRD PROC (MISCELLANEOUS) IMPLANT
PAD PREP 24X48 CUFFED NSTRL (MISCELLANEOUS) ×3 IMPLANT
PROTECTOR NERVE ULNAR (MISCELLANEOUS) ×6 IMPLANT
SCISSORS LAP 5X35 DISP (ENDOMECHANICALS) ×3 IMPLANT
SET CYSTO W/LG BORE CLAMP LF (SET/KITS/TRAYS/PACK) IMPLANT
SET IRRIG TUBING LAPAROSCOPIC (IRRIGATION / IRRIGATOR) ×3 IMPLANT
SET TRI-LUMEN FLTR TB AIRSEAL (TUBING) ×3 IMPLANT
SLEEVE XCEL OPT CAN 5 100 (ENDOMECHANICALS) ×3 IMPLANT
SUT MNCRL AB 4-0 PS2 18 (SUTURE) ×12 IMPLANT
SUT VIC AB 0 CT1 36 (SUTURE) ×6 IMPLANT
SUT VIC AB 0 CT2 27 (SUTURE) ×3 IMPLANT
SUT VICRYL 0 UR6 27IN ABS (SUTURE) ×3 IMPLANT
SUT VLOC 180 0 9IN  GS21 (SUTURE) ×4
SUT VLOC 180 0 9IN GS21 (SUTURE) ×2 IMPLANT
SYR 50ML LL SCALE MARK (SYRINGE) ×3 IMPLANT
SYRINGE 10CC LL (SYRINGE) ×3 IMPLANT
TIP RUMI ORANGE 6.7MMX12CM (TIP) IMPLANT
TIP UTERINE 5.1X6CM LAV DISP (MISCELLANEOUS) ×3 IMPLANT
TIP UTERINE 6.7X10CM GRN DISP (MISCELLANEOUS) IMPLANT
TIP UTERINE 6.7X6CM WHT DISP (MISCELLANEOUS) ×3 IMPLANT
TIP UTERINE 6.7X8CM BLUE DISP (MISCELLANEOUS) ×3 IMPLANT
TOWEL OR 17X24 6PK STRL BLUE (TOWEL DISPOSABLE) ×9 IMPLANT
TRENDGUARD 450 HYBRID PRO PACK (MISCELLANEOUS) ×2
TRENDGUARD 600 HYBRID PROC PK (MISCELLANEOUS)
TROCAR DISP BLADELESS 8 DVNC (TROCAR) ×1 IMPLANT
TROCAR DISP BLADELESS 8MM (TROCAR) ×2
TROCAR PORT AIRSEAL 8X120 (TROCAR) ×3 IMPLANT
TROCAR XCEL NON-BLD 5MMX100MML (ENDOMECHANICALS) ×3 IMPLANT
TROCAR Z-THREAD 12X150 (TROCAR) ×3 IMPLANT
WATER STERILE IRR 1000ML POUR (IV SOLUTION) ×3 IMPLANT

## 2016-11-21 NOTE — Anesthesia Procedure Notes (Signed)
Procedure Name: Intubation Date/Time: 11/21/2016 7:28 AM Performed by: Tobin Chad Pre-anesthesia Checklist: Emergency Drugs available, Patient identified, Suction available and Patient being monitored Patient Re-evaluated:Patient Re-evaluated prior to inductionOxygen Delivery Method: Circle system utilized and Simple face mask Preoxygenation: Pre-oxygenation with 100% oxygen Intubation Type: IV induction and Inhalational induction with existing ETT Ventilation: Mask ventilation without difficulty Laryngoscope Size: Mac and 3 Grade View: Grade II Tube type: Oral Tube size: 7.0 mm Number of attempts: 1 Airway Equipment and Method: Stylet Placement Confirmation: ETT inserted through vocal cords under direct vision,  positive ETCO2 and breath sounds checked- equal and bilateral Secured at: 23 (com) cm Tube secured with: Tape Dental Injury: Teeth and Oropharynx as per pre-operative assessment

## 2016-11-21 NOTE — OR Nursing (Signed)
Dr. Lemar Livings was called by Therese Sarah RN

## 2016-11-21 NOTE — OR Nursing (Signed)
Dr Lemar Livings was called at 0940 and notified we were ready for him to come do his part of the scheduled surgery. He was in surgery and stated he would have to reschedule this patients procedure. He could not come now

## 2016-11-21 NOTE — OR Nursing (Signed)
Dr Rosendo Gros called at 1001 and stated he would be able to come do his part of procedure in 15 minutes

## 2016-11-21 NOTE — Anesthesia Preprocedure Evaluation (Signed)
Anesthesia Evaluation  Patient identified by MRN, date of birth, ID band Patient awake    Reviewed: Allergy & Precautions, NPO status , Patient's Chart, lab work & pertinent test results  History of Anesthesia Complications Negative for: history of anesthetic complications  Airway Mallampati: I  TM Distance: >3 FB Neck ROM: Full    Dental  (+) Dental Advisory Given   Pulmonary asthma (rarely needs rescue inhaler) ,    breath sounds clear to auscultation       Cardiovascular hypertension, Pt. on medications (-) angina Rhythm:Regular Rate:Normal     Neuro/Psych negative neurological ROS     GI/Hepatic Neg liver ROS, GERD  Controlled,  Endo/Other  Morbid obesity  Renal/GU negative Renal ROS     Musculoskeletal   Abdominal (+) + obese,   Peds  Hematology negative hematology ROS (+)   Anesthesia Other Findings   Reproductive/Obstetrics                             Anesthesia Physical Anesthesia Plan  ASA: II  Anesthesia Plan: General   Post-op Pain Management:    Induction: Intravenous  Airway Management Planned: Oral ETT  Additional Equipment:   Intra-op Plan:   Post-operative Plan:   Informed Consent: I have reviewed the patients History and Physical, chart, labs and discussed the procedure including the risks, benefits and alternatives for the proposed anesthesia with the patient or authorized representative who has indicated his/her understanding and acceptance.   Dental advisory given  Plan Discussed with: CRNA and Surgeon  Anesthesia Plan Comments: (Plan routine monitors, GETA)        Anesthesia Quick Evaluation

## 2016-11-21 NOTE — Transfer of Care (Signed)
Immediate Anesthesia Transfer of Care Note  Patient: Stacy Hull  Procedure(s) Performed: Procedure(s): ROBOTIC ASSISTED TOTAL HYSTERECTOMY WITH  EXTENSIVE LYSIS OF ADHESIONS (N/A) EXTENSIVE LAPAROSCOPIC LYSIS OF ADHESIONS (N/A) LAPAROSCOPY DIAGNOSTIC, LAPAROSCOPIC REMOVAL OF FOREIGN BODY (N/A)  Patient Location: PACU  Anesthesia Type:General  Level of Consciousness: awake, alert , oriented and patient cooperative  Airway & Oxygen Therapy: Patient Spontanous Breathing and Patient connected to nasal cannula oxygen  Post-op Assessment: Report given to RN and Post -op Vital signs reviewed and stable  Post vital signs: Reviewed and stable  Last Vitals:  Vitals:   11/21/16 0617  BP: 129/85  Pulse: (!) 110  Resp: 18  Temp: 36.8 C    Last Pain:  Vitals:   11/21/16 0617  TempSrc: Oral      Patients Stated Pain Goal: 3 ([id number])  Complications: No apparent anesthesia complications

## 2016-11-21 NOTE — Anesthesia Postprocedure Evaluation (Signed)
Anesthesia Post Note  Patient: Stacy Hull  Procedure(s) Performed: Procedure(s) (LRB): ROBOTIC ASSISTED TOTAL HYSTERECTOMY WITH  EXTENSIVE LYSIS OF ADHESIONS (N/A) EXTENSIVE LAPAROSCOPIC LYSIS OF ADHESIONS (N/A) LAPAROSCOPY DIAGNOSTIC, LAPAROSCOPIC REMOVAL OF FOREIGN BODY (N/A)  Patient location during evaluation: [facility] Anesthesia Type: General Level of consciousness: awake and alert Pain management: pain level controlled Vital Signs Assessment: post-procedure vital signs reviewed and stable Respiratory status: spontaneous breathing, nonlabored ventilation and patient connected to nasal cannula oxygen Cardiovascular status: stable Postop Assessment: adequate PO intake and no signs of nausea or vomiting Anesthetic complications: no        Last Vitals:  Vitals:   11/21/16 1410 11/21/16 1530  BP: 116/63 115/65  Pulse: (!) 108 (!) 107  Resp: 18 18  Temp: 36.6 C 36.7 C    Last Pain:  Vitals:   11/21/16 1530  TempSrc: Oral  PainSc:    Pain Goal: Patients Stated Pain Goal: 3 (11/21/16 1415)               France Ravens Hristova

## 2016-11-21 NOTE — Brief Op Note (Signed)
11/21/2016  10:17 AM  PATIENT:  Stacy Hull  45 y.o. female  PRE-OPERATIVE DIAGNOSIS:  Pelvic Pain, Menorrhagia, Previous Cesarean Section, hx endometrial ablation  POST-OPERATIVE DIAGNOSIS:  Pelvic Pain, Menorrhagia, Previous Cesarean Section, abdominal adhesions  PROCEDURE:  Procedure(s): ROBOTIC ASSISTED TOTAL HYSTERECTOMY WITH  WXTENSIVE LYSIS OF ADHESIONS FOR ! HOUR (N/A) EXTENSIVE LAPAROSCOPIC LYSIS OF ADHESIONS (N/A)  SURGEON:  Surgeon(s) and Role:    * Servando Salina, MD - Primary  PHYSICIAN ASSISTANT:   ASSISTANTS: Laury Deep, CNM   ANESTHESIA:   general Findings. Umbilical hernia mesh.extensive adhesions to anterior abdominal wall and hernia mesh.  Nl ovaries, uterus with anterior adhesions, surgical absent tubes. Nl ureters, nl liver edge EBL:  Total I/O In: 1425 [I.V.:1425] Out: 500 [Urine:450; Blood:50]  BLOOD ADMINISTERED:none  DRAINS: none   LOCAL MEDICATIONS USED:  MARCAINE     SPECIMEN:  Source of Specimen:  uterus with cevix  DISPOSITION OF SPECIMEN:  PATHOLOGY  COUNTS:  YES  TOURNIQUET:  * No tourniquets in log *  DICTATION: .Other Dictation: Dictation Number [id number]  PLAN OF CARE: Admit for overnight observation  PATIENT DISPOSITION:  PACU - hemodynamically stable.   Delay start of Pharmacological VTE agent (>24hrs) due to surgical blood loss or risk of bleeding: no

## 2016-11-21 NOTE — Anesthesia Postprocedure Evaluation (Signed)
Anesthesia Post Note  Patient: Stacy Hull  Procedure(s) Performed: Procedure(s) (LRB): ROBOTIC ASSISTED TOTAL HYSTERECTOMY WITH  EXTENSIVE LYSIS OF ADHESIONS (N/A) EXTENSIVE LAPAROSCOPIC LYSIS OF ADHESIONS (N/A) LAPAROSCOPY DIAGNOSTIC, LAPAROSCOPIC REMOVAL OF FOREIGN BODY (N/A)  Patient location during evaluation: PACU Anesthesia Type: General Level of consciousness: awake and alert, oriented and patient cooperative Pain management: pain level controlled Vital Signs Assessment: post-procedure vital signs reviewed and stable Respiratory status: spontaneous breathing, nonlabored ventilation and respiratory function stable Cardiovascular status: blood pressure returned to baseline and stable Postop Assessment: no signs of nausea or vomiting Anesthetic complications: no        Last Vitals:  Vitals:   11/21/16 1345 11/21/16 1410  BP: 118/60 116/63  Pulse: (!) 108 (!) 108  Resp: 15 18  Temp: 36.7 C 36.6 C    Last Pain:  Vitals:   11/21/16 1415  TempSrc:   PainSc: (P) 2    Pain Goal: Patients Stated Pain Goal: (P) 3 (11/21/16 1415)               Caylin Raby,E. Mohmed Farver

## 2016-11-21 NOTE — Addendum Note (Signed)
Addendum  created 11/21/16 1549 by Hewitt Blade, CRNA   Sign clinical note

## 2016-11-21 NOTE — Op Note (Signed)
11/21/2016  12:00 PM  PATIENT:  Stacy Hull  45 y.o. female  PRE-OPERATIVE DIAGNOSIS:  Abdominal wall pain  POST-OPERATIVE DIAGNOSIS: same  PROCEDURE:  Procedure(s):  LAPAROSCOPY DIAGNOSTIC, LAPAROSCOPIC REMOVAL OF FOREIGN BODY (N/A)  SURGEON:  Surgeon(s) and Role: Panel 1:  Ralene Ok, MD - Primary    Panel 2:    * Servando Salina, MD - Primary    * Servando Salina, MD - Primary   ANESTHESIA:   local and general  EBL:  Total I/O In: 2750 [I.V.:2500; IV Piggyback:250] Out: 500 [Urine:450; Blood:50]  BLOOD ADMINISTERED:none  DRAINS: none   LOCAL MEDICATIONS USED:  OTHER Exparil   SPECIMEN:  No Specimen  DISPOSITION OF SPECIMEN:  N/A  COUNTS:  YES  TOURNIQUET:  * No tourniquets in log *  DICTATION: .Dragon Dictation  Indications for procedure: Patient is a 45 year old female with a history of a laparotomy, ventral hernia repair with mesh. Patient has significant pain postoperatively several months after surgery. He states this was approximately 7 to 8:00 position inferior lateral to the umbilicus. Patient was to undergo hysterectomy and requested diagnostic laparoscopy for possible nerve entrapment by suture.  Details of procedure: It should be noted that Dr. Garwin Brothers initially proceeded with hysterectomy. She will dictate that under separate cover.  Upon my arrival the robot was docked. The robot was then undocked. I proceeded with a 5 mm 30 scope to visualize the anterior abdominal wall. The mesh was visualized and seen to be well incorporated to the abdominal wall.  There appeared to be no recurrent hernias at the umbilicus. At this time I proceeded toBluntly and sharply dissect away the inferior edge at the 6:00 position of the mesh.At this time I was easily able to find a Prolene stitch. This was removed. I proceeded to dissect away the 9:00 position of the edge of the mesh. I was able to find a stitch in this area. I then proceeded to remove  the cast centimeter rim of mesh from the anterior abdominal wall from the 10:00 position to the 7:00 position in search of a Prolene stitch. However there is not one that could be found.  At this time and made a 1 cm incision over the area of the 9:00 position of the abdominal wall. Blunt dissection was taken down to the anterior fascia. Through palpation and with direct vision I was unable to visualize or remove a stitch in this area.  At this time 20 mL of Exparil intensity saline were mixed. Then proceeded to place his pre-peritoneally along the 9:00 to 6:00 position of the anterior abdominal wall at the edge of the mesh.  At this time Dr. Garwin Brothers scrubbed back into the case and proceeded to remove her port site to close the anterior abdominal wall.   PLAN OF CARE: Per Dr. Garwin Brothers  PATIENT DISPOSITION:  PACU - hemodynamically stable.   Delay start of Pharmacological VTE agent (>24hrs) due to surgical blood loss or risk of bleeding: not applicable

## 2016-11-22 DIAGNOSIS — N920 Excessive and frequent menstruation with regular cycle: Secondary | ICD-10-CM | POA: Diagnosis not present

## 2016-11-22 LAB — BASIC METABOLIC PANEL
ANION GAP: 7 (ref 5–15)
BUN: 10 mg/dL (ref 6–20)
CALCIUM: 8.3 mg/dL — AB (ref 8.9–10.3)
CO2: 27 mmol/L (ref 22–32)
CREATININE: 0.62 mg/dL (ref 0.44–1.00)
Chloride: 101 mmol/L (ref 101–111)
GFR calc Af Amer: >60 mL/min (ref >60)
GLUCOSE: 126 mg/dL — AB (ref 65–99)
Potassium: 3.8 mmol/L (ref 3.5–5.1)
Sodium: 135 mmol/L (ref 135–145)

## 2016-11-22 LAB — CBC
HCT: 37.1 % (ref 36.0–46.0)
Hemoglobin: 12.4 g/dL (ref 12.0–15.0)
MCH: 32.3 pg (ref 26.0–34.0)
MCHC: 33.4 g/dL (ref 30.0–36.0)
MCV: 96.6 fL (ref 78.0–100.0)
PLATELETS: 228 10*3/uL (ref 150–400)
RBC: 3.84 MIL/uL — ABNORMAL LOW (ref 3.87–5.11)
RDW: 12.9 % (ref 11.5–15.5)
WBC: 11.2 10*3/uL — AB (ref 4.0–10.5)

## 2016-11-22 MED ORDER — OXYCODONE-ACETAMINOPHEN 5-325 MG PO TABS: 1.0000 | ORAL_TABLET | ORAL | 0 refills | Status: DC | PRN

## 2016-11-22 MED ORDER — IBUPROFEN 800 MG PO TABS: 800.0000 mg | ORAL_TABLET | Freq: Three times a day (TID) | ORAL | 0 refills | Status: DC | PRN

## 2016-11-22 MED ORDER — DIPHENHYDRAMINE HCL 25 MG PO CAPS
ORAL_CAPSULE | ORAL | Status: AC
  Filled 2016-11-22: qty 1

## 2016-11-22 MED ORDER — DIPHENHYDRAMINE HCL 25 MG PO CAPS
25.0000 mg | ORAL_CAPSULE | Freq: Four times a day (QID) | ORAL | Status: DC | PRN
  Administered 2016-11-22 (×2): 25 mg via ORAL
  Filled 2016-11-22: qty 1

## 2016-11-22 NOTE — Progress Notes (Signed)
Subjective: Patient reports tolerating PO, + flatus and no problems voiding. Reviewed intraoperative findings with pt    Objective: I have reviewed patient's vital signs.  vital signs, intake and output and labs. Vitals:   11/22/16 0331 11/22/16 0752  BP: 111/65 121/76  Pulse: 86 87  Resp: 16 18  Temp: 97.9 F (36.6 C) 98.2 F (36.8 C)   I/O last 3 completed shifts: In: 8276.5 [P.O.:3060; I.V.:4966.5; IV Piggyback:250] Out: 4600 [Urine:4550; Blood:50] No intake/output data recorded.  Lab Results  Component Value Date   WBC 11.2 (H) 11/22/2016   HGB 12.4 11/22/2016   HCT 37.1 11/22/2016   MCV 96.6 11/22/2016   PLT 228 11/22/2016   Lab Results  Component Value Date   CREATININE 0.62 11/22/2016    EXAM General: alert, cooperative and no distress Resp: clear to auscultation bilaterally Cardio: regular rate and rhythm, S1, S2 normal, no murmur, click, rub or gallop GI: incision: clean, dry, intact and mild erythema around visible incision ? allergic rxn Extremities: no edema, redness or tenderness in the calves or thighs Vaginal Bleeding: none( no pad) Back: no CVAT  Assessment: s/p Procedure(s): ROBOTIC ASSISTED TOTAL HYSTERECTOMY WITH  EXTENSIVE LYSIS OF ADHESIONS EXTENSIVE LAPAROSCOPIC LYSIS OF ADHESIONS LAPAROSCOPY DIAGNOSTIC, LAPAROSCOPIC REMOVAL OF FOREIGN BODY: stable, progressing well and tolerating diet 2) chronic HTN Plan: Encourage ambulation Advance to PO medication Discontinue IV fluids Discharge home  D/c instructions reviewed. Script: percocet, motrin Cont BP meds  LOS: 0 days    Janard Culp A, MD 11/22/2016 10:48 AM    11/22/2016, 10:48 AM

## 2016-11-22 NOTE — Progress Notes (Signed)
1 Day Post-Op  Subjective: Pt doing well this AM Some abd soreness  Objective: Vital signs in last 24 hours: Temp:  [97.9 F (36.6 C)-99 F (37.2 C)] 98.2 F (36.8 C) (12/23 0752) Pulse Rate:  [86-120] 87 (12/23 0752) Resp:  [15-20] 18 (12/23 0752) BP: (105-127)/(59-78) 121/76 (12/23 0752) SpO2:  [97 %-100 %] 100 % (12/23 0752) Weight:  [88.5 kg (195 lb)] 88.5 kg (195 lb) (12/22 1530) Last BM Date: 11/21/16  Intake/Output from previous day: 12/22 0701 - 12/23 0700 In: 8276.5 [P.O.:3060; I.V.:4966.5; IV Piggyback:250] Out: 4600 [Urine:4550; Blood:50] Intake/Output this shift: No intake/output data recorded.  General appearance: alert and cooperative GI: soft, non-tender; bowel sounds normal; no masses,  no organomegaly and incisions/c/d/i  Lab Results:   Recent Labs  11/22/16 0513  WBC 11.2*  HGB 12.4  HCT 37.1  PLT 228   BMET  Recent Labs  11/22/16 0513  NA 135  K 3.8  CL 101  CO2 27  GLUCOSE 126*  BUN 10  CREATININE 0.62  CALCIUM 8.3*   PT/INR No results for input(s): LABPROT, INR in the last 72 hours. ABG No results for input(s): PHART, HCO3 in the last 72 hours.  Invalid input(s): PCO2, PO2  Studies/Results: No results found.  Anti-infectives: Anti-infectives    Start     Dose/Rate Route Frequency Ordered Stop   11/21/16 0556  ceFAZolin (ANCEF) IVPB 2g/100 mL premix  Status:  Discontinued     2 g 200 mL/hr over 30 Minutes Intravenous On call to O.R. 11/21/16 3532 11/21/16 0558   11/21/16 0555  ceFAZolin (ANCEF) IVPB 2g/100 mL premix     2 g 200 mL/hr over 30 Minutes Intravenous On call to O.R. 11/21/16 0555 11/21/16 1115      Assessment/Plan: s/p Procedure(s): ROBOTIC ASSISTED TOTAL HYSTERECTOMY WITH  EXTENSIVE LYSIS OF ADHESIONS (N/A) EXTENSIVE LAPAROSCOPIC LYSIS OF ADHESIONS (N/A) LAPAROSCOPY DIAGNOSTIC, LAPAROSCOPIC REMOVAL OF FOREIGN BODY (N/A) Doing well Plans for home today F/u entered in computer x 2 weeks  LOS: 0 days     Rosario Jacks., [facility] 11/22/2016

## 2016-11-22 NOTE — Discharge Summary (Signed)
Physician Discharge Summary  Patient ID: Stacy Hull MRN: [id number] DOB/AGE: 1971/10/10 45 y.o.  Admit date: 11/21/2016 Discharge date: 11/22/2016  Admission Diagnoses: menorrhagia with regular cycles, hx endometrial ablation, previous cesarean section, Abdominal pain, hx umbilical hernia repair with mesh  Discharge Diagnoses: same, extensive anterior abdominal wall adhesions Active Problems:   S/P hysterectomy chronic hypertension  Discharged Condition: stable  Hospital Course: pt was admitted to [facility]. She underwent davinci robotic total hysterectomy, extensive LOA, Laparoscopic removal of foreign body(Dr Rosendo Gros), . Uncomplicated postoperative course.   Consults: None  Significant Diagnostic Studies: labs:  CBC    Component Value Date/Time   WBC 11.2 (H) 11/22/2016 0513   RBC 3.84 (L) 11/22/2016 0513   HGB 12.4 11/22/2016 0513   HCT 37.1 11/22/2016 0513   PLT 228 11/22/2016 0513   MCV 96.6 11/22/2016 0513   MCV 92.0 04/23/2016 1156   MCH 32.3 11/22/2016 0513   MCHC 33.4 11/22/2016 0513   RDW 12.9 11/22/2016 0513   Lab Results  Component Value Date   CREATININE 0.62 11/22/2016   CREATININE 0.60 11/13/2016   CREATININE 0.51 04/23/2016    Treatments: surgery: davinci robotic total hysterectomy, extensive LOA , laparoscopic removal of  Foreign body  Discharge Exam: Blood pressure 121/76, pulse 87, temperature 98.2 F (36.8 C), temperature source Oral, resp. rate 18, height 5' 7"  (1.702 m), weight 88.5 kg (195 lb), last menstrual period 11/03/2016, SpO2 100 %. General appearance: alert, cooperative and no distress Back: no tenderness to percussion or palpation Resp: clear to auscultation bilaterally Cardio: regular rate and rhythm, S1, S2 normal, no murmur, click, rub or gallop GI: soft, non-tender; bowel sounds normal; no masses,  no organomegaly Pelvic: deferred Extremities: no edema, redness or tenderness in the calves or thighs Incision/Wound:well  approximated, clean dry intact sl erythema around all visible ? Allergic rxn  Disposition: 01-Home or Self Care  Discharge Instructions    Call MD for:  persistant nausea and vomiting    Complete by:  As directed    Call MD for:  redness, tenderness, or signs of infection (pain, swelling, redness, odor or green/yellow discharge around incision site)    Complete by:  As directed    Call MD for:  temperature >100.4    Complete by:  As directed    Diet - low sodium heart healthy    Complete by:  As directed    Discharge instructions    Complete by:  As directed    Call if temperature greater than equal to 100.4, nothing per vagina for 4-6 weeks or severe nausea vomiting, increased incisional pain , drainage or redness in the incision site, no straining with bowel movements, showers no bath   Discharge patient    Complete by:  As directed    Increase activity slowly    Complete by:  As directed      Allergies as of 11/22/2016      Reactions   Erythromycin Other (See Comments)   G I upset.      Medication List    TAKE these medications   albuterol 108 (90 Base) MCG/ACT inhaler Commonly known as:  PROVENTIL HFA;VENTOLIN HFA Inhale 1-2 puffs into the lungs every 4 (four) hours as needed for wheezing or shortness of breath.   ALPRAZolam 0.5 MG tablet Commonly known as:  XANAX Take 1 tablet (0.5 mg total) by mouth 3 (three) times daily as needed for anxiety.   benzonatate 200 MG capsule Commonly known as:  TESSALON Take  1 capsule (200 mg total) by mouth 3 (three) times daily as needed.   citalopram 20 MG tablet Commonly known as:  CELEXA Take 1 tablet (20 mg total) by mouth daily.   cyclobenzaprine 10 MG tablet Commonly known as:  FLEXERIL TAKE 1 TABLET(10 MG) BY MOUTH THREE TIMES DAILY AS NEEDED FOR MUSCLE SPASMS   fluticasone 50 MCG/ACT nasal spray Commonly known as:  FLONASE Place 2 sprays into both nostrils at bedtime.   HYDROcodone-acetaminophen 5-325 MG  tablet Commonly known as:  NORCO/VICODIN Take 1-2 tablets by mouth every 4 (four) hours as needed for moderate pain.   ibuprofen 800 MG tablet Commonly known as:  ADVIL,MOTRIN Take 1 tablet (800 mg total) by mouth every 8 (eight) hours as needed (mild pain).   lisinopril-hydrochlorothiazide 20-25 MG tablet Commonly known as:  PRINZIDE,ZESTORETIC Take 1 tablet by mouth daily.   multivitamin tablet Take 1 tablet by mouth daily.   oxyCODONE-acetaminophen 5-325 MG tablet Commonly known as:  PERCOCET/ROXICET Take 1-2 tablets by mouth every 4 (four) hours as needed for severe pain (moderate to severe pain (when tolerating fluids)).   promethazine-codeine 6.25-10 MG/5ML syrup Commonly known as:  PHENERGAN with CODEINE Take 10 mLs by mouth every 4 (four) hours as needed for cough.   Vitamin D (Ergocalciferol) 50000 units Caps capsule Commonly known as:  DRISDOL Take 1 capsule (50,000 Units total) by mouth every 7 (seven) days.      Follow-up Information    Reyes Ivan, MD. Schedule an appointment as soon as possible for a visit in 2 week(s).   Specialty:  General Surgery Why:  For wound re-check Contact information: [address] [place] [address] [phone]        Margean Korell A, MD Follow up in 2 week(s).   Specialty:  Obstetrics and Gynecology Contact information: [address] [place] [address] [phone]           Signed: Servando Salina A 11/22/2016, 10:47 AM

## 2016-11-25 ENCOUNTER — Encounter (HOSPITAL_COMMUNITY): Payer: Self-pay | Admitting: Obstetrics and Gynecology

## 2016-11-25 NOTE — Op Note (Signed)
NAME:  Stacy Hull, Stacy Hull            ACCOUNT NO.:  [id number]  MEDICAL RECORD NO.:  [id number]  LOCATION:                                 FACILITY:  PHYSICIAN:  Servando Salina, M.D.DATE OF BIRTH:  Apr 13, 1971  DATE OF PROCEDURE:  11/21/2016 DATE OF DISCHARGE:  11/22/2016                              OPERATIVE REPORT   PREOPERATIVE DIAGNOSES: 1. Menorrhagia with regular cycles. 2. Previous cesarean section. 3. Pelvic pain. 4. History of endometrial ablation.  PROCEDURE:  Robotic-assisted total hysterectomy with extensive lyses of adhesions.  POSTOPERATIVE DIAGNOSES: 1. Menorrhagia with regular cycles. 2. Previous cesarean section. 3. Pelvic pain. 4. History of endometrial ablation. 5. Abdominopelvic anterior wall adhesions.  ANESTHESIA:  General.  SURGEON:  Servando Salina, M.D.  ASSISTANT:  Laury Deep, CNM  DESCRIPTION OF PROCEDURE:  Under adequate general anesthesia, the patient was placed in a dorsal lithotomy position.  She was positioned for robotic surgery.  Examination under anesthesia revealed anteverted uterus.  No adnexal masses could be appreciated.  The patient was sterilely prepped and draped in the usual fashion.  A 3-way Foley catheter was sterilely placed.  Pyridium has been given to the patient. Therefore, her urine had orange color to it.  The patient had a narrow vagina and retractors anteriorly and posteriorly were placed in order to visualize the cervix. The anterior and posterior lip of the cervix had 0 Vicryl figure-of-eight sutures times two placed. The uterus sounded to 8 cm.  A small RUMI cup with a #8 uterine manipulator was initially placed.  However, the balloon would not distend.  It was then changed to #6 manipulator which also would not facilitate placement in the uterine cavity.  At that point, the Advincula uterine manipulator was then inserted and placed without difficulty using a small RUMI cup.  Attention was then turned to  the abdomen.  The patient has had a prior robotic surgery and those sites there were available utilized were noticed.  Review of operative report from the prior surgery had revealed the patient who had an umbilical hernia repair with mesh, had clear area infraumbilical.  Therefore, 0.99% Marcaine was injected at the previous infraumbilical site.  An incision was then made through the prior scar and carried down to rectus fascia. However, as going further down it was then noted that the small piece of the mesh could be seen, the procedure at that point was aborted at that location.  Using Palmer's point, a small incision was made.  A 5 mm port was then placed in the left upper quadrant.  Insufflation was started and the 5 mm scope was then utilized to inspect the abdomen and pelvis. On entering, it was then noted that the patient had multiple sites of adhesions along the anterior abdominal wall and including the umbilical hernia mesh site.  This was going to limit the ability to view the pelvis and to do the surgery.  Outlining the outer and upper  borders of the umbilical mesh, a supraumbilical mid upper abdomen incision was made and with the 5 mm port being placed, hot scissors then being utilized, careful lysis of adhesions of all the adhesions including the upper part towards the liver was  performed.  This took over a little an hour of careful dissection to gently bring down all the adhesions that had been present.  Once that was done, the 5 mm port supraumbilical site incision was extended and a 12 mm trocar was then inserted into the abdomen under direct visualization.  Robotic camera was then placed.  The patient was then positioned in Trendelenburg position.  Manipulation of the uterus showed that you could now see the pelvis, the upper abdomen, normal liver edge, the outer borders of the umbilical hernia, as well as the surgical absence of the tubes.  Both ovaries being normal  with the right one that had a small cyst and uterus that was mobile.  The Palmer port site incision was extended and a robotic 8 mm trocar was placed under direct visualization in the left lower quadrant.  Robotic port site was also placed.  Air seal was placed in the right lower quadrant and in between that another robotic site was placed.  Once this was done, the robot was docked to the patient's left and in arm #1, the monopolar scissors; arm #2, the PK dissector; and arm #3, the ProGrasp.  I then went to the surgical console.  At the surgical console, both ureters were identified peristalsing well.  Procedure was started on the left side.  The left retroperitoneal space was opened. The left utero-ovarian ligament was serially clamped, cauterized, and then cut.  The posterior leaf of the broad ligament was then opened and brought down.  The left round ligament was serially clamped, cauterized, and cut.  The incision was then carried anteriorly where there were some adhesions from the prior cesarean section.  Once the anterior leaf was opened, it was carried around to the vesicouterine peritoneum.  That was then carefully and sharply dissected off the lower uterine segment and displaced inferiorly.  The uterine vessels were then isolated and visualized.  They were then serially clamped, cauterized, and cut. Attention was turned to the opposite side where a similar procedure was then performed with further opening the vesicouterine peritoneum on the right, isolating the vessels and serially clamped and cauterized.  The vaginal occluder was then insufflated.  The circumferential incision was then made along the upper part of the RUMI cup and the uterus was then severed from its vaginal attachment.  The uterus was then removed.  Once the uterus was out, the instruments were replaced in arm #1 and arm #2 respectively using the large Mega Suture needle driver as well as the long tip forceps.   The ProGrasp was replaced with PK dissector.  A 0 Vicryl figure-of-eight sutures x2 were placed in the posterior vaginal cuff.  The vaginal cuff was then closed with 0 V-Loc suture x2.  The pelvis was irrigated and suctioned of debris.  Good hemostasis was noted.  Dr. Rosendo Gros then came in to do his foreign body removal.  Please see his dictated operative notes.  He made an additional incision to the right of the umbilical site.  Once his procedure was completed, the robot which had been undocked for his procedure remained undocked.  The pelvis was once again irrigated and suctioned.  Panoramic inspection was done.  Potato starch was placed overlying the vaginal cuff and the robotic port sites were removed.  The abdomen was deflated.  The incisions were closed with 0 Vicryl figure-of-eight suture for my supraumbilical site.  Again, the incisions were all closed with 4-0 Monocryl subcuticular closure.  The vaginal cuff  was inspected twice including digital palpation with good approximation noted.  SPECIMEN:  Uterus with cervix, sent to Pathology.  ESTIMATED BLOOD LOSS:  50 mL.  INTRAOPERATIVE FLUIDS:  2100 mL.  URINE OUTPUT:  500 mL.  SPONGE AND INSTRUMENT COUNT:  Counts x2 was correct.  COMPLICATION:  None.  DISPOSITION:  The patient tolerated the procedure well, was transferred to recovery in stable condition.     Servando Salina, M.D.   ______________________________ Servando Salina, M.D.    [place]/MEDQ  D:  11/22/2016  T:  11/22/2016  Job:  [id number]

## 2016-12-14 ENCOUNTER — Inpatient Hospital Stay (HOSPITAL_COMMUNITY): Payer: BC Managed Care – PPO | Admitting: Anesthesiology

## 2016-12-14 ENCOUNTER — Encounter (HOSPITAL_COMMUNITY): Payer: Self-pay | Admitting: *Deleted

## 2016-12-14 ENCOUNTER — Ambulatory Visit (HOSPITAL_COMMUNITY)
Admission: AD | Admit: 2016-12-14 | Discharge: 2016-12-15 | Disposition: A | Discharge status: Home / Self Care | Payer: BC Managed Care – PPO | Source: Ambulatory Visit | Attending: Obstetrics & Gynecology | Admitting: Obstetrics & Gynecology

## 2016-12-14 ENCOUNTER — Encounter (HOSPITAL_COMMUNITY)
Admission: AD | Disposition: A | Discharge status: Home / Self Care | Payer: Self-pay | Source: Ambulatory Visit | Attending: Obstetrics & Gynecology

## 2016-12-14 DIAGNOSIS — Z7951 Long term (current) use of inhaled steroids: Secondary | ICD-10-CM | POA: Insufficient documentation

## 2016-12-14 DIAGNOSIS — I10 Essential (primary) hypertension: Secondary | ICD-10-CM | POA: Diagnosis not present

## 2016-12-14 DIAGNOSIS — Z90710 Acquired absence of both cervix and uterus: Secondary | ICD-10-CM | POA: Diagnosis not present

## 2016-12-14 DIAGNOSIS — J45909 Unspecified asthma, uncomplicated: Secondary | ICD-10-CM | POA: Diagnosis not present

## 2016-12-14 DIAGNOSIS — Y838 Other surgical procedures as the cause of abnormal reaction of the patient, or of later complication, without mention of misadventure at the time of the procedure: Secondary | ICD-10-CM | POA: Insufficient documentation

## 2016-12-14 DIAGNOSIS — N939 Abnormal uterine and vaginal bleeding, unspecified: Secondary | ICD-10-CM | POA: Diagnosis present

## 2016-12-14 DIAGNOSIS — T8131XA Disruption of external operation (surgical) wound, not elsewhere classified, initial encounter: Secondary | ICD-10-CM | POA: Insufficient documentation

## 2016-12-14 DIAGNOSIS — F419 Anxiety disorder, unspecified: Secondary | ICD-10-CM | POA: Diagnosis not present

## 2016-12-14 DIAGNOSIS — F329 Major depressive disorder, single episode, unspecified: Secondary | ICD-10-CM | POA: Diagnosis not present

## 2016-12-14 DIAGNOSIS — Z79899 Other long term (current) drug therapy: Secondary | ICD-10-CM | POA: Insufficient documentation

## 2016-12-14 HISTORY — PX: REPAIR VAGINAL CUFF: SHX6067

## 2016-12-14 LAB — CBC
HCT: 42 % (ref 36.0–46.0)
Hemoglobin: 14.8 g/dL (ref 12.0–15.0)
MCH: 33 pg (ref 26.0–34.0)
MCHC: 35.2 g/dL (ref 30.0–36.0)
MCV: 93.8 fL (ref 78.0–100.0)
PLATELETS: 300 10*3/uL (ref 150–400)
RBC: 4.48 MIL/uL (ref 3.87–5.11)
RDW: 12.7 % (ref 11.5–15.5)
WBC: 10.4 10*3/uL (ref 4.0–10.5)

## 2016-12-14 LAB — TYPE AND SCREEN
ABO/RH(D): O POS
Antibody Screen: NEGATIVE

## 2016-12-14 LAB — ABO/RH: ABO/RH(D): O POS

## 2016-12-14 SURGERY — REPAIR, VAGINAL CUFF
Anesthesia: General | Site: Vagina

## 2016-12-14 MED ORDER — DEXAMETHASONE SODIUM PHOSPHATE 10 MG/ML IJ SOLN
INTRAMUSCULAR | Status: DC | PRN
  Administered 2016-12-14: 10 mg via INTRAVENOUS

## 2016-12-14 MED ORDER — HYDROMORPHONE 1 MG/ML IV SOLN
INTRAVENOUS | Status: DC
  Administered 2016-12-14: 0.2 mg via INTRAVENOUS
  Administered 2016-12-15: 4.1 mg via INTRAVENOUS
  Administered 2016-12-15: 0.6 mg via INTRAVENOUS
  Filled 2016-12-14: qty 25

## 2016-12-14 MED ORDER — LABETALOL HCL 5 MG/ML IV SOLN
5.0000 mg | Freq: Once | INTRAVENOUS | Status: AC
  Administered 2016-12-14: 5 mg via INTRAVENOUS

## 2016-12-14 MED ORDER — CEFAZOLIN SODIUM-DEXTROSE 2-3 GM-% IV SOLR
INTRAVENOUS | Status: DC | PRN
  Administered 2016-12-14: 2 g via INTRAVENOUS

## 2016-12-14 MED ORDER — ONDANSETRON HCL 4 MG/2ML IJ SOLN
INTRAMUSCULAR | Status: AC
  Filled 2016-12-14: qty 2

## 2016-12-14 MED ORDER — HYDROMORPHONE HCL 1 MG/ML IJ SOLN
INTRAMUSCULAR | Status: AC
  Filled 2016-12-14: qty 1

## 2016-12-14 MED ORDER — DEXAMETHASONE SODIUM PHOSPHATE 10 MG/ML IJ SOLN
INTRAMUSCULAR | Status: AC
  Filled 2016-12-14: qty 1

## 2016-12-14 MED ORDER — SUCCINYLCHOLINE CHLORIDE 200 MG/10ML IV SOSY
PREFILLED_SYRINGE | INTRAVENOUS | Status: DC | PRN
  Administered 2016-12-14: 100 mg via INTRAVENOUS

## 2016-12-14 MED ORDER — SODIUM CHLORIDE 0.9% FLUSH: 9.0000 mL | INTRAVENOUS | Status: DC | PRN

## 2016-12-14 MED ORDER — HYDROMORPHONE HCL 1 MG/ML IJ SOLN
0.2500 mg | INTRAMUSCULAR | Status: DC | PRN
  Administered 2016-12-14: 0.5 mg via INTRAVENOUS

## 2016-12-14 MED ORDER — NEOSTIGMINE METHYLSULFATE 10 MG/10ML IV SOLN
INTRAVENOUS | Status: AC
  Filled 2016-12-14: qty 1

## 2016-12-14 MED ORDER — ESTRADIOL 0.1 MG/GM VA CREA
TOPICAL_CREAM | VAGINAL | Status: AC
  Filled 2016-12-14: qty 42.5

## 2016-12-14 MED ORDER — ONDANSETRON HCL 4 MG/2ML IJ SOLN
INTRAMUSCULAR | Status: DC | PRN
  Administered 2016-12-14: 4 mg via INTRAVENOUS

## 2016-12-14 MED ORDER — PROPOFOL 10 MG/ML IV BOLUS
INTRAVENOUS | Status: AC
  Filled 2016-12-14: qty 20

## 2016-12-14 MED ORDER — HYDROCHLOROTHIAZIDE 25 MG PO TABS
25.0000 mg | ORAL_TABLET | Freq: Every day | ORAL | Status: DC
  Administered 2016-12-15: 25 mg via ORAL
  Filled 2016-12-14: qty 1

## 2016-12-14 MED ORDER — CITALOPRAM HYDROBROMIDE 20 MG PO TABS
20.0000 mg | ORAL_TABLET | Freq: Every day | ORAL | Status: DC
  Filled 2016-12-14: qty 1

## 2016-12-14 MED ORDER — LABETALOL HCL 5 MG/ML IV SOLN
INTRAVENOUS | Status: AC
  Filled 2016-12-14: qty 4

## 2016-12-14 MED ORDER — PROMETHAZINE HCL 25 MG/ML IJ SOLN: 6.2500 mg | INTRAMUSCULAR | Status: DC | PRN

## 2016-12-14 MED ORDER — ONDANSETRON HCL 4 MG/2ML IJ SOLN
4.0000 mg | Freq: Four times a day (QID) | INTRAMUSCULAR | Status: DC | PRN
Start: 2016-12-14 — End: 2016-12-15

## 2016-12-14 MED ORDER — OXYCODONE-ACETAMINOPHEN 5-325 MG PO TABS
1.0000 | ORAL_TABLET | ORAL | Status: DC | PRN
  Administered 2016-12-14: 1 via ORAL
  Administered 2016-12-15: 2 via ORAL
  Filled 2016-12-14: qty 2
  Filled 2016-12-14: qty 1

## 2016-12-14 MED ORDER — FENTANYL CITRATE (PF) 250 MCG/5ML IJ SOLN
INTRAMUSCULAR | Status: AC
  Filled 2016-12-14: qty 5

## 2016-12-14 MED ORDER — LISINOPRIL-HYDROCHLOROTHIAZIDE 20-25 MG PO TABS: 1.0000 | ORAL_TABLET | Freq: Every day | ORAL | Status: DC

## 2016-12-14 MED ORDER — LIDOCAINE HCL (CARDIAC) 20 MG/ML IV SOLN
INTRAVENOUS | Status: AC
  Filled 2016-12-14: qty 5

## 2016-12-14 MED ORDER — LACTATED RINGERS IV SOLN: INTRAVENOUS | Status: DC

## 2016-12-14 MED ORDER — DIPHENHYDRAMINE HCL 50 MG/ML IJ SOLN
12.5000 mg | Freq: Four times a day (QID) | INTRAMUSCULAR | Status: DC | PRN
  Administered 2016-12-14 – 2016-12-15 (×2): 12.5 mg via INTRAVENOUS
  Filled 2016-12-14: qty 1

## 2016-12-14 MED ORDER — MIDAZOLAM HCL 5 MG/5ML IJ SOLN
INTRAMUSCULAR | Status: DC | PRN
  Administered 2016-12-14: 2 mg via INTRAVENOUS

## 2016-12-14 MED ORDER — PROPOFOL 10 MG/ML IV BOLUS
INTRAVENOUS | Status: DC | PRN
  Administered 2016-12-14: 100 mg via INTRAVENOUS
  Administered 2016-12-14: 200 mg via INTRAVENOUS

## 2016-12-14 MED ORDER — MIDAZOLAM HCL 2 MG/2ML IJ SOLN
INTRAMUSCULAR | Status: AC
  Filled 2016-12-14: qty 2

## 2016-12-14 MED ORDER — GLYCOPYRROLATE 0.2 MG/ML IJ SOLN
INTRAMUSCULAR | Status: AC
Start: 2016-12-14 — End: 2016-12-14
  Filled 2016-12-14: qty 4

## 2016-12-14 MED ORDER — DIPHENHYDRAMINE HCL 12.5 MG/5ML PO ELIX
12.5000 mg | ORAL_SOLUTION | Freq: Four times a day (QID) | ORAL | Status: DC | PRN
  Filled 2016-12-14: qty 5

## 2016-12-14 MED ORDER — LISINOPRIL 20 MG PO TABS
20.0000 mg | ORAL_TABLET | Freq: Every day | ORAL | Status: DC
  Filled 2016-12-14: qty 1

## 2016-12-14 MED ORDER — NALOXONE HCL 0.4 MG/ML IJ SOLN: 0.4000 mg | INTRAMUSCULAR | Status: DC | PRN

## 2016-12-14 MED ORDER — LIDOCAINE HCL (CARDIAC) 20 MG/ML IV SOLN
INTRAVENOUS | Status: DC | PRN
  Administered 2016-12-14: 40 mg via INTRAVENOUS

## 2016-12-14 MED ORDER — LACTATED RINGERS IV SOLN
INTRAVENOUS | Status: DC
  Administered 2016-12-14 (×3): via INTRAVENOUS

## 2016-12-14 MED ORDER — DIPHENHYDRAMINE HCL 50 MG/ML IJ SOLN
INTRAMUSCULAR | Status: AC
  Administered 2016-12-14: 12.5 mg via INTRAVENOUS
  Filled 2016-12-14: qty 1

## 2016-12-14 MED ORDER — TRAMADOL HCL 50 MG PO TABS: 50.0000 mg | ORAL_TABLET | Freq: Four times a day (QID) | ORAL | Status: DC | PRN

## 2016-12-14 MED ORDER — FENTANYL CITRATE (PF) 250 MCG/5ML IJ SOLN
INTRAMUSCULAR | Status: DC | PRN
  Administered 2016-12-14: 150 ug via INTRAVENOUS
  Administered 2016-12-14: 100 ug via INTRAVENOUS
  Administered 2016-12-14: 150 ug via INTRAVENOUS
  Administered 2016-12-14: 100 ug via INTRAVENOUS

## 2016-12-14 SURGICAL SUPPLY — 20 items
CLOTH BEACON ORANGE TIMEOUT ST (SAFETY) ×3 IMPLANT
CONT PATH 16OZ SNAP LID 3702 (MISCELLANEOUS) IMPLANT
DECANTER SPIKE VIAL GLASS SM (MISCELLANEOUS) IMPLANT
ELECT NEEDLE TIP 2.8 STRL (NEEDLE) IMPLANT
GAUZE PACKING 1 X5 YD ST (GAUZE/BANDAGES/DRESSINGS) ×3 IMPLANT
GLOVE BIO SURGEON STRL SZ7.5 (GLOVE) ×3 IMPLANT
GLOVE BIOGEL PI IND STRL 7.0 (GLOVE) ×1 IMPLANT
GLOVE BIOGEL PI INDICATOR 7.0 (GLOVE) ×2
GOWN STRL REUS W/TWL LRG LVL3 (GOWN DISPOSABLE) ×15 IMPLANT
NEEDLE HYPO 22GX1.5 SAFETY (NEEDLE) IMPLANT
NS IRRIG 1000ML POUR BTL (IV SOLUTION) ×3 IMPLANT
PACK VAGINAL WOMENS (CUSTOM PROCEDURE TRAY) IMPLANT
PAD OB MATERNITY 4.3X12.25 (PERSONAL CARE ITEMS) ×3 IMPLANT
SLEEVE SCD COMPRESS KNEE MED (MISCELLANEOUS) ×3 IMPLANT
SUT MON AB 2-0 CT1 36 (SUTURE) ×6 IMPLANT
SUT MON AB 2-0 CT2 27 (SUTURE) IMPLANT
SUT VICRYL 3 0 CT 3 (SUTURE) IMPLANT
TOWEL OR 17X24 6PK STRL BLUE (TOWEL DISPOSABLE) ×6 IMPLANT
TRAY FOLEY CATH SILVER 14FR (SET/KITS/TRAYS/PACK) ×3 IMPLANT
WATER STERILE IRR 1000ML POUR (IV SOLUTION) IMPLANT

## 2016-12-14 NOTE — Op Note (Signed)
12/14/2016  9:01 PM  PATIENT:  Stacy Hull  46 y.o. female  PRE-OPERATIVE DIAGNOSIS:  vaginal bleeding s/p Robotic Hysterectomy on12/22  POST-OPERATIVE DIAGNOSIS:  vaginal bleeding s/p Robotic Hysterectomy on12/22  PROCEDURE:  Procedure(s): REPAIR VAGINAL CUFF  SURGEON:  Surgeon(s): Brien Few, MD  ASSISTANTS: none   ANESTHESIA:   general  ESTIMATED BLOOD LOSS: less than 20cc  DRAINS: Urinary Catheter (Foley)   LOCAL MEDICATIONS USED:  NONE  SPECIMEN:  No Specimen  DISPOSITION OF SPECIMEN:  N/A  COUNTS:  YES  DICTATION #: [id number]  PLAN OF CARE: dc home  PATIENT DISPOSITION:  PACU - hemodynamically stable.

## 2016-12-14 NOTE — Anesthesia Postprocedure Evaluation (Signed)
Anesthesia Post Note  Patient: Stacy Hull  Procedure(s) Performed: Procedure(s) (LRB): REPAIR VAGINAL CUFF (N/A)  Patient location during evaluation: PACU Anesthesia Type: General Level of consciousness: awake and alert Pain management: pain level controlled Vital Signs Assessment: post-procedure vital signs reviewed and stable Respiratory status: spontaneous breathing, nonlabored ventilation, respiratory function stable and patient connected to nasal cannula oxygen Cardiovascular status: blood pressure returned to baseline and stable Postop Assessment: no signs of nausea or vomiting Anesthetic complications: no        Last Vitals:  Vitals:   12/14/16 2215 12/14/16 2232  BP: (!) 155/102 (!) 136/92  Pulse: 91 81  Resp: 10 16  Temp:  37.1 C    Last Pain:  Vitals:   12/14/16 2232  TempSrc:   PainSc: 2    Pain Goal: Patients Stated Pain Goal: 0 (12/14/16 1813)               Tiajuana Amass

## 2016-12-14 NOTE — MAU Note (Signed)
Patient reports to mau with c/o vaginal bleeding and passing clots. States started spotting yesterday evening and bleeding has gotten worse throughout the day with the passing of clots ranging from a "golf ball to a tangerine". States she had a hernia repair and hysterectomy on 11/21/16. Reports pelvic pain but unsure if related to this or her fissure.

## 2016-12-14 NOTE — Anesthesia Preprocedure Evaluation (Signed)
Anesthesia Evaluation  Patient identified by MRN, date of birth, ID band Patient awake    Reviewed: Allergy & Precautions, NPO status , Patient's Chart, lab work & pertinent test results  History of Anesthesia Complications Negative for: history of anesthetic complications  Airway Mallampati: I  TM Distance: >3 FB Neck ROM: Full    Dental  (+) Dental Advisory Given   Pulmonary asthma ,    breath sounds clear to auscultation       Cardiovascular hypertension, Pt. on medications (-) angina Rhythm:Regular Rate:Normal     Neuro/Psych negative neurological ROS     GI/Hepatic Neg liver ROS, GERD  Controlled,  Endo/Other  Morbid obesity  Renal/GU negative Renal ROS     Musculoskeletal   Abdominal (+) + obese,   Peds  Hematology negative hematology ROS (+)   Anesthesia Other Findings   Reproductive/Obstetrics                             Lab Results  Component Value Date   WBC 10.4 12/14/2016   HGB 14.8 12/14/2016   HCT 42.0 12/14/2016   MCV 93.8 12/14/2016   PLT 300 12/14/2016    Anesthesia Physical  Anesthesia Plan  ASA: II and emergent  Anesthesia Plan: General   Post-op Pain Management:    Induction: Intravenous  Airway Management Planned: Oral ETT  Additional Equipment:   Intra-op Plan:   Post-operative Plan:   Informed Consent: I have reviewed the patients History and Physical, chart, labs and discussed the procedure including the risks, benefits and alternatives for the proposed anesthesia with the patient or authorized representative who has indicated his/her understanding and acceptance.   Dental advisory given  Plan Discussed with: CRNA and Surgeon  Anesthesia Plan Comments: (Plan routine monitors, GETA)        Anesthesia Quick Evaluation

## 2016-12-14 NOTE — Transfer of Care (Signed)
Immediate Anesthesia Transfer of Care Note  Patient: Stacy Hull  Procedure(s) Performed: Procedure(s): REPAIR VAGINAL CUFF (N/A)  Patient Location: PACU  Anesthesia Type:General  Level of Consciousness: awake  Airway & Oxygen Therapy: Patient Spontanous Breathing and Patient connected to nasal cannula oxygen  Post-op Assessment: Report given to RN and Post -op Vital signs reviewed and stable  Post vital signs: stable  Last Vitals:  Vitals:   12/14/16 1858 12/14/16 1920  BP: (!) 174/103 (!) 170/107  Pulse: 97 94  Resp: 18 18  Temp: 36.7 C 36.9 C    Last Pain:  Vitals:   12/14/16 1920  TempSrc: Oral  PainSc:       Patients Stated Pain Goal: 0 ([id number])  Complications: No apparent anesthesia complications

## 2016-12-14 NOTE — H&P (Addendum)
Stacy Hull is an 46 y.o. female G2P2002  RP:  Passage of blood clots today, 3 wks post Robotic TLH/Lysis of Adhesions/Hernia repair (Dr Rosendo Gros) with Dr Garwin Brothers 11/21/2016  HPI:  Was progressing well post op, but for the last 2 days, feeling more tired, not quite as comfortable in pelvic/vaginal areas.  Tried to rest more, but today, was driving her kids and had a few coughs and felt blood clots coming out.  She went home and lied down, but the vaginal bleeding persisted, so she presented to MAU.  No dizziness, no fainting.  No SOB, no palpitations.  No fever.  BMs loose stools.  No UTI Sx.    Pertinent Gynecological History:  Blood transfusions: none Last mammogram: normal Last pap: normal  OB History: G2P2002   Menstrual History:  Patient's last menstrual period was 11/03/2016 (exact date).    Past Medical History:  Diagnosis Date  . Allergic rhinitis   . Anxiety   . Asthma    after URI  . Depression   . GERD (gastroesophageal reflux disease)    takes TUMS  . HTN (hypertension)   . Hyperlipidemia   . Hyperparathyroidism ([place])   . Pneumonia     Past Surgical History:  Procedure Laterality Date  . CERVICAL CONE BIOPSY    . CESAREAN SECTION  2005  . HYSTEROSCOPY WITH NOVASURE  11/26/2012   Procedure: HYSTEROSCOPY WITH NOVASURE;  Surgeon: Marvene Staff, MD;  Location: [facility];  Service: Gynecology;  Laterality: N/A;  . LAPAROSCOPIC LYSIS OF ADHESIONS N/A 11/21/2016   Procedure: EXTENSIVE LAPAROSCOPIC LYSIS OF ADHESIONS;  Surgeon: Servando Salina, MD;  Location: [facility];  Service: Gynecology;  Laterality: N/A;  . LAPAROSCOPY N/A 11/21/2016   Procedure: LAPAROSCOPY DIAGNOSTIC, LAPAROSCOPIC REMOVAL OF FOREIGN BODY;  Surgeon: Ralene Ok, MD;  Location: [facility];  Service: General;  Laterality: N/A;  . PARATHYROIDECTOMY  1999   benign  . ROBOTIC ASSISTED LAPAROSCOPIC OVARIAN CYSTECTOMY Right 10/01/2015   Procedure: ROBOTIC ASSISTED LAPAROSCOPIC BILATERAL  SALPINGECTOMY WITH LYSIS OF ADHESIONS AND RIGHT ADNEXAL CYST;  Surgeon: Princess Bruins, MD;  Location: [facility];  Service: Gynecology;  Laterality: Right;  POSSIBLE robot .  DO NOT DRAPE THE ROBOT.....she will use the laparoscopic camera to start.    . ROBOTIC ASSISTED TOTAL HYSTERECTOMY WITH SALPINGECTOMY N/A 11/21/2016   Procedure: ROBOTIC ASSISTED TOTAL HYSTERECTOMY WITH  EXTENSIVE LYSIS OF ADHESIONS;  Surgeon: Servando Salina, MD;  Location: [facility];  Service: Gynecology;  Laterality: N/A;  . TUBAL LIGATION    . UMBILICAL HERNIA REPAIR    . WISDOM TOOTH EXTRACTION      Family History  Problem Relation Age of Onset  . Depression Mother   . Hypertension Father   . Aneurysm Father 77    cerebral  . Diabetes Maternal Grandmother   . Diabetes Maternal Grandfather   . Stroke Paternal Grandfather 37    Social History:  reports that she has never smoked. She has never used smokeless tobacco. She reports that she drinks about 2.0 oz of alcohol per week . She reports that she does not use drugs.  Allergies:  Allergies  Allergen Reactions  . Erythromycin Other (See Comments)    Reaction:  GI upset     Prescriptions Prior to Admission  Medication Sig Dispense Refill Last Dose  . albuterol (PROVENTIL HFA;VENTOLIN HFA) 108 (90 BASE) MCG/ACT inhaler Inhale 1-2 puffs into the lungs every 4 (four) hours as needed for wheezing or shortness of breath. 1 Inhaler 0  12/13/2016 at Unknown time  . ALPRAZolam (XANAX) 0.5 MG tablet Take 1 tablet (0.5 mg total) by mouth 3 (three) times daily as needed for anxiety. 90 tablet 1 12/14/2016 at Unknown time  . citalopram (CELEXA) 20 MG tablet Take 1 tablet (20 mg total) by mouth daily. 90 tablet 4 12/14/2016 at Unknown time  . cyclobenzaprine (FLEXERIL) 10 MG tablet Take 10 mg by mouth 3 (three) times daily as needed for muscle spasms.   Past Week at Unknown time  . fluticasone (FLONASE) 50 MCG/ACT nasal spray Place 2 sprays into both nostrils at bedtime. 16 g  2 12/13/2016 at Unknown time  . HYDROcodone-acetaminophen (NORCO/VICODIN) 5-325 MG tablet Take 1-2 tablets by mouth every 4 (four) hours as needed for moderate pain. 60 tablet 0 12/13/2016 at 1800  . ibuprofen (ADVIL,MOTRIN) 800 MG tablet Take 800 mg by mouth every 8 (eight) hours as needed for mild pain.   12/14/2016 at 1000  . lisinopril-hydrochlorothiazide (PRINZIDE,ZESTORETIC) 20-25 MG tablet Take 1 tablet by mouth daily. 90 tablet 3 12/14/2016 at Unknown time  . Multiple Vitamin (MULTIVITAMIN WITH MINERALS) TABS tablet Take 1 tablet by mouth daily.   12/14/2016 at Unknown time    ROS  Neg  Blood pressure 145/95, pulse 92, temperature 98.3 F (36.8 C), temperature source Oral, resp. rate 18, last menstrual period 11/03/2016, SpO2 99 %. Physical Exam   Abdo soft, non-tender.  BS present.  Incisions healing well.  Speculum exam:  Speculum inserted, it filled up with red blood and clots rapidly.  Large sponge sticks were used to remove the blood.  The vaginal vault showed loosening of the suture line with the vaginal mucosa slightly separated, but not dehisced.  Trickle of blood was coming from the left angle of the vaginal vault.   Given that transfer time (5 pm Sunday) has arrived, I called Dr Ronita Hipps who came over to assess the patient with me and make a decision on management.  Dr Ronita Hipps did a speculum exam and VE and given the absence of dehiscence, but the mild trickle of active bleeding with loosening of the line of suture at the vaginal vault, he opted for vaginal packing.  Vaginal packing was done with a plain 1/2 " packing.  The speculum was removed.  Results for orders placed or performed during the hospital encounter of 12/14/16 (from the past 24 hour(s))  CBC     Status: None   Collection Time: 12/14/16  2:20 PM  Result Value Ref Range   WBC 10.4 4.0 - 10.5 K/uL   RBC 4.48 3.87 - 5.11 MIL/uL   Hemoglobin 14.8 12.0 - 15.0 g/dL   HCT 42.0 36.0 - 46.0 %   MCV 93.8 78.0 - 100.0 fL    MCH 33.0 26.0 - 34.0 pg   MCHC 35.2 30.0 - 36.0 g/dL   RDW 12.7 11.5 - 15.5 %   Platelets 300 150 - 400 K/uL  ABO/Rh     Status: None   Collection Time: 12/14/16  2:20 PM  Result Value Ref Range   ABO/RH(D) O POS      Assessment/Plan: Vaginal cuff bleeding new onset now 3 wks post Robotic TLH.  No evidence of dehiscence of vaginal vault. VS stable. CBC stable. Suture line intact by speculum and manual exam. In the absence of obvious dehiscence or obvious bleeding source decision was made to pack the vagina and observe overnight.    -Will  Consider  removing packing in the am.  If vaginal vault  bleeding persists, surgical options will be discussed.  Repeat CBC at 10 pm.  Patient agrees with management.  Risks associated with hemorrhage, risk of requiring surgery discussed with patient.  Marie-Lyne Lavoie 12/14/2016, 4:56 PM

## 2016-12-14 NOTE — MAU Note (Signed)
Patient presented to MAU with vaginal bleeding and clotting since this morning. She said she passed a tangerine sized clot and a golf-ball sized clot.  Patient is status post hysterectomy, LOA and hernia repair on 11/21/2016. Patient denies fever, chills, abdominal pain, flank tenderness, dizziness, shortness of breath, palpitations or chest pain.  Upon speculum exam the speculum was filled with blood and clots. With a fox swab, attempted to visualize the source of the bleeding; blood was seen trickling from the vaginal wall. Patient's bimanual exam showed no cervical, adnexal or suprapubic tenderness. Dr. Dellis Filbert notified and came immediately to the bedside.

## 2016-12-14 NOTE — Anesthesia Procedure Notes (Signed)
Procedure Name: Intubation Date/Time: 12/14/2016 8:35 PM Performed by: Ignacia Bayley Pre-anesthesia Checklist: Patient identified, Emergency Drugs available, Suction available and Patient being monitored Patient Re-evaluated:Patient Re-evaluated prior to inductionOxygen Delivery Method: Circle system utilized Preoxygenation: Pre-oxygenation with 100% oxygen Intubation Type: IV induction and Rapid sequence Laryngoscope Size: Miller and 2 Grade View: Grade II Tube type: Oral Tube size: 7.0 mm Number of attempts: 1 Airway Equipment and Method: Stylet Placement Confirmation: ETT inserted through vocal cords under direct vision,  positive ETCO2 and breath sounds checked- equal and bilateral Secured at: 21 cm Dental Injury: Teeth and Oropharynx as per pre-operative assessment

## 2016-12-15 LAB — CBC
HCT: 37.7 % (ref 36.0–46.0)
Hemoglobin: 13.2 g/dL (ref 12.0–15.0)
MCH: 32.8 pg (ref 26.0–34.0)
MCHC: 35 g/dL (ref 30.0–36.0)
MCV: 93.8 fL (ref 78.0–100.0)
Platelets: 269 K/uL (ref 150–400)
RBC: 4.02 MIL/uL (ref 3.87–5.11)
RDW: 12.7 % (ref 11.5–15.5)
WBC: 10.7 K/uL — ABNORMAL HIGH (ref 4.0–10.5)

## 2016-12-15 LAB — MRSA PCR SCREENING: MRSA by PCR: NEGATIVE

## 2016-12-15 NOTE — Progress Notes (Signed)
Patient discharged with written instruction. Pt had concerns about pain management that she felt were not addressed. Pt called to Dr. Assunta Curtis office and spoke with her. Dr. Ronita Hipps is the discharging doctor. After speaking with the office, patient is discharging home at this time. Toya Smothers, RN

## 2016-12-15 NOTE — Anesthesia Postprocedure Evaluation (Signed)
Anesthesia Post Note  Patient: Stacy Hull  Procedure(s) Performed: Procedure(s) (LRB): REPAIR VAGINAL CUFF (N/A)  Patient location during evaluation: [facility] Anesthesia Type: General Level of consciousness: awake, awake and alert, oriented and patient cooperative Pain management: pain level controlled Vital Signs Assessment: post-procedure vital signs reviewed and stable Respiratory status: spontaneous breathing, nonlabored ventilation, respiratory function stable and patient connected to nasal cannula oxygen Cardiovascular status: stable Postop Assessment: no headache and no signs of nausea or vomiting Anesthetic complications: no        Last Vitals:  Vitals:   12/15/16 0613 12/15/16 0751  BP: 127/84 139/87  Pulse: 82 84  Resp: 13 18  Temp: 36.9 C 36.9 C    Last Pain:  Vitals:   12/15/16 0752  TempSrc:   PainSc: 3    Pain Goal: Patients Stated Pain Goal: 2 (12/15/16 0752)               Aliviah Spain L

## 2016-12-15 NOTE — Addendum Note (Signed)
Addendum  created 12/15/16 [id number] by Raenette Rover, CRNA   Sign clinical note

## 2016-12-15 NOTE — Progress Notes (Signed)
1 Day Post-Op Procedure(s) (LRB): REPAIR VAGINAL CUFF (N/A)  Subjective: Patient reports tolerating PO, + flatus and + BM.    Objective: I have reviewed patient's vital signs, intake and output, medications and labs.  General: alert, cooperative and appears stated age Resp: clear to auscultation bilaterally and normal percussion bilaterally Cardio: regular rate and rhythm, S1, S2 normal, no murmur, click, rub or gallop and normal apical impulse GI: soft, non-tender; bowel sounds normal; no masses,  no organomegaly Extremities: Homans sign is negative, no sign of DVT Vaginal Bleeding: none  Assessment: s/p Procedure(s): REPAIR VAGINAL CUFF (N/A): stable, progressing well and tolerating diet  Plan: Advance diet Encourage ambulation Advance to PO medication Discontinue IV fluids Discharge home  LOS: 0 days    Stacy Hull 12/15/2016, 8:06 AM

## 2016-12-15 NOTE — Op Note (Signed)
NAME:  Stacy Hull, Stacy Hull            ACCOUNT NO.:  [id number]  MEDICAL RECORD NO.:  [id number]  LOCATION:                                 FACILITY:  PHYSICIAN:  Lovenia Kim, M.D.     DATE OF BIRTH:  DATE OF PROCEDURE: DATE OF DISCHARGE:                              OPERATIVE REPORT   PREOPERATIVE DIAGNOSIS:  Vaginal bleeding after robotic hysterectomy done on November 22, 2015.  POSTOPERATIVE DIAGNOSIS:  Superficial dehiscence of vaginal cuff.  PROCEDURE:  Repair and closure of vaginal cuff.  SURGEON:  Lovenia Kim, M.D.  ASSISTANT:  None.  ANESTHESIA:  General.  ESTIMATED BLOOD LOSS:  Less than 20 mL.  COMPLICATIONS:  None.  DRAINS:  Foley.  COUNTS:  Correct.  DISPOSITION:  The patient to recovery in good condition.  BRIEF OPERATIVE NOTE:  After being apprised of risks of anesthesia, infection, bleeding, injury to surrounding organs, possible need for repair, delayed versus immediate complications to include bowel and bladder injury, possible need for repair, the patient was brought to the operating room, where she was administered general anesthetic without complications, placed in a dorsal lithotomy position.  Feet were placed in the [place] stirrups.  Exam under anesthesia reveals minimal bright red bleeding from the vaginal cuff.  Vaginal packing has been extruded. Foley catheter placed.  The patient was prepped and draped in usual sterile fashion, and retractors were placed.  Weighted retractor placed and  reveals some generalized oozing from the left vaginal cuff, but no evidence of peritoneal dehiscence.  There was no evidence of extrusion of any intraabdominal contents.  This appears to be only evidence of unopposed edges along the cuff.  The cuff was then closed using a 2-0 Monocryl suture using figure-of-eight sutures in an interrupted fashion.  Multiple sutures placed.  Complete hemostasis noted.  One-inch vaginal packing was placed.  Urine  was clear.  The patient tolerated the procedure well, was transferred to recovery in good condition.     Lovenia Kim, M.D.     RJT/MEDQ  D:  12/14/2016  T:  12/15/2016  Job:  [id number]

## 2016-12-16 ENCOUNTER — Encounter (HOSPITAL_COMMUNITY): Payer: Self-pay | Admitting: Obstetrics and Gynecology

## 2016-12-30 ENCOUNTER — Other Ambulatory Visit: Payer: Self-pay | Admitting: Family Medicine

## 2016-12-30 MED ORDER — LISINOPRIL-HYDROCHLOROTHIAZIDE 20-25 MG PO TABS: 1.0000 | ORAL_TABLET | Freq: Every day | ORAL | 3 refills | Status: DC

## 2016-12-30 NOTE — Telephone Encounter (Signed)
Patient does not have voicemail set up. Could not leave a voice message. She needs an office visit for HTN, last OV for this was 12/2015. I provided a 90 day refill. I will forward this to Dr. Brigitte Pulse for her review.

## 2016-12-30 NOTE — Addendum Note (Signed)
Addended by: [name] on: 12/30/2016 04:39 PM   Modules accepted: Orders

## 2016-12-30 NOTE — Telephone Encounter (Signed)
Pt has been in numerous times, her BP has been at goal, bmp last mo nml. Refilled med x 1 yr

## 2017-01-10 ENCOUNTER — Ambulatory Visit (INDEPENDENT_AMBULATORY_CARE_PROVIDER_SITE_OTHER): Payer: BC Managed Care – PPO | Admitting: Family Medicine

## 2017-01-10 VITALS — BP 122/72 | HR 95 | Temp 97.6°F | Resp 17 | Ht 66.5 in | Wt 202.0 lb

## 2017-01-10 DIAGNOSIS — J029 Acute pharyngitis, unspecified: Secondary | ICD-10-CM

## 2017-01-10 LAB — POCT INFLUENZA A/B
Influenza A, POC: NEGATIVE
Influenza B, POC: NEGATIVE

## 2017-01-10 LAB — POCT RAPID STREP A (OFFICE): RAPID STREP A SCREEN: NEGATIVE

## 2017-01-10 MED ORDER — OSELTAMIVIR PHOSPHATE 75 MG PO CAPS: 75.0000 mg | ORAL_CAPSULE | Freq: Every day | ORAL | 0 refills | Status: DC

## 2017-01-10 MED ORDER — IBUPROFEN 800 MG PO TABS: 800.0000 mg | ORAL_TABLET | Freq: Three times a day (TID) | ORAL | 2 refills | Status: DC | PRN

## 2017-01-10 MED ORDER — CYCLOBENZAPRINE HCL 10 MG PO TABS: 10.0000 mg | ORAL_TABLET | Freq: Three times a day (TID) | ORAL | 2 refills | Status: DC | PRN

## 2017-01-10 MED ORDER — CODEINE POLT-CHLORPHEN POLT ER 14.7-2.8 MG/5ML PO SUER: 10.0000 mL | Freq: Two times a day (BID) | ORAL | 0 refills | Status: DC | PRN

## 2017-01-10 NOTE — Progress Notes (Signed)
Subjective:  By signing my name below, I, [name], attest that this documentation has been prepared under the direction and in the presence of [name], MD Electronically Signed: Ladene Artist, ED Scribe 01/10/2017 at 12:04 PM.   Patient ID: Stacy Hull, female    DOB: 11/16/1971, 46 y.o.   MRN: [id number]  Chief Complaint  Patient presents with  . Sore Throat   HPI Stacy Hull is a 46 y.o. female who presents to Primary Care at [facility] complaining of a gradual onset of sore throat first noticed last night. Pt reports associated symptoms of fatigue and generalized body aches. No treatments tried PTA. 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 Pt has received a flu vaccine and pneumonia vaccine this season.   Hernia/Hysterectomy Pt states that the hernia repair was successful and she had a total hysterectomy as well in December. States that she was recovering fine until she started hemorrhaging ~3 weeks following the surgery. Pt returned to work this week.   Past Medical History:  Diagnosis Date  . Allergic rhinitis   . Anxiety   . Asthma    after URI  . Depression   . GERD (gastroesophageal reflux disease)    takes TUMS  . HTN (hypertension)   . Hyperlipidemia   . Hyperparathyroidism ([place])   . Pneumonia    Current Outpatient Prescriptions on File Prior to Visit  Medication Sig Dispense Refill  . albuterol (PROVENTIL HFA;VENTOLIN HFA) 108 (90 BASE) MCG/ACT inhaler Inhale 1-2 puffs into the lungs every 4 (four) hours as needed for wheezing or shortness of breath. 1 Inhaler 0  . ALPRAZolam (XANAX) 0.5 MG tablet Take 1 tablet (0.5 mg total) by mouth 3 (three) times daily as needed for anxiety. 90 tablet 1  . citalopram (CELEXA) 20 MG tablet Take 1 tablet (20 mg total) by mouth daily. 90 tablet 4  . fluticasone (FLONASE) 50 MCG/ACT nasal spray Place 2 sprays into both nostrils at bedtime. 16 g 2  . ibuprofen  (ADVIL,MOTRIN) 800 MG tablet Take 800 mg by mouth every 8 (eight) hours as needed for mild pain.    Marland Kitchen lisinopril-hydrochlorothiazide (PRINZIDE,ZESTORETIC) 20-25 MG tablet Take 1 tablet by mouth daily. 90 tablet 3  . Multiple Vitamin (MULTIVITAMIN WITH MINERALS) TABS tablet Take 1 tablet by mouth daily.     No current facility-administered medications on file prior to visit.    Allergies  Allergen Reactions  . Erythromycin Other (See Comments)    Reaction:  GI upset    Review of Systems  Constitutional: Positive for fatigue. Negative for activity change, appetite change, chills and fever.  HENT: Positive for postnasal drip and sore throat. Negative for rhinorrhea, sinus pain, sinus pressure and trouble swallowing.   Respiratory: Positive for cough. Negative for chest tightness, shortness of breath and wheezing.   Cardiovascular: Negative for chest pain and palpitations.  Gastrointestinal: Negative for abdominal pain, nausea and vomiting.  Endocrine: Negative for polydipsia, polyphagia and polyuria.  Musculoskeletal: Positive for myalgias.  Allergic/Immunologic: Negative for immunocompromised state.  Psychiatric/Behavioral: Positive for sleep disturbance.      Objective:   Physical Exam  Constitutional: She is oriented to person, place, and time. She appears well-developed and well-nourished. No distress.  HENT:  Head: Normocephalic and atraumatic.  Right Ear: Tympanic membrane is scarred and retracted.  Left Ear: Tympanic membrane is scarred and retracted.  Eyes: Conjunctivae and EOM are normal.  Neck: Neck  supple. No tracheal deviation present.  Cardiovascular: Normal rate.   Pulmonary/Chest: Effort normal. No respiratory distress.  Musculoskeletal: Normal range of motion.  Neurological: She is alert and oriented to person, place, and time.  Skin: Skin is warm and dry.  Psychiatric: She has a normal mood and affect. Her behavior is normal.  Nursing note and vitals  reviewed.  BP 122/72 (BP Location: Right Arm, Patient Position: Sitting, Cuff Size: Normal)   Pulse 95   Temp 97.6 F (36.4 C) (Oral)   Resp 17   Ht 5' 6.5" (1.689 m)   Wt 202 lb (91.6 kg)   LMP 11/03/2016 (Exact Date)   SpO2 97%   BMI 32.12 kg/m    Results for orders placed or performed in visit on 01/10/17  POCT Influenza A/B  Result Value Ref Range   Influenza A, POC Negative Negative   Influenza B, POC Negative Negative  POCT rapid strep A  Result Value Ref Range   Rapid Strep A Screen Negative Negative      Assessment & Plan:   1. Acute pharyngitis, unspecified etiology     Orders Placed This Encounter  Procedures  . Culture, Group A Strep    Order Specific Question:   Source    Answer:   oropharynx  . POCT Influenza A/B  . POCT rapid strep A    Meds ordered this encounter  Medications  . oseltamivir (TAMIFLU) 75 MG capsule    Sig: Take 1 capsule (75 mg total) by mouth daily.    Dispense:  14 capsule    Refill:  0  . ibuprofen (ADVIL,MOTRIN) 800 MG tablet    Sig: Take 1 tablet (800 mg total) by mouth every 8 (eight) hours as needed for mild pain.    Dispense:  60 tablet    Refill:  2  . cyclobenzaprine (FLEXERIL) 10 MG tablet    Sig: Take 1 tablet (10 mg total) by mouth 3 (three) times daily as needed for muscle spasms.    Dispense:  30 tablet    Refill:  2  . Codeine Polt-Chlorphen Polt ER (TUZISTRA XR) 14.7-2.8 MG/5ML SUER    Sig: Take 10 mLs by mouth 2 (two) times daily as needed.    Dispense:  180 mL    Refill:  0    I personally performed the services described in this documentation, which was scribed in my presence. The recorded information has been reviewed and considered, and addended by me as needed.   [name], M.D.  Primary Care at [facility] [address] [place], [place] [address] [phone] phone [phone] fax  01/25/17 1:14 AM

## 2017-01-10 NOTE — Patient Instructions (Addendum)
     IF you received an x-ray today, you will receive an invoice from [facility]. Please contact  Radiology at [phone] with questions or concerns regarding your invoice.   IF you received labwork today, you will receive an invoice from [facility]. Please contact [facility] at [phone] with questions or concerns regarding your invoice.   Our billing staff will not be able to assist you with questions regarding bills from these companies.  You will be contacted with the lab results as soon as they are available. The fastest way to get your results is to activate your My Chart account. Instructions are located on the last page of this paperwork. If you have not heard from us regarding the results in 2 weeks, please contact this office.    Pharyngitis Pharyngitis is redness, pain, and swelling (inflammation) of your pharynx. What are the causes? Pharyngitis is usually caused by infection. Most of the time, these infections are from viruses (viral) and are part of a cold. However, sometimes pharyngitis is caused by bacteria (bacterial). Pharyngitis can also be caused by allergies. Viral pharyngitis may be spread from person to person by coughing, sneezing, and personal items or utensils (cups, forks, spoons, toothbrushes). Bacterial pharyngitis may be spread from person to person by more intimate contact, such as kissing. What are the signs or symptoms? Symptoms of pharyngitis include:  Sore throat.  Tiredness (fatigue).  Low-grade fever.  Headache.  Joint pain and muscle aches.  Skin rashes.  Swollen lymph nodes.  Plaque-like film on throat or tonsils (often seen with bacterial pharyngitis).  How is this diagnosed? Your health care provider will ask you questions about your illness and your symptoms. Your medical history, along with a physical exam, is often all that is needed to diagnose pharyngitis. Sometimes, a rapid strep test is done. Other lab tests may  also be done, depending on the suspected cause. How is this treated? Viral pharyngitis will usually get better in 3-4 days without the use of medicine. Bacterial pharyngitis is treated with medicines that kill germs (antibiotics). Follow these instructions at home:  Drink enough water and fluids to keep your urine clear or pale yellow.  Only take over-the-counter or prescription medicines as directed by your health care provider: ? If you are prescribed antibiotics, make sure you finish them even if you start to feel better. ? Do not take aspirin.  Get lots of rest.  Gargle with 8 oz of salt water ( tsp of salt per 1 qt of water) as often as every 1-2 hours to soothe your throat.  Throat lozenges (if you are not at risk for choking) or sprays may be used to soothe your throat. Contact a health care provider if:  You have large, tender lumps in your neck.  You have a rash.  You cough up green, yellow-brown, or bloody spit. Get help right away if:  Your neck becomes stiff.  You drool or are unable to swallow liquids.  You vomit or are unable to keep medicines or liquids down.  You have severe pain that does not go away with the use of recommended medicines.  You have trouble breathing (not caused by a stuffy nose). This information is not intended to replace advice given to you by your health care provider. Make sure you discuss any questions you have with your health care provider. Document Released: 11/17/2005 Document Revised: 04/24/2016 Document Reviewed: 07/25/2013 Elsevier Interactive Patient Education  2017 [employer] Inc[employer]  

## 2017-01-11 ENCOUNTER — Other Ambulatory Visit: Payer: Self-pay | Admitting: Family Medicine

## 2017-01-13 LAB — CULTURE, GROUP A STREP: Strep A Culture: NEGATIVE

## 2017-03-02 ENCOUNTER — Other Ambulatory Visit: Payer: Self-pay | Admitting: Family Medicine

## 2017-03-03 NOTE — Telephone Encounter (Signed)
Please call in rx since I will not be in the office to sign until 4/5

## 2017-03-03 NOTE — Telephone Encounter (Signed)
Called to [facility] 

## 2017-03-03 NOTE — Telephone Encounter (Signed)
09/26/17 last refill

## 2017-03-10 ENCOUNTER — Other Ambulatory Visit: Payer: Self-pay | Admitting: Family Medicine

## 2017-04-12 ENCOUNTER — Other Ambulatory Visit: Payer: Self-pay | Admitting: Urgent Care

## 2017-04-12 ENCOUNTER — Other Ambulatory Visit: Payer: Self-pay | Admitting: Family Medicine

## 2017-04-17 ENCOUNTER — Encounter: Payer: Self-pay | Admitting: Family Medicine

## 2017-04-17 ENCOUNTER — Ambulatory Visit (INDEPENDENT_AMBULATORY_CARE_PROVIDER_SITE_OTHER): Payer: BC Managed Care – PPO | Admitting: Family Medicine

## 2017-04-17 VITALS — BP 152/94 | HR 99 | Temp 98.7°F | Resp 16 | Ht 66.5 in | Wt 198.6 lb

## 2017-04-17 DIAGNOSIS — M62830 Muscle spasm of back: Secondary | ICD-10-CM | POA: Diagnosis not present

## 2017-04-17 DIAGNOSIS — M545 Low back pain, unspecified: Secondary | ICD-10-CM

## 2017-04-17 DIAGNOSIS — G8929 Other chronic pain: Secondary | ICD-10-CM

## 2017-04-17 MED ORDER — HYDROCODONE-ACETAMINOPHEN 5-325 MG PO TABS: 1.0000 | ORAL_TABLET | Freq: Four times a day (QID) | ORAL | 0 refills | Status: DC | PRN

## 2017-04-17 MED ORDER — CYCLOBENZAPRINE HCL 10 MG PO TABS: 10.0000 mg | ORAL_TABLET | Freq: Three times a day (TID) | ORAL | 2 refills | Status: DC | PRN

## 2017-04-17 MED ORDER — DICLOFENAC SODIUM 75 MG PO TBEC: 75.0000 mg | DELAYED_RELEASE_TABLET | Freq: Three times a day (TID) | ORAL | 1 refills | Status: DC

## 2017-04-17 NOTE — Patient Instructions (Addendum)
IF you received an x-ray today, you will receive an invoice from [facility]. Please contact [facility] at [phone] with questions or concerns regarding your invoice.   IF you received labwork today, you will receive an invoice from [facility]. Please contact [facility] at [phone] with questions or concerns regarding your invoice.   Our billing staff will not be able to assist you with questions regarding bills from these companies.  You will be contacted with the lab results as soon as they are available. The fastest way to get your results is to activate your My Chart account. Instructions are located on the last page of this paperwork. If you have not heard from [place] regarding the results in 2 weeks, please contact this office.      Piriformis Syndrome Rehab Ask your health care provider which exercises are safe for you. Do exercises exactly as told by your health care provider and adjust them as directed. It is normal to feel mild stretching, pulling, tightness, or discomfort as you do these exercises, but you should stop right away if you feel sudden pain or your pain gets worse.Do not begin these exercises until told by your health care provider. Stretching and range of motion exercises These exercises warm up your muscles and joints and improve the movement and flexibility of your hip and pelvis. These exercises also help to relieve pain, numbness, and tingling. Exercise A: Hip rotators   1. Lie on your back on a firm surface. 2. Pull your left / right knee toward your same shoulder with your left / right hand until your knee is pointing toward the ceiling. Hold your left / right ankle with your other hand. 3. Keeping your knee steady, gently pull your left / right ankle toward your other shoulder until you feel a stretch in your buttocks. 4. Hold this position for __________ seconds. Repeat __________ times. Complete this stretch __________ times a  day. Exercise B: Hip extensors  1. Lie on your back on a firm surface. Both of your legs should be straight. 2. Pull your left / right knee to your chest. Hold your leg in this position by holding onto the back of your thigh or the front of your knee. 3. Hold this position for __________ seconds. 4. Slowly return to the starting position. Repeat __________ times. Complete this stretch __________ times a day. Strengthening exercises These exercises build strength and endurance in your hip and thigh muscles. Endurance is the ability to use your muscles for a long time, even after they get tired. Exercise C: Straight leg raises (  hip abductors) 1. Lie on your side with your left / right leg in the top position. Lie so your head, shoulder, knee, and hip line up. Bend your bottom knee to help you balance. 2. Lift your top leg up 4-6 inches (10-15 cm), keeping your toes pointed straight ahead. 3. Hold this position for __________ seconds. 4. Slowly lower your leg to the starting position. Let your muscles relax completely. Repeat __________ times. Complete this exercise__________ times a day. Exercise D: Hip abductors and rotators, quadruped  1. Get on your hands and knees on a firm, lightly padded surface. Your hands should be directly below your shoulders, and your knees should be directly below your hips. 2. Lift your left / right knee out to the side. Keep your knee bent. Do not twist your body. 3. Hold this position for __________ seconds. 4. Slowly lower your leg. Repeat  __________ times. Complete this exercise__________ times a day. Exercise E: Straight leg raises ( hip extensors) 1. Lie on your abdomen on a bed or a firm surface with a pillow under your hips. 2. Squeeze your buttock muscles and lift your left / right thigh off the bed. Do not let your back arch. 3. Hold this position for __________ seconds. 4. Slowly return to the starting position. Let your muscles relax completely  before doing another repetition. Repeat __________ times. Complete this exercise__________ times a day. This information is not intended to replace advice given to you by your health care provider. Make sure you discuss any questions you have with your health care provider. Document Released: 11/17/2005 Document Revised: 07/22/2016 Document Reviewed: 10/30/2015 Elsevier Interactive Patient Education  2017 [employer].

## 2017-04-17 NOTE — Progress Notes (Signed)
By signing my name below, I, Mesha Guinyard, attest that this documentation has been prepared under the direction and in the presence of [name], MD.  Electronically Signed: Verlee Monte, Medical Scribe. 04/17/17. 3:48 PM.  Subjective:    Patient ID: Stacy Hull, female    DOB: 1971/11/09, 46 y.o.   MRN: [id number]  HPI  Chief Complaint  Patient presents with  . Back Pain    right lower back pain, radiates to right leg     HPI Comments: Stacy Hull is a 46 y.o. female who presents to Primary Care at [facility] complaining of radiating back pain onset 3 months after her son was born in 2005. Around the time she she ran a marathon and was climbing when she "blew out" her back. Recently, pt was carrying a heavy scooter up a hill when her back began to hurt. Pain radiates from her left hip to her thigh. Reports associated sxs of her right lower back to thigh muscles lock/tighten up, right hip/butt going numb, the feeling of one leg shorter than another causing her to "walk like a cowboy", and feeling a "bump" on her lower back in the concerned area. Pt has tried messaging bengay in concerned spot and taking motrin 800 mg BID with some relief of her sxs. Sxs worsen when she sits. Denies spinal pain, tingling, and focal weakness.  Recovery from Hysterectomy: Pt states it was rough for a while since it would feel like the mesh is brushing up against her ribs. Pt has been feeling good - sxs have resolved.  Patient Active Problem List   Diagnosis Date Noted  . Vaginal bleeding 12/14/2016  . S/P hysterectomy 11/21/2016  . Vitamin D deficiency 05/12/2016  . Anxiety state 09/27/2007  . Hyperlipidemia 03/18/2007  . Depression 03/18/2007  . Essential hypertension 03/18/2007   Past Medical History:  Diagnosis Date  . Allergic rhinitis   . Anxiety   . Asthma    after URI  . Depression   . GERD (gastroesophageal reflux disease)    takes TUMS  . HTN (hypertension)   .  Hyperlipidemia   . Hyperparathyroidism ([place])   . Pneumonia    Past Surgical History:  Procedure Laterality Date  . CERVICAL CONE BIOPSY    . CESAREAN SECTION  2005  . HYSTEROSCOPY WITH NOVASURE  11/26/2012   Procedure: HYSTEROSCOPY WITH NOVASURE;  Surgeon: Marvene Staff, MD;  Location: [facility];  Service: Gynecology;  Laterality: N/A;  . LAPAROSCOPIC LYSIS OF ADHESIONS N/A 11/21/2016   Procedure: EXTENSIVE LAPAROSCOPIC LYSIS OF ADHESIONS;  Surgeon: Servando Salina, MD;  Location: [facility];  Service: Gynecology;  Laterality: N/A;  . LAPAROSCOPY N/A 11/21/2016   Procedure: LAPAROSCOPY DIAGNOSTIC, LAPAROSCOPIC REMOVAL OF FOREIGN BODY;  Surgeon: Ralene Ok, MD;  Location: [facility];  Service: General;  Laterality: N/A;  . PARATHYROIDECTOMY  1999   benign  . REPAIR VAGINAL CUFF N/A 12/14/2016   Procedure: REPAIR VAGINAL CUFF;  Surgeon: Brien Few, MD;  Location: [facility];  Service: Gynecology;  Laterality: N/A;  . ROBOTIC ASSISTED LAPAROSCOPIC OVARIAN CYSTECTOMY Right 10/01/2015   Procedure: ROBOTIC ASSISTED LAPAROSCOPIC BILATERAL SALPINGECTOMY WITH LYSIS OF ADHESIONS AND RIGHT ADNEXAL CYST;  Surgeon: Princess Bruins, MD;  Location: [facility];  Service: Gynecology;  Laterality: Right;  POSSIBLE robot .  DO NOT DRAPE THE ROBOT.....she will use the laparoscopic camera to start.    . ROBOTIC ASSISTED TOTAL HYSTERECTOMY WITH SALPINGECTOMY N/A 11/21/2016   Procedure: ROBOTIC ASSISTED TOTAL HYSTERECTOMY WITH  EXTENSIVE LYSIS OF ADHESIONS;  Surgeon: Servando Salina, MD;  Location: [facility];  Service: Gynecology;  Laterality: N/A;  . TUBAL LIGATION    . UMBILICAL HERNIA REPAIR    . WISDOM TOOTH EXTRACTION     Allergies  Allergen Reactions  . Erythromycin Other (See Comments)    Reaction:  GI upset    Prior to Admission medications   Medication Sig Start Date End Date Taking? Authorizing Provider  albuterol (PROVENTIL HFA;VENTOLIN HFA) 108 (90 BASE) MCG/ACT inhaler Inhale 1-2 puffs into  the lungs every 4 (four) hours as needed for wheezing or shortness of breath. 09/05/14   Wendie Agreste, MD  ALPRAZolam Duanne Moron) 0.5 MG tablet TAKE 1 TABLET BY MOUTH THREE TIMES DAILY AS NEEDED FOR ANXIETY 03/03/17   Shawnee Knapp, MD  citalopram (CELEXA) 20 MG tablet TAKE 1 TABLET BY MOUTH EVERY DAY 04/13/17   Shawnee Knapp, MD  Codeine Polt-Chlorphen Polt ER (TUZISTRA XR) 14.7-2.8 MG/5ML SUER Take 10 mLs by mouth 2 (two) times daily as needed. 01/10/17   Shawnee Knapp, MD  cyclobenzaprine (FLEXERIL) 10 MG tablet Take 1 tablet (10 mg total) by mouth 3 (three) times daily as needed for muscle spasms. 01/10/17   Shawnee Knapp, MD  fluticasone (FLONASE) 50 MCG/ACT nasal spray Place 2 sprays into both nostrils at bedtime. 11/03/16   Shawnee Knapp, MD  ibuprofen (ADVIL,MOTRIN) 800 MG tablet Take 1 tablet (800 mg total) by mouth every 8 (eight) hours as needed for mild pain. 01/10/17   Shawnee Knapp, MD  lisinopril-hydrochlorothiazide (PRINZIDE,ZESTORETIC) 20-25 MG tablet Take 1 tablet by mouth daily. 12/30/16   Shawnee Knapp, MD  Multiple Vitamin (MULTIVITAMIN WITH MINERALS) TABS tablet Take 1 tablet by mouth daily.    [provider]  oseltamivir (TAMIFLU) 75 MG capsule Take 1 capsule (75 mg total) by mouth daily. 01/10/17   Shawnee Knapp, MD   Social History   Social History  . Marital status: Married    Spouse name: Daphyne Miguez  . Number of children: 2  . Years of education: Master's +   Occupational History  . [employer]   Social History Main Topics  . Smoking status: Never Smoker  . Smokeless tobacco: Never Used  . Alcohol use 2.0 oz/week    4 Standard drinks or equivalent per week     Comment: occasionally  . Drug use: No  . Sexual activity: Yes    Partners: Male    Birth control/ protection: Surgical   Other Topics Concern  . Not on file   Social History Narrative   Lives with her husband and their 2 children. Walks 3x's  weekly for exercise.   Review of Systems  Musculoskeletal: Positive for back pain, gait problem and myalgias.  Neurological: Positive for numbness. Negative for weakness.   Objective:  Physical Exam  Constitutional: She appears well-developed and well-nourished. No distress.  HENT:  Head: Normocephalic and atraumatic.  Eyes: Conjunctivae are normal.  Neck: Neck supple.  Cardiovascular: Normal rate.   Pulmonary/Chest: Effort normal.  Musculoskeletal:  No tenderness over the trochanter burse Negative seated straight leg raise Not TTP over lumbar spine or all paraspinal TTP over right low paraspinal with spasm  Neurological: She is alert.  Reflex Scores:      Patellar reflexes are 2+ on the right side and 2+ on the left side.      Achilles reflexes  are 2+ on the right side and 2+ on the left side. 5/5 strength  Skin: Skin is warm and dry.  Psychiatric: She has a normal mood and affect. Her behavior is normal.  Nursing note and vitals reviewed.   Vitals:   04/17/17 1529  BP: (!) 152/94  Pulse: 99  Resp: 16  Temp: 98.7 F (37.1 C)  TempSrc: Oral  SpO2: 96%  Weight: 198 lb 9.6 oz (90.1 kg)  Height: 5' 6.5" (1.689 m)   Body mass index is 31.57 kg/m. Assessment & Plan:   1. Chronic right-sided low back pain without sciatica   2. Lumbar paraspinal muscle spasm    Pt with long-standing h/o of occ flairs of right low lumbar paraspinal muscle spasm for > 10 yrs, current is not responding to otc nsaids and home massage techniques.  Try intensive intervention with 2-3d of freq heat, stretch, point massage/pressure, and schedule rx nsaid and muscle relaxer with hydrocodone for breakthrough pain. After no more than 3d, try to get back to normal acitvity as quickly as possible. If sxs cont, rec repeat course of PT.  Meds ordered this encounter  Medications  . cyclobenzaprine (FLEXERIL) 10 MG tablet    Sig: Take 1 tablet (10 mg total) by mouth 3 (three) times daily as needed for  muscle spasms.    Dispense:  30 tablet    Refill:  2  . diclofenac (VOLTAREN) 75 MG EC tablet    Sig: Take 1 tablet (75 mg total) by mouth 3 (three) times daily.    Dispense:  30 tablet    Refill:  1  . HYDROcodone-acetaminophen (NORCO/VICODIN) 5-325 MG tablet    Sig: Take 1 tablet by mouth every 6 (six) hours as needed for moderate pain.    Dispense:  20 tablet    Refill:  0    [name], M.D.  Primary Care at [facility] [address] [place], [place] [address] [phone] phone [phone] fax  04/19/17 11:01 AM

## 2017-04-28 ENCOUNTER — Encounter: Payer: Self-pay | Admitting: Physician Assistant

## 2017-04-28 ENCOUNTER — Ambulatory Visit (INDEPENDENT_AMBULATORY_CARE_PROVIDER_SITE_OTHER): Payer: BC Managed Care – PPO | Admitting: Physician Assistant

## 2017-04-28 VITALS — BP 122/98 | HR 90 | Temp 98.6°F | Resp 16 | Ht 66.75 in | Wt 197.0 lb

## 2017-04-28 DIAGNOSIS — H00012 Hordeolum externum right lower eyelid: Secondary | ICD-10-CM | POA: Diagnosis not present

## 2017-04-28 DIAGNOSIS — I10 Essential (primary) hypertension: Secondary | ICD-10-CM | POA: Diagnosis not present

## 2017-04-28 MED ORDER — ERYTHROMYCIN 5 MG/GM OP OINT: 1.0000 "application " | TOPICAL_OINTMENT | Freq: Two times a day (BID) | OPHTHALMIC | 0 refills | Status: AC

## 2017-04-28 NOTE — Progress Notes (Signed)
Stacy Hull  MRN: [id number] DOB: March 12, 1971  PCP: Shawnee Knapp, MD  Chief Complaint  Patient presents with  . bump    under right eye x 3 days    Subjective:  Pt presents to clinic for stye on her right lower lid for the last 3 days.  Gritty sandy feeling in the area.  She tried warm compresses - the swelling and erythema has not gotten worse.  Vision is being affected with the swelling otherwise her globe is not affected.  Review of Systems  Eyes: Positive for visual disturbance. Negative for photophobia, pain, discharge, redness and itching.    Patient Active Problem List   Diagnosis Date Noted  . Vaginal bleeding 12/14/2016  . S/P hysterectomy 11/21/2016  . Vitamin D deficiency 05/12/2016  . Anxiety state 09/27/2007  . Hyperlipidemia 03/18/2007  . Depression 03/18/2007  . Essential hypertension 03/18/2007    Current Outpatient Prescriptions on File Prior to Visit  Medication Sig Dispense Refill  . ALPRAZolam (XANAX) 0.5 MG tablet TAKE 1 TABLET BY MOUTH THREE TIMES DAILY AS NEEDED FOR ANXIETY 90 tablet 1  . citalopram (CELEXA) 20 MG tablet TAKE 1 TABLET BY MOUTH EVERY DAY 90 tablet 1  . cyclobenzaprine (FLEXERIL) 10 MG tablet Take 1 tablet (10 mg total) by mouth 3 (three) times daily as needed for muscle spasms. 30 tablet 2  . fluticasone (FLONASE) 50 MCG/ACT nasal spray Place 2 sprays into both nostrils at bedtime. 16 g 2  . ibuprofen (ADVIL,MOTRIN) 800 MG tablet Take 1 tablet (800 mg total) by mouth every 8 (eight) hours as needed for mild pain. 60 tablet 2  . lisinopril-hydrochlorothiazide (PRINZIDE,ZESTORETIC) 20-25 MG tablet Take 1 tablet by mouth daily. 90 tablet 3  . Multiple Vitamin (MULTIVITAMIN WITH MINERALS) TABS tablet Take 1 tablet by mouth daily.    . diclofenac (VOLTAREN) 75 MG EC tablet Take 1 tablet (75 mg total) by mouth 3 (three) times daily. (Patient not taking: Reported on 04/28/2017) 30 tablet 1   No current facility-administered medications on  file prior to visit.     Allergies  Allergen Reactions  . Erythromycin Other (See Comments)    Reaction:  GI upset     Pt patients past, family and social history were reviewed and updated.   Objective:  BP (!) 138/102 (BP Location: Left Arm, Cuff Size: Large)   Pulse 90   Temp 98.6 F (37 C) (Oral)   Resp 16   Ht 5' 6.75" (1.695 m)   Wt 197 lb (89.4 kg)   LMP 11/03/2016 (Exact Date)   SpO2 99%   BMI 31.09 kg/m   Physical Exam  Constitutional: She is oriented to person, place, and time and well-developed, well-nourished, and in no distress.  HENT:  Head: Normocephalic and atraumatic.  Right Ear: Hearing and external ear normal.  Left Ear: Hearing and external ear normal.  Eyes: Conjunctivae and EOM are normal. Pupils are equal, round, and reactive to light. Right eye exhibits hordeolum (lower inner eyelid pustules with central hair and surroudning erythema).    Central hair removed with instrument - pustule opened and drained.  Pt felt relief.  Neck: Normal range of motion.  Pulmonary/Chest: Effort normal.  Neurological: She is alert and oriented to person, place, and time. Gait normal.  Skin: Skin is warm and dry.  Psychiatric: Mood, memory, affect and judgment normal.  Vitals reviewed.   Assessment and Plan :  Hordeolum externum of right lower eyelid - Plan: erythromycin ophthalmic  ointment  Essential hypertension pt to monitor her BP at home - she had alot of salt over the weekend and she will try to be mindful of salt use  Warm compresses and gentle friction with babywash - use ointment on external area - f/u if worsening in the next 2-3 days  Windell Hummingbird PA-C  Primary Care at [place] 04/28/2017 12:38 PM

## 2017-04-28 NOTE — Patient Instructions (Addendum)
Warm compresses with gentle friction  Baby shampoo tear free - wash eye to reduce bacteria along eye lid     IF you received an x-ray today, you will receive an invoice from [facility]. Please contact [facility] at [phone] with questions or concerns regarding your invoice.   IF you received labwork today, you will receive an invoice from [facility]. Please contact [facility] at [phone] with questions or concerns regarding your invoice.   Our billing staff will not be able to assist you with questions regarding bills from these companies.  You will be contacted with the lab results as soon as they are available. The fastest way to get your results is to activate your My Chart account. Instructions are located on the last page of this paperwork. If you have not heard from [place] regarding the results in 2 weeks, please contact this office.

## 2017-04-30 ENCOUNTER — Ambulatory Visit (INDEPENDENT_AMBULATORY_CARE_PROVIDER_SITE_OTHER): Payer: BC Managed Care – PPO | Admitting: Physician Assistant

## 2017-04-30 VITALS — BP 135/87 | HR 120 | Temp 99.2°F | Resp 16 | Ht 66.75 in | Wt 197.6 lb

## 2017-04-30 DIAGNOSIS — J029 Acute pharyngitis, unspecified: Secondary | ICD-10-CM | POA: Diagnosis not present

## 2017-04-30 DIAGNOSIS — R110 Nausea: Secondary | ICD-10-CM

## 2017-04-30 DIAGNOSIS — J020 Streptococcal pharyngitis: Secondary | ICD-10-CM

## 2017-04-30 LAB — POCT RAPID STREP A (OFFICE): Rapid Strep A Screen: NEGATIVE

## 2017-04-30 MED ORDER — AMOXICILLIN 500 MG PO CAPS: 500.0000 mg | ORAL_CAPSULE | Freq: Two times a day (BID) | ORAL | 0 refills | Status: AC

## 2017-04-30 MED ORDER — ONDANSETRON HCL 8 MG PO TABS: 8.0000 mg | ORAL_TABLET | Freq: Three times a day (TID) | ORAL | 0 refills | Status: DC | PRN

## 2017-04-30 NOTE — Patient Instructions (Signed)
Drink warm tea with honey to soothe your throat. Stay well hydrated.  Ibuprofen/Tylenol for pain.  Please take ENTIRE COURSE of antibiotics, even if you start to feel better before the course ends.  Come back if you are not starting to improve in a few days or if your symptoms worsen.   Thank you for coming in today. I hope you feel we met your needs.  Feel free to call [facility] if you have any questions or further requests.  Please consider signing up for MyChart if you do not already have it, as this is a great way to communicate with me.  Best,  [employer], PA-C

## 2017-04-30 NOTE — Progress Notes (Signed)
Stacy Hull  MRN: [id number] DOB: [birth year]  PCP: Shawnee Knapp, MD  Subjective:  Pt is a 46 year old female who presents to clinic for sore throat x 1 day. She woke up last night with sore throat, fever, chills and sweats. She endorses nausea, no vomiting. Her son was treated for strep throat a few days ago.  Denies abdominal pain, cough, runny nose, sinus pain or pressure, headache, wheezing, shob.  Review of Systems  Constitutional: Positive for chills, diaphoresis, fatigue and fever.  HENT: Positive for sore throat. Negative for congestion, postnasal drip, rhinorrhea, sinus pain and sinus pressure.   Respiratory: Negative for cough.   Gastrointestinal: Positive for nausea.    Patient Active Problem List   Diagnosis Date Noted  . Vaginal bleeding 12/14/2016  . S/P hysterectomy 11/21/2016  . Vitamin D deficiency 05/12/2016  . Anxiety state 09/27/2007  . Hyperlipidemia 03/18/2007  . Depression 03/18/2007  . Essential hypertension 03/18/2007    Current Outpatient Prescriptions on File Prior to Visit  Medication Sig Dispense Refill  . ALPRAZolam (XANAX) 0.5 MG tablet TAKE 1 TABLET BY MOUTH THREE TIMES DAILY AS NEEDED FOR ANXIETY 90 tablet 1  . citalopram (CELEXA) 20 MG tablet TAKE 1 TABLET BY MOUTH EVERY DAY 90 tablet 1  . cyclobenzaprine (FLEXERIL) 10 MG tablet Take 1 tablet (10 mg total) by mouth 3 (three) times daily as needed for muscle spasms. 30 tablet 2  . erythromycin ophthalmic ointment Place 1 application into the right eye 2 (two) times daily. 1 g 0  . fluticasone (FLONASE) 50 MCG/ACT nasal spray Place 2 sprays into both nostrils at bedtime. 16 g 2  . ibuprofen (ADVIL,MOTRIN) 800 MG tablet Take 1 tablet (800 mg total) by mouth every 8 (eight) hours as needed for mild pain. 60 tablet 2  . lisinopril-hydrochlorothiazide (PRINZIDE,ZESTORETIC) 20-25 MG tablet Take 1 tablet by mouth daily. 90 tablet 3  . Multiple Vitamin (MULTIVITAMIN WITH MINERALS) TABS tablet Take  1 tablet by mouth daily.    . diclofenac (VOLTAREN) 75 MG EC tablet Take 1 tablet (75 mg total) by mouth 3 (three) times daily. (Patient not taking: Reported on 04/28/2017) 30 tablet 1   No current facility-administered medications on file prior to visit.     Allergies  Allergen Reactions  . Erythromycin Other (See Comments)    Reaction:  GI upset      Objective:  BP 135/87   Pulse (!) 120   Temp 99.2 F (37.3 C) (Oral)   Resp 16   Ht 5' 6.75" (1.695 m)   Wt 197 lb 9.6 oz (89.6 kg)   LMP 11/03/2016 (Exact Date)   SpO2 97%   BMI 31.18 kg/m   Physical Exam  Constitutional: She is oriented to person, place, and time and well-developed, well-nourished, and in no distress. No distress.  HENT:  Right Ear: Tympanic membrane normal.  Left Ear: Tympanic membrane normal.  Mouth/Throat: Mucous membranes are normal. Posterior oropharyngeal erythema present. No oropharyngeal exudate or posterior oropharyngeal edema.  Cardiovascular: Normal rate, regular rhythm and normal heart sounds.   Neurological: She is alert and oriented to person, place, and time. GCS score is 15.  Skin: Skin is warm and dry.  Psychiatric: Mood, memory, affect and judgment normal.  Vitals reviewed.  Results for orders placed or performed in visit on 04/30/17  POCT rapid strep A  Result Value Ref Range   Rapid Strep A Screen Negative Negative    Assessment and Plan :  1. Streptococcal sore throat 2. Sore throat 3. Nausea without vomiting - POCT rapid strep A - Culture, Group A Strep - amoxicillin (AMOXIL) 500 MG capsule; Take 1 capsule (500 mg total) by mouth 2 (two) times daily.  Dispense: 20 capsule; Refill: 0 - ondansetron (ZOFRAN) 8 MG tablet; Take 1 tablet (8 mg total) by mouth every 8 (eight) hours as needed for nausea or vomiting.  Dispense: 20 tablet; Refill: 0 - Suspect false negative due to pt H&P and recent exposure to strep + contact. Will treat. Culture is pending. Supportive care: push fluids,  rest. RTC if symptoms worsen/do not improve. She agrees with plan.   Mercer Pod, PA-C  Primary Care at [place] 04/30/2017 11:53 AM

## 2017-05-03 LAB — CULTURE, GROUP A STREP: Strep A Culture: NEGATIVE

## 2017-05-12 ENCOUNTER — Ambulatory Visit: Payer: BC Managed Care – PPO | Admitting: Physician Assistant

## 2017-05-12 ENCOUNTER — Ambulatory Visit: Payer: BC Managed Care – PPO | Admitting: Family Medicine

## 2017-07-10 ENCOUNTER — Other Ambulatory Visit: Payer: Self-pay | Admitting: Family Medicine

## 2017-07-19 ENCOUNTER — Encounter: Payer: Self-pay | Admitting: Family Medicine
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
## 2017-07-20 ENCOUNTER — Ambulatory Visit (INDEPENDENT_AMBULATORY_CARE_PROVIDER_SITE_OTHER): Payer: BC Managed Care – PPO | Admitting: Family Medicine

## 2017-07-20 ENCOUNTER — Encounter: Payer: Self-pay | Admitting: Family Medicine

## 2017-07-20 VITALS — BP 132/90 | HR 103 | Temp 98.8°F | Resp 18 | Ht 66.75 in | Wt 197.2 lb

## 2017-07-20 DIAGNOSIS — F3342 Major depressive disorder, recurrent, in full remission: Secondary | ICD-10-CM | POA: Diagnosis not present

## 2017-07-20 DIAGNOSIS — N951 Menopausal and female climacteric states: Secondary | ICD-10-CM | POA: Diagnosis not present

## 2017-07-20 DIAGNOSIS — R7309 Other abnormal glucose: Secondary | ICD-10-CM

## 2017-07-20 DIAGNOSIS — E7800 Pure hypercholesterolemia, unspecified: Secondary | ICD-10-CM | POA: Diagnosis not present

## 2017-07-20 DIAGNOSIS — I10 Essential (primary) hypertension: Secondary | ICD-10-CM

## 2017-07-20 DIAGNOSIS — E559 Vitamin D deficiency, unspecified: Secondary | ICD-10-CM | POA: Diagnosis not present

## 2017-07-20 DIAGNOSIS — F411 Generalized anxiety disorder: Secondary | ICD-10-CM | POA: Diagnosis not present

## 2017-07-20 MED ORDER — IBUPROFEN 800 MG PO TABS: 800.0000 mg | ORAL_TABLET | Freq: Three times a day (TID) | ORAL | 2 refills | Status: DC | PRN

## 2017-07-20 MED ORDER — CITALOPRAM HYDROBROMIDE 40 MG PO TABS: 40.0000 mg | ORAL_TABLET | Freq: Every day | ORAL | 1 refills | Status: DC

## 2017-07-20 MED ORDER — TRAZODONE HCL 50 MG PO TABS: 25.0000 mg | ORAL_TABLET | Freq: Every evening | ORAL | 3 refills | Status: DC | PRN

## 2017-07-20 MED ORDER — CYCLOBENZAPRINE HCL 10 MG PO TABS: 10.0000 mg | ORAL_TABLET | Freq: Three times a day (TID) | ORAL | 2 refills | Status: DC | PRN

## 2017-07-20 MED ORDER — ALPRAZOLAM 0.5 MG PO TABS: 0.5000 mg | ORAL_TABLET | Freq: Three times a day (TID) | ORAL | 1 refills | Status: DC | PRN

## 2017-07-20 NOTE — Patient Instructions (Addendum)
     IF you received an x-ray today, you will receive an invoice from [facility]. Please contact [facility] at [phone] with questions or concerns regarding your invoice.   IF you received labwork today, you will receive an invoice from [facility]. Please contact [facility] at [phone] with questions or concerns regarding your invoice.   Our billing staff will not be able to assist you with questions regarding bills from these companies.  You will be contacted with the lab results as soon as they are available. The fastest way to get your results is to activate your My Chart account. Instructions are located on the last page of this paperwork. If you have not heard from [place] regarding the results in 2 weeks, please contact this office.    No psychological or psychiatric services take physician referrals - they always want the patient to call. Some excellent private psychiatrists for individual counseling are:  [facility]  Recently moved - near [facility]. [phone]  [facility] is really good at practical tips/tools. [address], [place], [place] [address]  Phone: [phone]  [facility] [place]  [place], [place] [address] Phone: [facility] [address] [place], [place] [address]  [phone]  [facility] [address] [place], [place] Phone: [phone]

## 2017-07-29 LAB — COMPREHENSIVE METABOLIC PANEL
ALBUMIN: 4.6 g/dL (ref 3.5–5.5)
ALT: 18 IU/L (ref 0–32)
AST: 19 IU/L (ref 0–40)
Albumin/Globulin Ratio: 1.7 (ref 1.2–2.2)
Alkaline Phosphatase: 55 IU/L (ref 39–117)
BUN / CREAT RATIO: 9 (ref 9–23)
BUN: 9 mg/dL (ref 6–24)
Bilirubin Total: 0.4 mg/dL (ref 0.0–1.2)
CALCIUM: 9.6 mg/dL (ref 8.7–10.2)
CO2: 20 mmol/L (ref 20–29)
CREATININE: 0.98 mg/dL (ref 0.57–1.00)
Chloride: 97 mmol/L (ref 96–106)
GFR, EST AFRICAN AMERICAN: 82 mL/min/{1.73_m2} (ref >59)
GFR, EST NON AFRICAN AMERICAN: 71 mL/min/{1.73_m2} (ref >59)
GLOBULIN, TOTAL: 2.7 g/dL (ref 1.5–4.5)
GLUCOSE: 235 mg/dL — AB (ref 65–99)
Potassium: 3.8 mmol/L (ref 3.5–5.2)
SODIUM: 138 mmol/L (ref 134–144)
TOTAL PROTEIN: 7.3 g/dL (ref 6.0–8.5)

## 2017-07-29 LAB — VITAMIN D 25 HYDROXY (VIT D DEFICIENCY, FRACTURES): Vit D, 25-Hydroxy: 21.9 ng/mL — ABNORMAL LOW (ref 30.0–100.0)

## 2017-07-29 LAB — HORMONE PANEL (T4,TSH,FSH,TESTT,SHBG,DHEA,ETC)
DHEA: 247 ug/dL — AB
ESTRONE: 333 ng/dL
Estradiol, Serum, MS: 76 pg/mL
FREE T-3: 3.2 pg/mL
FREE TESTOSTERONE, SERUM: 4.3 pg/mL
FSH: 27 m[IU]/mL
PROGESTERONE: 61 ng/dL
SEX HORMONE BINDING GLOBULIN: 18.6 nmol/L — AB
T4: 6.8 ug/dL
TSH: 1.6 uU/mL
Testosterone, Serum (Total): 24 ng/dL
Testosterone-% Free: 1.8 % — ABNORMAL HIGH
Triiodothyronine (T-3), Serum: 109 ng/dL

## 2017-07-29 LAB — HEMOGLOBIN A1C
Est. average glucose Bld gHb Est-mCnc: 143 mg/dL
HEMOGLOBIN A1C: 6.6 % — AB (ref 4.8–5.6)

## 2017-08-13 ENCOUNTER — Other Ambulatory Visit: Payer: Self-pay | Admitting: Family Medicine

## 2017-08-13 ENCOUNTER — Encounter: Payer: Self-pay | Admitting: Family Medicine

## 2017-08-13 ENCOUNTER — Ambulatory Visit (INDEPENDENT_AMBULATORY_CARE_PROVIDER_SITE_OTHER): Payer: BC Managed Care – PPO | Admitting: Family Medicine

## 2017-08-13 VITALS — BP 123/79 | HR 81 | Temp 98.9°F | Resp 16 | Ht 65.75 in | Wt 197.2 lb

## 2017-08-13 DIAGNOSIS — J0100 Acute maxillary sinusitis, unspecified: Secondary | ICD-10-CM

## 2017-08-13 DIAGNOSIS — S0502XA Injury of conjunctiva and corneal abrasion without foreign body, left eye, initial encounter: Secondary | ICD-10-CM | POA: Diagnosis not present

## 2017-08-13 MED ORDER — AMOXICILLIN-POT CLAVULANATE 875-125 MG PO TABS: 1.0000 | ORAL_TABLET | Freq: Two times a day (BID) | ORAL | 0 refills | Status: DC

## 2017-08-13 MED ORDER — OFLOXACIN 0.3 % OP SOLN: OPHTHALMIC | 0 refills | Status: DC

## 2017-08-13 MED ORDER — PREDNISONE 20 MG PO TABS: ORAL_TABLET | ORAL | 0 refills | Status: DC

## 2017-08-13 NOTE — Progress Notes (Signed)
Subjective:    Patient ID: Stacy Hull, female    DOB: [birth year], 46 y.o.   MRN: [id number] Chief Complaint  Patient presents with  . Eye Pain    both - SXs started x 1week ago  . Sore Throat    HPI 1 wk of sinus pressure and ear pressure Then daughter started getting sick and was started on amox  She though she scratchesd her eye with her contacts. otc eye drops make it veyr painful and she could see swelling o fthe conjunctiva.  Rt gritty.   Is feeling better on the citalopram 40 - enjoying things - looking forward to things.  Using the trazodone prn but does cause a hangover.   Husband working    Past Medical History:  Diagnosis Date  . Allergic rhinitis   . Anxiety   . Asthma    after URI  . Depression   . GERD (gastroesophageal reflux disease)    takes TUMS  . HTN (hypertension)   . Hyperlipidemia   . Hyperparathyroidism ([place])   . Pneumonia    Past Surgical History:  Procedure Laterality Date  . CERVICAL CONE BIOPSY    . CESAREAN SECTION  2005  . HYSTEROSCOPY WITH NOVASURE  11/26/2012   Procedure: HYSTEROSCOPY WITH NOVASURE;  Surgeon: Marvene Staff, MD;  Location: [facility];  Service: Gynecology;  Laterality: N/A;  . LAPAROSCOPIC LYSIS OF ADHESIONS N/A 11/21/2016   Procedure: EXTENSIVE LAPAROSCOPIC LYSIS OF ADHESIONS;  Surgeon: Servando Salina, MD;  Location: [facility];  Service: Gynecology;  Laterality: N/A;  . LAPAROSCOPY N/A 11/21/2016   Procedure: LAPAROSCOPY DIAGNOSTIC, LAPAROSCOPIC REMOVAL OF FOREIGN BODY;  Surgeon: Ralene Ok, MD;  Location: [facility];  Service: General;  Laterality: N/A;  . PARATHYROIDECTOMY  1999   benign  . REPAIR VAGINAL CUFF N/A 12/14/2016   Procedure: REPAIR VAGINAL CUFF;  Surgeon: Brien Few, MD;  Location: [facility];  Service: Gynecology;  Laterality: N/A;  . ROBOTIC ASSISTED LAPAROSCOPIC OVARIAN CYSTECTOMY Right 10/01/2015   Procedure: ROBOTIC ASSISTED LAPAROSCOPIC BILATERAL SALPINGECTOMY WITH LYSIS OF ADHESIONS  AND RIGHT ADNEXAL CYST;  Surgeon: Princess Bruins, MD;  Location: [facility];  Service: Gynecology;  Laterality: Right;  POSSIBLE robot .  DO NOT DRAPE THE ROBOT.....she will use the laparoscopic camera to start.    . ROBOTIC ASSISTED TOTAL HYSTERECTOMY WITH SALPINGECTOMY N/A 11/21/2016   Procedure: ROBOTIC ASSISTED TOTAL HYSTERECTOMY WITH  EXTENSIVE LYSIS OF ADHESIONS;  Surgeon: Servando Salina, MD;  Location: [facility];  Service: Gynecology;  Laterality: N/A;  . TUBAL LIGATION    . UMBILICAL HERNIA REPAIR    . WISDOM TOOTH EXTRACTION     Current Outpatient Prescriptions on File Prior to Visit  Medication Sig Dispense Refill  . ALPRAZolam (XANAX) 0.5 MG tablet Take 1 tablet (0.5 mg total) by mouth 3 (three) times daily as needed. for anxiety 90 tablet 1  . citalopram (CELEXA) 40 MG tablet Take 1 tablet (40 mg total) by mouth daily. 30 tablet 1  . cyclobenzaprine (FLEXERIL) 10 MG tablet Take 1 tablet (10 mg total) by mouth 3 (three) times daily as needed for muscle spasms. 30 tablet 2  . ibuprofen (ADVIL,MOTRIN) 800 MG tablet Take 1 tablet (800 mg total) by mouth every 8 (eight) hours as needed for mild pain. 60 tablet 2  . lisinopril-hydrochlorothiazide (PRINZIDE,ZESTORETIC) 20-25 MG tablet Take 1 tablet by mouth daily. 90 tablet 3  . Multiple Vitamin (MULTIVITAMIN WITH MINERALS) TABS tablet Take 1 tablet by mouth daily.    [facility]  traZODone (DESYREL) 50 MG tablet Take 0.5-1 tablets (25-50 mg total) by mouth at bedtime as needed for sleep. 30 tablet 3   No current facility-administered medications on file prior to visit.    Allergies  Allergen Reactions  . Erythromycin Other (See Comments)    Reaction:  GI upset    Family History  Problem Relation Age of Onset  . Depression Mother   . Hypertension Father   . Aneurysm Father 46       cerebral  . Diabetes Maternal Grandmother   . Diabetes Maternal Grandfather   . Stroke Paternal Grandfather 73   Social History   Social History  . Marital  status: Married    Spouse name: Loveah Like  . Number of children: 2  . Years of education: Master's +   Occupational History  . [employer]   Social History Main Topics  . Smoking status: Never Smoker  . Smokeless tobacco: Never Used  . Alcohol use 2.0 oz/week    4 Standard drinks or equivalent per week     Comment: occasionally  . Drug use: No  . Sexual activity: Yes    Partners: Male    Birth control/ protection: Surgical   Other Topics Concern  . None   Social History Narrative   Lives with her husband and their 2 children. Walks 3x's weekly for exercise.   Depression screen [facility] 2/9 08/13/2017 07/20/2017 04/28/2017 04/17/2017 01/10/2017  Decreased Interest 0 3 0 0 0  Down, Depressed, Hopeless 0 2 0 0 0  PHQ - 2 Score 0 5 0 0 0  Altered sleeping 2 3 - - -  Tired, decreased energy 2 3 - - -  Change in appetite 0 1 - - -  Feeling bad or failure about yourself  0 1 - - -  Trouble concentrating 0 0 - - -  Moving slowly or fidgety/restless 0 1 - - -  Suicidal thoughts 0 1 - - -  PHQ-9 Score 4 15 - - -  Difficult doing work/chores - Somewhat difficult - - -    Review of Systems See hpi    Objective:   Physical Exam  Constitutional: She is oriented to person, place, and time. She appears well-developed and well-nourished. She appears lethargic. She appears ill. No distress.  HENT:  Head: Normocephalic and atraumatic.  Right Ear: External ear and ear canal normal. Tympanic membrane is retracted. A middle ear effusion is present.  Left Ear: External ear and ear canal normal. Tympanic membrane is retracted. A middle ear effusion is present.  Nose: Mucosal edema and rhinorrhea present. Right sinus exhibits maxillary sinus tenderness. Left sinus exhibits maxillary sinus tenderness.  Mouth/Throat: Uvula is midline and mucous membranes are normal. Posterior oropharyngeal erythema present. No oropharyngeal  exudate, posterior oropharyngeal edema or tonsillar abscesses.  Eyes: Pupils are equal, round, and reactive to light. EOM are normal. Right eye exhibits no discharge. Left eye exhibits chemosis. Left eye exhibits no discharge. Left conjunctiva is injected. No scleral icterus. Right eye exhibits normal extraocular motion. Left eye exhibits normal extraocular motion.  Slit lamp exam:      The left eye shows corneal abrasion and fluorescein uptake.    Neck: Normal range of motion. Neck supple.  Cardiovascular: Normal rate, regular rhythm, normal heart sounds and intact distal pulses.   Pulmonary/Chest: Effort normal and breath sounds normal.  Lymphadenopathy:  Head (right side): Submandibular adenopathy present. No preauricular and no posterior auricular adenopathy present.       Head (left side): Submandibular adenopathy present. No preauricular and no posterior auricular adenopathy present.    She has no cervical adenopathy.       Right: No supraclavicular adenopathy present.       Left: No supraclavicular adenopathy present.  Neurological: She is oriented to person, place, and time. She appears lethargic.  Skin: Skin is warm and dry. She is not diaphoretic. No erythema.  Psychiatric: She has a normal mood and affect. Her behavior is normal.       Assessment & Plan:   1. Conjunctival abrasion, left, initial encounter   2. Acute non-recurrent maxillary sinusitis      Meds ordered this encounter  Medications  . amoxicillin-clavulanate (AUGMENTIN) 875-125 MG tablet    Sig: Take 1 tablet by mouth 2 (two) times daily.    Dispense:  20 tablet    Refill:  0  . predniSONE (DELTASONE) 20 MG tablet    Sig: Take 3 tabs qd x 3d, then 2 tabs qd x 3d then 1 tab qd x 3d.    Dispense:  18 tablet    Refill:  0  . ofloxacin (OCUFLOX) 0.3 % ophthalmic solution    Sig: 1-2 drops in the left eye every 30 minutes while awake for 2d, then 1-2 drops every 1 hour for 4d, then 1-2 drops qid until  resolved.    Dispense:  10 mL    Refill:  0    [name], M.D.  Primary Care at [facility] [address] [place], [place] [address] [phone] phone [phone] fax  08/14/17 2:50 PM

## 2017-08-13 NOTE — Patient Instructions (Addendum)
IF you received an x-ray today, you will receive an invoice from [facility]. Please contact [facility] at [phone] with questions or concerns regarding your invoice.   IF you received labwork today, you will receive an invoice from [place]. Please contact [facility] at [phone] with questions or concerns regarding your invoice.   Our billing staff will not be able to assist you with questions regarding bills from these companies.  You will be contacted with the lab results as soon as they are available. The fastest way to get your results is to activate your My Chart account. Instructions are located on the last page of this paperwork. If you have not heard from [place] regarding the results in 2 weeks, please contact this office.     Corneal Abrasion A corneal abrasion is a scratch or injury to the clear covering over the front of your eye (cornea). Your cornea forms a clear dome that protects your eye and helps to focus your vision. Your cornea is made up of many layers. The surface layer is a single layer of cells (corneal epithelium). It is one of the most sensitive tissues in your body. A corneal abrasion can be very painful. If a corneal abrasion is not treated, it can become infected and cause an ulcer. This can lead to scarring. A scarred cornea can affect your vision. Sometimes abrasions come back in the same area, even after the original injury has healed (recurrent erosion syndrome). What are the causes? This condition may be caused by:  A poke in the eye.  A gritty or irritating substance (foreign body) in the eye.  Excessive eye rubbing.  Very dry eyes.  Certain eye infections.  Contact lenses that fit poorly or are worn for a long period of time. You can also injure your cornea when putting contacts lenses in your eye or taking them out.  Eye surgery.  Sometimes, the cause is unknown. What are the signs or symptoms? Symptoms of this  condition include:  Eye pain. The pain may get worse when your eye is open or when you move your eye.  A feeling of something stuck in your eye.  Having trouble keeping your eye open, or not being able to keep it open.  Tearing and redness.  Sensitivity to light.  Blurred vision.  Headache.  How is this diagnosed? This condition may be diagnosed based on:  Your medical history.  Your symptoms.  An eye exam. You may work with a health care provider who specializes in diseases and conditions of the eye (ophthalmologist). Before the eye exam, numbing drops may be put into your eye. You may also have dye put in your eye with a dropper or a small paper strip. The dye makes the abrasion easy to see when your ophthalmologist examines your eye with a light. Your ophthalmologist may look at your eye through an eye scope (slit lamp).  How is this treated? Treatment may vary depending on the cause of your condition, and it may include:  Washing out your eye.  Removing any foreign body.  Antibiotic drops or ointment to treat an infection.  Steroid drops or ointment to treat redness, irritation, or inflammation.  Pain medicine.  An eye patch to keep your eye closed.  Follow these instructions at home: Medicines  Use eye drops or ointments as told by your eye care provider.  If you were prescribed antibiotic drops or ointment, use them as told by your eye care  provider. Do not stop using the antibiotic even if you start to feel better.  Take over-the-counter and prescription medicines only as told by your eye care provider.  Do not drive or use heavy machinery while taking prescription pain medicine. General instructions  If you have an eye patch, wear it as told by your eye care provider. ? Do not drive or use machinery while wearing an eye patch. Your ability to judge distances will be impaired. ? Follow instructions from your eye care provider about when to remove the  patch.  Ask your eye care provider whether you can use a cold, wet cloth (compress) on your eye to relieve pain.  Do not rub or touch your eye. Do not wash out your eye.  Do not wear contact lenses until your eye care provider says that this is okay.  Avoid bright light and eye strain.  Keep all follow-up visits as told by your eye care provider. This is important for preventing infection and vision loss. Contact a health care provider if:  You continue to have eye pain and other symptoms for more than 2 days.  You develop new symptoms, such as redness, tearing, or discharge.  You have discharge that makes your eyelids stick together in the morning.  Your eye patch becomes so loose that you can blink your eye.  Symptoms return after the original abrasion has healed. Get help right away if:  You have severe eye pain that does not get better with medicine.  You have vision loss. Summary  A corneal abrasion is a scratch on the outer layer of the clear covering over the front of your eye (cornea).  Corneal abrasion can cause eye pain, redness, tearing, and blurred vision.  This condition is usually treated with medicine to prevent infection and scarring. You also may have to wear an eye patch to cover your eye.  Let your eye care provider know if your symptoms continue for more than 2 days. This information is not intended to replace advice given to you by your health care provider. Make sure you discuss any questions you have with your health care provider. Document Released: 11/14/2000 Document Revised: 10/28/2016 Document Reviewed: 10/28/2016 Elsevier Interactive Patient Education  2017 [employer].

## 2017-08-17 ENCOUNTER — Ambulatory Visit: Payer: BC Managed Care – PPO | Admitting: Family Medicine

## 2017-09-08 ENCOUNTER — Telehealth: Payer: Self-pay | Admitting: Family Medicine

## 2017-09-08 NOTE — Telephone Encounter (Signed)
Pt is checking on status of her calcuim rx was supposed to be in the pharmacy a week ago but pharmacy has nothing  Best number

## 2017-09-14 NOTE — Telephone Encounter (Signed)
Please advise 

## 2017-09-15 NOTE — Telephone Encounter (Signed)
??    Please clarify. All calcium is otc.  Is she wanting recs on type of calcium?  Vit D rx was sent to [facility] last mo - did it not go through?  Or is she actually needing a refill on citalopram?

## 2017-09-16 ENCOUNTER — Other Ambulatory Visit: Payer: Self-pay

## 2017-09-16 MED ORDER — VITAMIN D (ERGOCALCIFEROL) 1.25 MG (50000 UNIT) PO CAPS: ORAL_CAPSULE | ORAL | 0 refills | Status: DC

## 2017-09-16 NOTE — Telephone Encounter (Signed)
IC pt - Calcium OTC and resent Vit D to pharmacy. Did not need citalopram refilled- see phone / orders only note

## 2017-09-16 NOTE — Progress Notes (Unsigned)
IC patient.  She was not aware Calcium was over the counter.  States pharmacy did not have refills available for her Vitamin D.  Attempted to call pharmacy - unable to reach.  Reordered Vitamin D with note to pharmacy.   Pt did not need refill on Citalopram.

## 2017-10-05 ENCOUNTER — Other Ambulatory Visit: Payer: Self-pay | Admitting: Family Medicine

## 2017-10-23 ENCOUNTER — Ambulatory Visit: Payer: BC Managed Care – PPO | Admitting: Family Medicine

## 2017-10-23 ENCOUNTER — Encounter: Payer: Self-pay | Admitting: Family Medicine

## 2017-10-23 VITALS — BP 175/95 | HR 89 | Temp 98.5°F | Resp 18 | Ht 65.75 in | Wt 199.0 lb

## 2017-10-23 DIAGNOSIS — I10 Essential (primary) hypertension: Secondary | ICD-10-CM

## 2017-10-23 DIAGNOSIS — E559 Vitamin D deficiency, unspecified: Secondary | ICD-10-CM | POA: Diagnosis not present

## 2017-10-23 DIAGNOSIS — R1032 Left lower quadrant pain: Secondary | ICD-10-CM

## 2017-10-23 DIAGNOSIS — E119 Type 2 diabetes mellitus without complications: Secondary | ICD-10-CM | POA: Diagnosis not present

## 2017-10-23 DIAGNOSIS — Z8719 Personal history of other diseases of the digestive system: Secondary | ICD-10-CM | POA: Diagnosis not present

## 2017-10-23 DIAGNOSIS — Z9889 Other specified postprocedural states: Secondary | ICD-10-CM | POA: Diagnosis not present

## 2017-10-23 MED ORDER — LISINOPRIL-HYDROCHLOROTHIAZIDE 20-25 MG PO TABS: 1.0000 | ORAL_TABLET | Freq: Every day | ORAL | 3 refills | Status: DC

## 2017-10-23 MED ORDER — RANITIDINE HCL 150 MG PO TABS: 150.0000 mg | ORAL_TABLET | Freq: Two times a day (BID) | ORAL | 0 refills | Status: DC

## 2017-10-23 MED ORDER — CITALOPRAM HYDROBROMIDE 40 MG PO TABS: 40.0000 mg | ORAL_TABLET | Freq: Every day | ORAL | 3 refills | Status: DC

## 2017-10-23 MED ORDER — DICLOFENAC SODIUM 1 % TD GEL: 2.0000 g | Freq: Four times a day (QID) | TRANSDERMAL | 2 refills | Status: DC

## 2017-10-23 MED ORDER — MELOXICAM 15 MG PO TABS: 15.0000 mg | ORAL_TABLET | Freq: Every day | ORAL | 1 refills | Status: DC

## 2017-10-23 NOTE — Patient Instructions (Addendum)
     IF you received an x-ray today, you will receive an invoice from [facility]. Please contact [facility] at [phone] with questions or concerns regarding your invoice.   IF you received labwork today, you will receive an invoice from [facility]. Please contact [facility] at [phone] with questions or concerns regarding your invoice.   Our billing staff will not be able to assist you with questions regarding bills from these companies.  You will be contacted with the lab results as soon as they are available. The fastest way to get your results is to activate your My Chart account. Instructions are located on the last page of this paperwork. If you have not heard from us regarding the results in 2 weeks, please contact this office.     

## 2017-10-23 NOTE — Progress Notes (Addendum)
Subjective:  By signing my name below, I, [name], attest that this documentation has been prepared under the direction and in the presence of [name], MD Electronically Signed: Ladene Artist, ED Scribe 10/23/2017 at 9:13 AM.   Patient ID: Stacy Hull, female    DOB: [birth year], 47 y.o.   MRN: [id number]  Chief Complaint  Patient presents with  . post-op    here to check on scar; states she notices swellling often   HPI Stacy Hull is a 46 y.o. female who presents to Primary Care at [facility] for a post-op appointment. Vitamin D deficiency 22 3 months prior. A1C was 6.6 3 months prior. Pt is fasting at this visit. States the mesh is comfortable and she has noticed intermittent swelling to her 2 highest scars in her abdomen. She states the corners of the mesh "feels like I'm wearing a meat accordion" with bending and twisting. She also reports regurgitation on either an empty or full stomach after prolonged standing, twisting or bending with increased pressure that causes emesis. Still reports some tenderness, especially on the R side that wraps around into the R flank, with palpation, which is improved at the beginning of the day after she has been laying for several hours. Reports occasional heartburn, indigestion once a week which she occasionally treats with TUMs. She has been treating pain with ibuprofen or Flexeril PRN. She has not tried icing the area. Denies changes to bowels/bladder.  HTN Pt forgot to take her BP medication yesterday but took it approximately 1 hour PTA. She does have a BP cuff but doesn't check her BP outside of the office.   Anxiety/Depression  States she has been having more days where she enjoys doing things since increasing Celexa, however, she wants to figure out how to have more good days. Does report that her mother seems to blame her for her own depression, especially around the holidays, so she has tried to distant herself and her family in  order to keep herself from "going down the rabbit hole with her". One year she didn't go spend Christmas with her mother in [place] so her mother attempted suicide on Christmas and blamed it on Kyra so has some PTSD feelins as well whenever she has to talk to her family.  Doing better this year since she made the decision to not take her family to go visit her parents for the holidays - trying to relax and enjoy. She only takes Trazodone as needed to sleep, which she states is not too often. Denies panic attacks and increased agitation.  Past Medical History:  Diagnosis Date  . Allergic rhinitis   . Anxiety   . Asthma    after URI  . Depression   . GERD (gastroesophageal reflux disease)    takes TUMS  . HTN (hypertension)   . Hyperlipidemia   . Hyperparathyroidism ([place])   . Pneumonia    Current Outpatient Medications on File Prior to Visit  Medication Sig Dispense Refill  . ALPRAZolam (XANAX) 0.5 MG tablet Take 1 tablet (0.5 mg total) by mouth 3 (three) times daily as needed. for anxiety 90 tablet 1  . citalopram (CELEXA) 40 MG tablet Take 1 tablet (40 mg total) daily by mouth. Office visit needed for refills 30 tablet 0  . cyclobenzaprine (FLEXERIL) 10 MG tablet Take 1 tablet (10 mg total) by mouth 3 (three) times daily as needed for muscle spasms. 30 tablet 2  . ibuprofen (ADVIL,MOTRIN) 800 MG tablet  Take 1 tablet (800 mg total) by mouth every 8 (eight) hours as needed for mild pain. 60 tablet 2  . lisinopril-hydrochlorothiazide (PRINZIDE,ZESTORETIC) 20-25 MG tablet Take 1 tablet by mouth daily. 90 tablet 3  . Multiple Vitamin (MULTIVITAMIN WITH MINERALS) TABS tablet Take 1 tablet by mouth daily.    . traZODone (DESYREL) 50 MG tablet Take 0.5-1 tablets (25-50 mg total) by mouth at bedtime as needed for sleep. 30 tablet 3  . Vitamin D, Ergocalciferol, (DRISDOL) 50000 units CAPS capsule TAKE ONE CAPSULE BY MOUTH EVERY 7 DAYS 24 capsule 0   No current facility-administered medications  on file prior to visit.    Allergies  Allergen Reactions  . Erythromycin Other (See Comments)    Reaction:  GI upset    Past Surgical History:  Procedure Laterality Date  . CERVICAL CONE BIOPSY    . CESAREAN SECTION  2005  . HYSTEROSCOPY WITH NOVASURE  11/26/2012   Procedure: HYSTEROSCOPY WITH NOVASURE;  Surgeon: Marvene Staff, MD;  Location: [facility];  Service: Gynecology;  Laterality: N/A;  . LAPAROSCOPIC LYSIS OF ADHESIONS N/A 11/21/2016   Procedure: EXTENSIVE LAPAROSCOPIC LYSIS OF ADHESIONS;  Surgeon: Servando Salina, MD;  Location: [facility];  Service: Gynecology;  Laterality: N/A;  . LAPAROSCOPY N/A 11/21/2016   Procedure: LAPAROSCOPY DIAGNOSTIC, LAPAROSCOPIC REMOVAL OF FOREIGN BODY;  Surgeon: Ralene Ok, MD;  Location: [facility];  Service: General;  Laterality: N/A;  . PARATHYROIDECTOMY  1999   benign  . REPAIR VAGINAL CUFF N/A 12/14/2016   Procedure: REPAIR VAGINAL CUFF;  Surgeon: Brien Few, MD;  Location: [facility];  Service: Gynecology;  Laterality: N/A;  . ROBOTIC ASSISTED LAPAROSCOPIC OVARIAN CYSTECTOMY Right 10/01/2015   Procedure: ROBOTIC ASSISTED LAPAROSCOPIC BILATERAL SALPINGECTOMY WITH LYSIS OF ADHESIONS AND RIGHT ADNEXAL CYST;  Surgeon: Princess Bruins, MD;  Location: [facility];  Service: Gynecology;  Laterality: Right;  POSSIBLE robot .  DO NOT DRAPE THE ROBOT.....she will use the laparoscopic camera to start.    . ROBOTIC ASSISTED TOTAL HYSTERECTOMY WITH SALPINGECTOMY N/A 11/21/2016   Procedure: ROBOTIC ASSISTED TOTAL HYSTERECTOMY WITH  EXTENSIVE LYSIS OF ADHESIONS;  Surgeon: Servando Salina, MD;  Location: [facility];  Service: Gynecology;  Laterality: N/A;  . TUBAL LIGATION    . UMBILICAL HERNIA REPAIR    . WISDOM TOOTH EXTRACTION     Family History  Problem Relation Age of Onset  . Depression Mother   . Hypertension Father   . Aneurysm Father 58       cerebral  . Diabetes Maternal Grandmother   . Diabetes Maternal Grandfather   . Stroke Paternal  Grandfather 30   Social History   Socioeconomic History  . Marital status: Married    Spouse name: Latise Dilley  . Number of children: 2  . Years of education: Master's +  . Highest education level: None  Social Needs  . Financial resource strain: None  . Food insecurity - worry: None  . Food insecurity - inability: None  . Transportation needs - medical: None  . Transportation needs - non-medical: None  Occupational History  . Occupation: HIGH SCHOOL ENGLISH     Employer: [employer]    Comment: [employer]  Tobacco Use  . Smoking status: Never Smoker  . Smokeless tobacco: Never Used  Substance and Sexual Activity  . Alcohol use: Yes    Alcohol/week: 2.0 oz    Types: 4 Standard drinks or equivalent per week    Comment: occasionally  . Drug use: No  .  Sexual activity: Yes    Partners: Male    Birth control/protection: Surgical  Other Topics Concern  . None  Social History Narrative   Lives with her husband and their 2 children. Walks 3x's weekly for exercise.   Depression screen [facility] 2/9 10/23/2017 08/13/2017 07/20/2017 04/28/2017 04/17/2017  Decreased Interest 0 0 3 0 0  Down, Depressed, Hopeless 0 0 2 0 0  PHQ - 2 Score 0 0 5 0 0  Altered sleeping - 2 3 - -  Tired, decreased energy - 2 3 - -  Change in appetite - 0 1 - -  Feeling bad or failure about yourself  - 0 1 - -  Trouble concentrating - 0 0 - -  Moving slowly or fidgety/restless - 0 1 - -  Suicidal thoughts - 0 1 - -  PHQ-9 Score - 4 15 - -  Difficult doing work/chores - - Somewhat difficult - -    Review of Systems  Gastrointestinal: Positive for abdominal pain and vomiting. Negative for constipation and diarrhea.  Genitourinary: Negative for difficulty urinating and hematuria.  Psychiatric/Behavioral: Negative for agitation.      Objective:   Physical Exam  Constitutional: She is oriented to person, place, and time. She appears well-developed and well-nourished. No  distress.  HENT:  Head: Normocephalic and atraumatic.  Eyes: Conjunctivae and EOM are normal.  Neck: Neck supple. No tracheal deviation present.  Cardiovascular: Normal rate, regular rhythm and normal heart sounds.  Pulmonary/Chest: Effort normal and breath sounds normal. No respiratory distress.  Lungs are clear  Abdominal: There is tenderness.  Tenderness to the R of the umbilicus. Diastasis recti that is small.  Musculoskeletal: Normal range of motion.  Neurological: She is alert and oriented to person, place, and time.  Skin: Skin is warm and dry.  Psychiatric: She has a normal mood and affect. Her behavior is normal.  Nursing note and vitals reviewed.  Vitals:   10/23/17 0855 10/23/17 0940  BP: (!) 162/91 (!) 175/95  Pulse: 89   Resp: 18   Temp: 98.5 F (36.9 C)   TempSrc: Oral   SpO2: 96%   Weight: 199 lb (90.3 kg)   Height: 5' 5.75" (1.67 m)    Assessment & Plan:   1. Type 2 diabetes mellitus without complication, without long-term current use of insulin ([place]) - new diagnosis 07/20/17 w/ a1c 6.6 - recheck. Perhaps elev sugars could be worsening surgical inflammation??  Lab Results  Component Value Date   HGBA1C 6.8 (H) 10/23/2017   HGBA1C 6.6 (H) 07/20/2017   HGBA1C 5.6 07/21/2013      2. Vitamin D deficiency - still low 3 mos ago at 34. Cont high dose once wkly vit D supp for another 3 mos, then change to otc vit D ~2000u/d.   3. Essential hypertension - missed bp pill yest and just took today right before visit - also thanksgiving yest so very high salt diet. Pt denies any prob w/ med compliance usually and prior BP all well-controlled so recheck at next OV and at home. Cont lisinopril-hctz 20-25.  4. Abdominal wall pain in left lower quadrant   5. Hx of ventral hernia repair - appears she is still having some inflammation around the surgical port sites though she is now almost 1 yr out from surgery and has the freq sensation of mesh pressing against her rib  cage/diaphragm. Encouraged pt to make a f/u appt w/ her surgeon to discuss. Has cell phone pics that do  clearly demonstrate a well defined edematous plaque - almost like urticaria - at port sites. Try anti-inflammatory measures - ice, topical nsaids (voltaren gel), sched zantac, scheduled nsaids x 4-6 wks (start meloxicam).  Check inflammatory markers.  6.      Depression - cont celexa 40 - doing better. Discussed trying to change to lexapro 20 but pt feels current regimen is adequate. Distant h/o lexapro trial - thinks it worked ok.  Orders Placed This Encounter  Procedures  . Sedimentation Rate  . C-reactive protein  . Hemoglobin A1c    Meds ordered this encounter  Medications  . citalopram (CELEXA) 40 MG tablet    Sig: Take 1 tablet (40 mg total) by mouth daily.    Dispense:  90 tablet    Refill:  3  . lisinopril-hydrochlorothiazide (PRINZIDE,ZESTORETIC) 20-25 MG tablet    Sig: Take 1 tablet by mouth daily.    Dispense:  90 tablet    Refill:  3  . meloxicam (MOBIC) 15 MG tablet    Sig: Take 1 tablet (15 mg total) by mouth daily.    Dispense:  30 tablet    Refill:  1  . ranitidine (ZANTAC) 150 MG tablet    Sig: Take 1 tablet (150 mg total) by mouth 2 (two) times daily.    Dispense:  180 tablet    Refill:  0  . diclofenac sodium (VOLTAREN) 1 % GEL    Sig: Apply 2 g topically 4 (four) times daily. To surgical sites    Dispense:  100 g    Refill:  2   Over 50 min spent in face-to-face evaluation of and consultation with patient and coordination of care.  Over 50% of this time was spent counseling this patient regarding discomfort from prior hernia repair and hernia mesh - current options to address concerns.  Also depression which often worsens over the holidays due to mother and the inevitable interactions. . .. Marland Kitchen  I personally performed the services described in this documentation, which was scribed in my presence. The recorded information has been reviewed and considered, and  addended by me as needed.   [name], M.D.  Primary Care at [facility] [address] [place], [place] [address] [phone] phone [phone] fax  10/25/17 7:24 AM

## 2017-10-24 LAB — SEDIMENTATION RATE: Sed Rate: 13 mm/hr (ref 0–32)

## 2017-10-24 LAB — HEMOGLOBIN A1C
ESTIMATED AVERAGE GLUCOSE: 148 mg/dL
Hgb A1c MFr Bld: 6.8 % — ABNORMAL HIGH (ref 4.8–5.6)

## 2017-10-24 LAB — C-REACTIVE PROTEIN: CRP: 4.4 mg/L (ref 0.0–4.9)

## 2017-10-25 ENCOUNTER — Encounter: Payer: Self-pay | Admitting: Family Medicine

## 2017-10-25 NOTE — Addendum Note (Signed)
Addended by: Shawnee Knapp on: 10/25/2017 07:54 AM   Modules accepted: Orders

## 2017-10-26 ENCOUNTER — Telehealth: Payer: Self-pay

## 2017-10-26 NOTE — Telephone Encounter (Signed)
Received denial letter from [facility] [employer] stating that diclofenac gel is only approved through patient's plan for joint pain.  Left detailed message advising patient of this, along with my direct line for her to CB.  If she is indeed suffering from joint pain, I will happily resubmit the claim. Awaiting call back.

## 2017-10-26 NOTE — Telephone Encounter (Signed)
Request for Diclofenac Gel sent to plan today.  Key code is [id number].  Should have results within 72 Business hours.

## 2017-11-18 ENCOUNTER — Ambulatory Visit: Payer: BC Managed Care – PPO | Admitting: Emergency Medicine

## 2017-11-18 ENCOUNTER — Encounter: Payer: Self-pay | Admitting: Emergency Medicine

## 2017-11-18 ENCOUNTER — Other Ambulatory Visit: Payer: Self-pay

## 2017-11-18 VITALS — BP 132/84 | HR 93 | Temp 98.8°F | Resp 16 | Ht 66.25 in | Wt 193.2 lb

## 2017-11-18 DIAGNOSIS — J0100 Acute maxillary sinusitis, unspecified: Secondary | ICD-10-CM | POA: Diagnosis not present

## 2017-11-18 DIAGNOSIS — R0981 Nasal congestion: Secondary | ICD-10-CM | POA: Diagnosis not present

## 2017-11-18 MED ORDER — AMOXICILLIN-POT CLAVULANATE 875-125 MG PO TABS: 1.0000 | ORAL_TABLET | Freq: Two times a day (BID) | ORAL | 0 refills | Status: AC

## 2017-11-18 MED ORDER — TRIAMCINOLONE ACETONIDE 55 MCG/ACT NA AERO: 2.0000 | INHALATION_SPRAY | Freq: Every day | NASAL | 12 refills | Status: DC

## 2017-11-18 NOTE — Patient Instructions (Addendum)
     IF you received an x-ray today, you will receive an invoice from [facility]. Please contact [facility] at [phone] with questions or concerns regarding your invoice.   IF you received labwork today, you will receive an invoice from [facility]. Please contact [facility] at [phone] with questions or concerns regarding your invoice.   Our billing staff will not be able to assist you with questions regarding bills from these companies.  You will be contacted with the lab results as soon as they are available. The fastest way to get your results is to activate your My Chart account. Instructions are located on the last page of this paperwork. If you have not heard from us regarding the results in 2 weeks, please contact this office.     Sinusitis, Adult Sinusitis is soreness and inflammation of your sinuses. Sinuses are hollow spaces in the bones around your face. They are located:  Around your eyes.  In the middle of your forehead.  Behind your nose.  In your cheekbones.  Your sinuses and nasal passages are lined with a stringy fluid (mucus). Mucus normally drains out of your sinuses. When your nasal tissues get inflamed or swollen, the mucus can get trapped or blocked so air cannot flow through your sinuses. This lets bacteria, viruses, and funguses grow, and that leads to infection. Follow these instructions at home: Medicines  Take, use, or apply over-the-counter and prescription medicines only as told by your doctor. These may include nasal sprays.  If you were prescribed an antibiotic medicine, take it as told by your doctor. Do not stop taking the antibiotic even if you start to feel better. Hydrate and Humidify  Drink enough water to keep your pee (urine) clear or pale yellow.  Use a cool mist humidifier to keep the humidity level in your home above 50%.  Breathe in steam for 10-15 minutes, 3-4 times a day or as told by your doctor. You can do  this in the bathroom while a hot shower is running.  Try not to spend time in cool or dry air. Rest  Rest as much as possible.  Sleep with your head raised (elevated).  Make sure to get enough sleep each night. General instructions  Put a warm, moist washcloth on your face 3-4 times a day or as told by your doctor. This will help with discomfort.  Wash your hands often with soap and water. If there is no soap and water, use hand sanitizer.  Do not smoke. Avoid being around people who are smoking (secondhand smoke).  Keep all follow-up visits as told by your doctor. This is important. Contact a doctor if:  You have a fever.  Your symptoms get worse.  Your symptoms do not get better within 10 days. Get help right away if:  You have a very bad headache.  You cannot stop throwing up (vomiting).  You have pain or swelling around your face or eyes.  You have trouble seeing.  You feel confused.  Your neck is stiff.  You have trouble breathing. This information is not intended to replace advice given to you by your health care provider. Make sure you discuss any questions you have with your health care provider. Document Released: 05/05/2008 Document Revised: 07/13/2016 Document Reviewed: 09/12/2015 Elsevier Interactive Patient Education  2018 [employer]  

## 2017-11-18 NOTE — Progress Notes (Signed)
Stacy Hull 46 y.o.   Chief Complaint  Patient presents with  . Sinus Problem    per patient with pain x 3 days  . Sore Throat    HISTORY OF PRESENT ILLNESS: This is a 46 y.o. female complaining of sore throat and sinus congestion x 3 days.  Sinus Problem  This is a new problem. The current episode started in the past 7 days. The problem has been gradually worsening since onset. There has been no fever. The pain is moderate. Associated symptoms include congestion, coughing, ear pain, headaches, sinus pressure and a sore throat. Pertinent negatives include no chills, neck pain, shortness of breath or swollen glands. Treatments tried: ibuprofen. The treatment provided mild relief.     Prior to Admission medications   Medication Sig Start Date End Date Taking? Authorizing Provider  ALPRAZolam Duanne Moron) 0.5 MG tablet Take 1 tablet (0.5 mg total) by mouth 3 (three) times daily as needed. for anxiety 07/20/17  Yes Shawnee Knapp, MD  citalopram (CELEXA) 40 MG tablet Take 1 tablet (40 mg total) by mouth daily. 10/23/17  Yes Shawnee Knapp, MD  cyclobenzaprine (FLEXERIL) 10 MG tablet Take 1 tablet (10 mg total) by mouth 3 (three) times daily as needed for muscle spasms. 07/20/17  Yes Shawnee Knapp, MD  ibuprofen (ADVIL,MOTRIN) 800 MG tablet Take 1 tablet (800 mg total) by mouth every 8 (eight) hours as needed for mild pain. 07/20/17  Yes Shawnee Knapp, MD  lisinopril-hydrochlorothiazide (PRINZIDE,ZESTORETIC) 20-25 MG tablet Take 1 tablet by mouth daily. 10/23/17  Yes Shawnee Knapp, MD  Multiple Vitamin (MULTIVITAMIN WITH MINERALS) TABS tablet Take 1 tablet by mouth daily.   Yes [provider]  ranitidine (ZANTAC) 150 MG tablet Take 1 tablet (150 mg total) by mouth 2 (two) times daily. 10/23/17  Yes Shawnee Knapp, MD  traZODone (DESYREL) 50 MG tablet Take 0.5-1 tablets (25-50 mg total) by mouth at bedtime as needed for sleep. 07/20/17  Yes Shawnee Knapp, MD  Vitamin D, Ergocalciferol, (DRISDOL) 50000  units CAPS capsule TAKE ONE CAPSULE BY MOUTH EVERY 7 DAYS 09/16/17  Yes Shawnee Knapp, MD  diclofenac sodium (VOLTAREN) 1 % GEL Apply 2 g topically 4 (four) times daily. To surgical sites Patient not taking: Reported on 11/18/2017 10/23/17   Shawnee Knapp, MD  meloxicam (MOBIC) 15 MG tablet Take 1 tablet (15 mg total) by mouth daily. Patient not taking: Reported on 11/18/2017 10/23/17   Shawnee Knapp, MD    Allergies  Allergen Reactions  . Erythromycin Other (See Comments)    Reaction:  GI upset     Patient Active Problem List   Diagnosis Date Noted  . S/P hysterectomy 11/21/2016  . Vitamin D deficiency 05/12/2016  . Anxiety state 09/27/2007  . Hyperlipidemia 03/18/2007  . Depression 03/18/2007  . Essential hypertension 03/18/2007    Past Medical History:  Diagnosis Date  . Allergic rhinitis   . Anxiety   . Asthma    after URI  . Depression   . GERD (gastroesophageal reflux disease)    takes TUMS  . HTN (hypertension)   . Hyperlipidemia   . Hyperparathyroidism ([place])   . Pneumonia     Past Surgical History:  Procedure Laterality Date  . CERVICAL CONE BIOPSY    . CESAREAN SECTION  2005  . HYSTEROSCOPY WITH NOVASURE  11/26/2012   Procedure: HYSTEROSCOPY WITH NOVASURE;  Surgeon: Marvene Staff, MD;  Location: [facility];  Service: Gynecology;  Laterality: N/A;  . LAPAROSCOPIC LYSIS OF ADHESIONS N/A 11/21/2016   Procedure: EXTENSIVE LAPAROSCOPIC LYSIS OF ADHESIONS;  Surgeon: Servando Salina, MD;  Location: [facility];  Service: Gynecology;  Laterality: N/A;  . LAPAROSCOPY N/A 11/21/2016   Procedure: LAPAROSCOPY DIAGNOSTIC, LAPAROSCOPIC REMOVAL OF FOREIGN BODY;  Surgeon: Ralene Ok, MD;  Location: [facility];  Service: General;  Laterality: N/A;  . PARATHYROIDECTOMY  1999   benign  . REPAIR VAGINAL CUFF N/A 12/14/2016   Procedure: REPAIR VAGINAL CUFF;  Surgeon: Brien Few, MD;  Location: [facility];  Service: Gynecology;  Laterality: N/A;  . ROBOTIC ASSISTED LAPAROSCOPIC  OVARIAN CYSTECTOMY Right 10/01/2015   Procedure: ROBOTIC ASSISTED LAPAROSCOPIC BILATERAL SALPINGECTOMY WITH LYSIS OF ADHESIONS AND RIGHT ADNEXAL CYST;  Surgeon: Princess Bruins, MD;  Location: [facility];  Service: Gynecology;  Laterality: Right;  POSSIBLE robot .  DO NOT DRAPE THE ROBOT.....she will use the laparoscopic camera to start.    . ROBOTIC ASSISTED TOTAL HYSTERECTOMY WITH SALPINGECTOMY N/A 11/21/2016   Procedure: ROBOTIC ASSISTED TOTAL HYSTERECTOMY WITH  EXTENSIVE LYSIS OF ADHESIONS;  Surgeon: Servando Salina, MD;  Location: [facility];  Service: Gynecology;  Laterality: N/A;  . TUBAL LIGATION    . UMBILICAL HERNIA REPAIR    . WISDOM TOOTH EXTRACTION      Social History   Socioeconomic History  . Marital status: Married    Spouse name: Beza Steppe  . Number of children: 2  . Years of education: Master's +  . Highest education level: Not on file  Social Needs  . Financial resource strain: Not on file  . Food insecurity - worry: Not on file  . Food insecurity - inability: Not on file  . Transportation needs - medical: Not on file  . Transportation needs - non-medical: Not on file  Occupational History  . Occupation: HIGH SCHOOL ENGLISH     Employer: [employer]    Comment: [employer]  Tobacco Use  . Smoking status: Never Smoker  . Smokeless tobacco: Never Used  Substance and Sexual Activity  . Alcohol use: Yes    Alcohol/week: 2.0 oz    Types: 4 Standard drinks or equivalent per week    Comment: occasionally  . Drug use: No  . Sexual activity: Yes    Partners: Male    Birth control/protection: Surgical  Other Topics Concern  . Not on file  Social History Narrative   Lives with her husband and their 2 children. Walks 3x's weekly for exercise.    Family History  Problem Relation Age of Onset  . Depression Mother   . Hypertension Father   . Aneurysm Father 45       cerebral  . Diabetes Maternal Grandmother   . Diabetes Maternal  Grandfather   . Stroke Paternal Grandfather 50     Review of Systems  Constitutional: Negative.  Negative for chills.  HENT: Positive for congestion, ear pain, sinus pressure, sinus pain and sore throat. Negative for nosebleeds.   Eyes: Negative.   Respiratory: Positive for cough. Negative for sputum production, shortness of breath and wheezing.   Cardiovascular: Negative for chest pain and palpitations.  Gastrointestinal: Negative.  Negative for abdominal pain, diarrhea, nausea and vomiting.  Genitourinary: Negative.  Negative for dysuria and hematuria.  Musculoskeletal: Negative for joint pain, myalgias and neck pain.  Skin: Negative for rash.  Neurological: Positive for headaches. Negative for dizziness, sensory change and focal weakness.  Endo/Heme/Allergies: Negative.   All other systems reviewed and are negative.  Vitals:   11/18/17 1056  BP: 132/84  Pulse: 93  Resp: 16  Temp: 98.8 F (37.1 C)  SpO2: 98%    Physical Exam  Constitutional: She is oriented to person, place, and time. She appears well-developed and well-nourished.  HENT:  Head: Normocephalic and atraumatic.  Right Ear: Tympanic membrane, external ear and ear canal normal.  Left Ear: Tympanic membrane, external ear and ear canal normal.  Nose: Mucosal edema present. Right sinus exhibits maxillary sinus tenderness. Left sinus exhibits maxillary sinus tenderness.  Mouth/Throat: Uvula is midline and mucous membranes are normal. No uvula swelling. Posterior oropharyngeal erythema present. No oropharyngeal exudate, posterior oropharyngeal edema or tonsillar abscesses.  Eyes: EOM are normal. Pupils are equal, round, and reactive to light.  Neck: Normal range of motion. Neck supple.  Cardiovascular: Normal rate, regular rhythm and normal heart sounds.  Pulmonary/Chest: Effort normal and breath sounds normal.  Abdominal: Soft. There is no tenderness.  Musculoskeletal: Normal range of motion.  Lymphadenopathy:     She has no cervical adenopathy.  Neurological: She is alert and oriented to person, place, and time. No sensory deficit. She exhibits normal muscle tone.  Skin: Skin is warm and dry. Capillary refill takes less than 2 seconds. No rash noted.  Psychiatric: She has a normal mood and affect. Her behavior is normal.  Vitals reviewed.    ASSESSMENT & PLAN: Nayra was seen today for sinus problem and sore throat.  Diagnoses and all orders for this visit:  Sinus congestion -     triamcinolone (NASACORT) 55 MCG/ACT AERO nasal inhaler; Place 2 sprays into the nose daily.  Acute non-recurrent maxillary sinusitis -     amoxicillin-clavulanate (AUGMENTIN) 875-125 MG tablet; Take 1 tablet by mouth 2 (two) times daily for 7 days.    Patient Instructions       IF you received an x-ray today, you will receive an invoice from [facility]. Please contact [facility] at [phone] with questions or concerns regarding your invoice.   IF you received labwork today, you will receive an invoice from [facility]. Please contact [facility] at [phone] with questions or concerns regarding your invoice.   Our billing staff will not be able to assist you with questions regarding bills from these companies.  You will be contacted with the lab results as soon as they are available. The fastest way to get your results is to activate your My Chart account. Instructions are located on the last page of this paperwork. If you have not heard from [place] regarding the results in 2 weeks, please contact this office.     Sinusitis, Adult Sinusitis is soreness and inflammation of your sinuses. Sinuses are hollow spaces in the bones around your face. They are located:  Around your eyes.  In the middle of your forehead.  Behind your nose.  In your cheekbones.  Your sinuses and nasal passages are lined with a stringy fluid (mucus). Mucus normally drains out of your sinuses. When your nasal  tissues get inflamed or swollen, the mucus can get trapped or blocked so air cannot flow through your sinuses. This lets bacteria, viruses, and funguses grow, and that leads to infection. Follow these instructions at home: Medicines  Take, use, or apply over-the-counter and prescription medicines only as told by your doctor. These may include nasal sprays.  If you were prescribed an antibiotic medicine, take it as told by your doctor. Do not stop taking the antibiotic even if you start to feel better. Hydrate and  Humidify  Drink enough water to keep your pee (urine) clear or pale yellow.  Use a cool mist humidifier to keep the humidity level in your home above 50%.  Breathe in steam for 10-15 minutes, 3-4 times a day or as told by your doctor. You can do this in the bathroom while a hot shower is running.  Try not to spend time in cool or dry air. Rest  Rest as much as possible.  Sleep with your head raised (elevated).  Make sure to get enough sleep each night. General instructions  Put a warm, moist washcloth on your face 3-4 times a day or as told by your doctor. This will help with discomfort.  Wash your hands often with soap and water. If there is no soap and water, use hand sanitizer.  Do not smoke. Avoid being around people who are smoking (secondhand smoke).  Keep all follow-up visits as told by your doctor. This is important. Contact a doctor if:  You have a fever.  Your symptoms get worse.  Your symptoms do not get better within 10 days. Get help right away if:  You have a very bad headache.  You cannot stop throwing up (vomiting).  You have pain or swelling around your face or eyes.  You have trouble seeing.  You feel confused.  Your neck is stiff.  You have trouble breathing. This information is not intended to replace advice given to you by your health care provider. Make sure you discuss any questions you have with your health care  provider. Document Released: 05/05/2008 Document Revised: 07/13/2016 Document Reviewed: 09/12/2015 Elsevier Interactive Patient Education  2018 Elsevier Inc.      Agustina Caroli, MD [facility]

## 2017-11-19 ENCOUNTER — Ambulatory Visit: Payer: BC Managed Care – PPO

## 2017-11-26 ENCOUNTER — Ambulatory Visit: Payer: BC Managed Care – PPO

## 2017-12-03 ENCOUNTER — Ambulatory Visit: Payer: BC Managed Care – PPO

## 2017-12-05 ENCOUNTER — Other Ambulatory Visit: Payer: Self-pay

## 2017-12-05 ENCOUNTER — Encounter: Payer: Self-pay | Admitting: Family Medicine

## 2017-12-05 ENCOUNTER — Ambulatory Visit: Payer: BC Managed Care – PPO | Admitting: Family Medicine

## 2017-12-05 ENCOUNTER — Telehealth: Payer: Self-pay | Admitting: Family Medicine

## 2017-12-05 VITALS — BP 128/86 | HR 95 | Temp 98.0°F | Resp 16 | Ht 66.25 in | Wt 194.2 lb

## 2017-12-05 DIAGNOSIS — E559 Vitamin D deficiency, unspecified: Secondary | ICD-10-CM | POA: Diagnosis not present

## 2017-12-05 DIAGNOSIS — R7309 Other abnormal glucose: Secondary | ICD-10-CM | POA: Diagnosis not present

## 2017-12-05 DIAGNOSIS — Z119 Encounter for screening for infectious and parasitic diseases, unspecified: Secondary | ICD-10-CM

## 2017-12-05 LAB — POCT GLYCOSYLATED HEMOGLOBIN (HGB A1C): Hemoglobin A1C: 6.6

## 2017-12-05 NOTE — Telephone Encounter (Signed)
Patient left clinic on Saturday 12/05/17 without leaving urine sample for Microalbumin/Creatinine ratio. Patient was called by Leontine Locket, CMA to return to clinic but patient did not return by end of clinic day.  Please call patient to have her come back in to leave urine sample.

## 2017-12-05 NOTE — Progress Notes (Signed)
Subjective:  By signing my name below, I, Stacy Hull, attest that this documentation has been prepared under the direction and in the presence of [name], MD Electronically Signed: Ladene Artist, ED Scribe 12/05/2017 at 8:55 AM.   Patient ID: Stacy Hull, female    DOB: September 04, 1971, 47 y.o.   MRN: [id number]  Chief Complaint  Patient presents with  . Elevated A1C    Patient presents to office for recheck of her A1C, states that it was elevated at last visit   HPI Stacy Hull is a 48 y.o. female who presents to Primary Care at [facility] for recheck of her A1C. Pt states that she has joined a gym and has cut out [place] from her diet since her last visit. Also states that her GI symptoms have resolved since changing her diet as well. Has also noticed that she has been sleeping well.  Wt Readings from Last 3 Encounters:  12/05/17 194 lb 3.2 oz (88.1 kg)  11/18/17 193 lb 3.2 oz (87.6 kg)  10/23/17 199 lb (90.3 kg)   Past Medical History:  Diagnosis Date  . Allergic rhinitis   . Anxiety   . Asthma    after URI  . Depression   . GERD (gastroesophageal reflux disease)    takes TUMS  . HTN (hypertension)   . Hyperlipidemia   . Hyperparathyroidism ([place])   . Pneumonia    Current Outpatient Medications on File Prior to Visit  Medication Sig Dispense Refill  . ALPRAZolam (XANAX) 0.5 MG tablet Take 1 tablet (0.5 mg total) by mouth 3 (three) times daily as needed. for anxiety 90 tablet 1  . citalopram (CELEXA) 40 MG tablet Take 1 tablet (40 mg total) by mouth daily. 90 tablet 3  . cyclobenzaprine (FLEXERIL) 10 MG tablet Take 1 tablet (10 mg total) by mouth 3 (three) times daily as needed for muscle spasms. 30 tablet 2  . diclofenac sodium (VOLTAREN) 1 % GEL Apply 2 g topically 4 (four) times daily. To surgical sites (Patient not taking: Reported on 11/18/2017) 100 g 2  . ibuprofen (ADVIL,MOTRIN) 800 MG tablet Take 1 tablet (800 mg total) by mouth every 8 (eight) hours as  needed for mild pain. 60 tablet 2  . lisinopril-hydrochlorothiazide (PRINZIDE,ZESTORETIC) 20-25 MG tablet Take 1 tablet by mouth daily. 90 tablet 3  . meloxicam (MOBIC) 15 MG tablet Take 1 tablet (15 mg total) by mouth daily. (Patient not taking: Reported on 11/18/2017) 30 tablet 1  . Multiple Vitamin (MULTIVITAMIN WITH MINERALS) TABS tablet Take 1 tablet by mouth daily.    . ranitidine (ZANTAC) 150 MG tablet Take 1 tablet (150 mg total) by mouth 2 (two) times daily. 180 tablet 0  . traZODone (DESYREL) 50 MG tablet Take 0.5-1 tablets (25-50 mg total) by mouth at bedtime as needed for sleep. 30 tablet 3  . triamcinolone (NASACORT) 55 MCG/ACT AERO nasal inhaler Place 2 sprays into the nose daily. 1 Inhaler 12  . Vitamin D, Ergocalciferol, (DRISDOL) 50000 units CAPS capsule TAKE ONE CAPSULE BY MOUTH EVERY 7 DAYS 24 capsule 0   No current facility-administered medications on file prior to visit.    Allergies  Allergen Reactions  . Erythromycin Other (See Comments)    Reaction:  GI upset    Past Surgical History:  Procedure Laterality Date  . CERVICAL CONE BIOPSY    . CESAREAN SECTION  2005  . HYSTEROSCOPY WITH NOVASURE  11/26/2012   Procedure: HYSTEROSCOPY WITH NOVASURE;  Surgeon: Alanda Slim A  Garwin Brothers, MD;  Location: [facility];  Service: Gynecology;  Laterality: N/A;  . LAPAROSCOPIC LYSIS OF ADHESIONS N/A 11/21/2016   Procedure: EXTENSIVE LAPAROSCOPIC LYSIS OF ADHESIONS;  Surgeon: Servando Salina, MD;  Location: [facility];  Service: Gynecology;  Laterality: N/A;  . LAPAROSCOPY N/A 11/21/2016   Procedure: LAPAROSCOPY DIAGNOSTIC, LAPAROSCOPIC REMOVAL OF FOREIGN BODY;  Surgeon: Ralene Ok, MD;  Location: [facility];  Service: General;  Laterality: N/A;  . PARATHYROIDECTOMY  1999   benign  . REPAIR VAGINAL CUFF N/A 12/14/2016   Procedure: REPAIR VAGINAL CUFF;  Surgeon: Brien Few, MD;  Location: [facility];  Service: Gynecology;  Laterality: N/A;  . ROBOTIC ASSISTED LAPAROSCOPIC OVARIAN CYSTECTOMY  Right 10/01/2015   Procedure: ROBOTIC ASSISTED LAPAROSCOPIC BILATERAL SALPINGECTOMY WITH LYSIS OF ADHESIONS AND RIGHT ADNEXAL CYST;  Surgeon: Princess Bruins, MD;  Location: [facility];  Service: Gynecology;  Laterality: Right;  POSSIBLE robot .  DO NOT DRAPE THE ROBOT.....she will use the laparoscopic camera to start.    . ROBOTIC ASSISTED TOTAL HYSTERECTOMY WITH SALPINGECTOMY N/A 11/21/2016   Procedure: ROBOTIC ASSISTED TOTAL HYSTERECTOMY WITH  EXTENSIVE LYSIS OF ADHESIONS;  Surgeon: Servando Salina, MD;  Location: [facility];  Service: Gynecology;  Laterality: N/A;  . TUBAL LIGATION    . UMBILICAL HERNIA REPAIR    . WISDOM TOOTH EXTRACTION     Family History  Problem Relation Age of Onset  . Depression Mother   . Hypertension Father   . Aneurysm Father 69       cerebral  . Diabetes Maternal Grandmother   . Diabetes Maternal Grandfather   . Stroke Paternal Grandfather 72   Social History   Socioeconomic History  . Marital status: Married    Spouse name: Martrice Apt  . Number of children: 2  . Years of education: Master's +  . Highest education level: None  Social Needs  . Financial resource strain: None  . Food insecurity - worry: None  . Food insecurity - inability: None  . Transportation needs - medical: None  . Transportation needs - non-medical: None  Occupational History  . Occupation: HIGH SCHOOL ENGLISH     Employer: [employer]    Comment: [employer]  Tobacco Use  . Smoking status: Never Smoker  . Smokeless tobacco: Never Used  Substance and Sexual Activity  . Alcohol use: Yes    Alcohol/week: 2.0 oz    Types: 4 Standard drinks or equivalent per week    Comment: occasionally  . Drug use: No  . Sexual activity: Yes    Partners: Male    Birth control/protection: Surgical  Other Topics Concern  . None  Social History Narrative   Lives with her husband and their 2 children. Walks 3x's weekly for exercise.   Depression screen  [facility] 2/9 12/05/2017 11/18/2017 10/23/2017 08/13/2017 07/20/2017  Decreased Interest 0 0 0 0 3  Down, Depressed, Hopeless 0 0 0 0 2  PHQ - 2 Score 0 0 0 0 5  Altered sleeping - - - 2 3  Tired, decreased energy - - - 2 3  Change in appetite - - - 0 1  Feeling bad or failure about yourself  - - - 0 1  Trouble concentrating - - - 0 0  Moving slowly or fidgety/restless - - - 0 1  Suicidal thoughts - - - 0 1  PHQ-9 Score - - - 4 15  Difficult doing work/chores - - - - Somewhat difficult    Review of Systems  Gastrointestinal: Negative for abdominal distention and abdominal pain.  Psychiatric/Behavioral: Negative for sleep disturbance.      Objective:   Physical Exam  Constitutional: She is oriented to person, place, and time. She appears well-developed and well-nourished. No distress.  HENT:  Head: Normocephalic and atraumatic.  Eyes: Conjunctivae and EOM are normal.  Neck: Neck supple. No tracheal deviation present.  Cardiovascular: Normal rate, regular rhythm and normal heart sounds.  Pulmonary/Chest: Effort normal and breath sounds normal. No respiratory distress.  Musculoskeletal: Normal range of motion.  Neurological: She is alert and oriented to person, place, and time.  Skin: Skin is warm and dry.  Psychiatric: She has a normal mood and affect. Her behavior is normal.  Nursing note and vitals reviewed.  BP 128/86 (BP Location: Left Arm, Patient Position: Sitting, Cuff Size: Normal)   Pulse 95   Temp 98 F (36.7 C) (Oral)   Resp 16   Ht 5' 6.25" (1.683 m)   Wt 194 lb 3.2 oz (88.1 kg)   LMP 11/03/2016 (Exact Date)   SpO2 98%   BMI 31.11 kg/m    Results for orders placed or performed in visit on 12/05/17  POCT glycosylated hemoglobin (Hb A1C)  Result Value Ref Range   Hemoglobin A1C 6.6       Assessment & Plan:   1. Elevated hemoglobin A1c - has made excellent efforts at lifestyle changes - cont with diet and exercise. Has only been 6 weeks of effort so hopeuflly true  a1c is currently more ~6.4.  Recheck in 4 mos. Lab Results  Component Value Date   HGBA1C 6.6 12/05/2017   HGBA1C 6.8 (H) 10/23/2017   HGBA1C 6.6 (H) 07/20/2017   HGBA1C 5.6 07/21/2013    2. Screening examination for infectious disease   3. Vitamin D deficiency - finish high dose course, than change to 2000u qd.    Orders Placed This Encounter  Procedures  . Fructosamine  . HIV antibody  . VITAMIN D 25 Hydroxy (Vit-D Deficiency, Fractures)  . Microalbumin/Creatinine Ratio, Urine    Standing Status:   Future    Standing Expiration Date:   12/05/2018  . POCT glycosylated hemoglobin (Hb A1C)    No orders of the defined types were placed in this encounter.   I personally performed the services described in this documentation, which was scribed in my presence. The recorded information has been reviewed and considered, and addended by me as needed.   [name], M.D.  Primary Care at [facility] [address] [place], [place] [address] [phone] phone [phone] fax  12/07/17 6:32 PM

## 2017-12-05 NOTE — Patient Instructions (Signed)
     IF you received an x-ray today, you will receive an invoice from [facility]. Please contact [facility] at [phone] with questions or concerns regarding your invoice.   IF you received labwork today, you will receive an invoice from [facility]. Please contact [facility] at [phone] with questions or concerns regarding your invoice.   Our billing staff will not be able to assist you with questions regarding bills from these companies.  You will be contacted with the lab results as soon as they are available. The fastest way to get your results is to activate your My Chart account. Instructions are located on the last page of this paperwork. If you have not heard from us regarding the results in 2 weeks, please contact this office.     

## 2017-12-07 LAB — FRUCTOSAMINE: Fructosamine: 279 umol/L (ref 0–285)

## 2017-12-07 LAB — VITAMIN D 25 HYDROXY (VIT D DEFICIENCY, FRACTURES): VIT D 25 HYDROXY: 37 ng/mL (ref 30.0–100.0)

## 2017-12-07 LAB — HIV ANTIBODY (ROUTINE TESTING W REFLEX): HIV SCREEN 4TH GENERATION: NONREACTIVE

## 2017-12-07 NOTE — Telephone Encounter (Signed)
Left detailed message for lab visit only.

## 2017-12-25 ENCOUNTER — Ambulatory Visit (INDEPENDENT_AMBULATORY_CARE_PROVIDER_SITE_OTHER): Payer: BC Managed Care – PPO | Admitting: Physician Assistant

## 2017-12-25 ENCOUNTER — Telehealth: Payer: Self-pay | Admitting: Family Medicine

## 2017-12-25 ENCOUNTER — Telehealth: Payer: Self-pay

## 2017-12-25 ENCOUNTER — Encounter: Payer: Self-pay | Admitting: Physician Assistant

## 2017-12-25 VITALS — BP 130/80 | HR 101 | Temp 98.6°F | Resp 18 | Ht 67.5 in | Wt 192.0 lb

## 2017-12-25 DIAGNOSIS — J111 Influenza due to unidentified influenza virus with other respiratory manifestations: Secondary | ICD-10-CM

## 2017-12-25 DIAGNOSIS — R69 Illness, unspecified: Secondary | ICD-10-CM

## 2017-12-25 MED ORDER — HYDROCODONE-HOMATROPINE 5-1.5 MG/5ML PO SYRP: 2.5000 mL | ORAL_SOLUTION | Freq: Every day | ORAL | 0 refills | Status: AC

## 2017-12-25 MED ORDER — OSELTAMIVIR PHOSPHATE 75 MG PO CAPS
75.0000 mg | ORAL_CAPSULE | Freq: Two times a day (BID) | ORAL | 0 refills | Status: DC
Start: 2017-12-25 — End: 2018-04-19

## 2017-12-25 MED ORDER — HYDROCODONE-HOMATROPINE 5-1.5 MG/5ML PO SYRP: 2.5000 mL | ORAL_SOLUTION | Freq: Every day | ORAL | 0 refills | Status: DC

## 2017-12-25 NOTE — Progress Notes (Signed)
12/25/2017 8:30 AM   DOB: Apr 19, 1971 / MRN: [id number]  SUBJECTIVE:  Stacy Hull is a 47 y.o. female presenting for sudden onset cough, fever, myalgia, overwhelming fatigue starting yesterday.  Her son was diagnosed with the flu earlier in thpediatrician. She has had a flu shot.  Denies shortness of breath, chest pain, bloody sputum.  She does have a history of asthma.  She has a history of diabetes and is trying to lose weight.  She is allergic to erythromycin.   She  has a past medical history of Allergic rhinitis, Anxiety, Asthma, Depression, GERD (gastroesophageal reflux disease), HTN (hypertension), Hyperlipidemia, Hyperparathyroidism ([place]), and Pneumonia.    She  reports that  has never smoked. she has never used smokeless tobacco. She reports that she drinks about 2.0 oz of alcohol per week. She reports that she does not use drugs. She  reports that she currently engages in sexual activity and has had partners who are Female. She reports using the following method of birth control/protection: Surgical. The patient  has a past surgical history that includes Cesarean section (2005); Parathyroidectomy (1999); Cervical cone biopsy; Tubal ligation; Hysteroscopy with novasure (11/26/2012); Umbilical hernia repair; Wisdom tooth extraction; Robotic assisted laparoscopic ovarian cystectomy (Right, 10/01/2015); Robotic assisted total hysterectomy with salpingectomy (N/A, 11/21/2016); Laparoscopic lysis of adhesions (N/A, 11/21/2016); laparoscopy (N/A, 11/21/2016); and Repair vaginal cuff (N/A, 12/14/2016).  Her family history includes Aneurysm (age of onset: 85) in her father; Depression in her mother; Diabetes in her maternal grandfather and maternal grandmother; Hypertension in her father; Stroke (age of onset: 40) in her paternal grandfather.  ROS  Per HPI  The problem list and medications were reviewed and updated by myself where necessary and exist elsewhere in the encounter.    OBJECTIVE:  BP 130/80   Pulse (!) 101   Temp 98.6 F (37 C) (Oral)   Resp 18   Ht 5' 7.5" (1.715 m)   Wt 192 lb (87.1 kg)   LMP 11/03/2016 (Exact Date)   SpO2 98%   BMI 29.63 kg/m   Physical Exam  Constitutional: She is active.  Non-toxic appearance.  HENT:  Right Ear: Hearing, tympanic membrane, external ear and ear canal normal.  Left Ear: Hearing, tympanic membrane, external ear and ear canal normal.  Nose: Nose normal. Right sinus exhibits no maxillary sinus tenderness and no frontal sinus tenderness. Left sinus exhibits no maxillary sinus tenderness and no frontal sinus tenderness.  Mouth/Throat: Uvula is midline, oropharynx is clear and moist and mucous membranes are normal. Mucous membranes are not dry. No oropharyngeal exudate, posterior oropharyngeal edema or tonsillar abscesses.  Cardiovascular: Normal rate, regular rhythm, S1 normal, S2 normal, normal heart sounds and intact distal pulses. Exam reveals no gallop, no friction rub and no decreased pulses.  No murmur heard. Pulmonary/Chest: Effort normal. No stridor. No tachypnea. No respiratory distress. She has no wheezes. She has no rales.  Abdominal: She exhibits no distension.  Musculoskeletal: She exhibits no edema.  Lymphadenopathy:       Head (right side): No submandibular and no tonsillar adenopathy present.       Head (left side): No submandibular and no tonsillar adenopathy present.    She has no cervical adenopathy.  Neurological: She is alert.  Skin: Skin is warm and dry. She is not diaphoretic. No pallor.    No results found for this or any previous visit (from the past 72 hour(s)).  No results found.  ASSESSMENT AND PLAN:  Bronnie was seen today  for cough and generalized body aches.  Diagnoses and all orders for this visit:  Influenza-like illness: See HPI.  She has a history of asthma and diabetes.  Starting Tamiflu.  Lung exam is normal today.  RTC and ED precautions discussed with patient per  ABS. -     oseltamivir (TAMIFLU) 75 MG capsule; Take 1 capsule (75 mg total) by mouth 2 (two) times daily.    The patient is advised to call or return to clinic if she does not see an improvement in symptoms, or to seek the care of the closest emergency department if she worsens with the above plan.   Philis Fendt, MHS, PA-C [facility] 12/25/2017 8:30 AM

## 2017-12-25 NOTE — Telephone Encounter (Signed)
Spoke with pharmacy and they informed me that her cough medication was received. Called pt and informed her she could go pick up cough medication.

## 2017-12-25 NOTE — Patient Instructions (Addendum)
Take 1000 mg of Tylenol every 8 hours for fever.  Okay to add in 400-600 mg of ibuprofen every 6-8 hours as needed.  If you develop any shortness of breath, chest pain, leg swelling and please come back to the clinic or go to the emergency department.    IF you received an x-ray today, you will receive an invoice from [facility]. Please contact [facility] at [phone] with questions or concerns regarding your invoice.   IF you received labwork today, you will receive an invoice from [facility]. Please contact [facility] at [phone] with questions or concerns regarding your invoice.   Our billing staff will not be able to assist you with questions regarding bills from these companies.  You will be contacted with the lab results as soon as they are available. The fastest way to get your results is to activate your My Chart account. Instructions are located on the last page of this paperwork. If you have not heard from [place] regarding the results in 2 weeks, please contact this office.

## 2017-12-25 NOTE — Telephone Encounter (Signed)
Spoke with Legrand Como, he will be sending something in.   Copied from [place] [id number]. Topic: General - Other >> Dec 25, 2017  9:09 AM Lolita Rieger, RMA wrote: Reason for CRM: pt called and stated that something for a cough was discussed during visit but was not sent to pharmacy  Pt would like a call back@ [phone] or something sent to her pharmacy [facility] [address]

## 2017-12-25 NOTE — Addendum Note (Signed)
Addended by: Tereasa Coop on: 12/25/2017 05:00 PM   Modules accepted: Orders

## 2017-12-25 NOTE — Telephone Encounter (Signed)
Copied from [place] [id number]. Topic: General - Other >> Dec 25, 2017  9:09 AM Lolita Rieger, RMA wrote: Reason for CRM: pt called and stated that something for a cough was discussed during visit but was not sent to pharmacy  Pt would like a call back@ [phone] or something sent to her pharmacy [facility] on [address]

## 2018-01-16 ENCOUNTER — Ambulatory Visit: Payer: BC Managed Care – PPO | Admitting: Family Medicine

## 2018-01-16 ENCOUNTER — Encounter: Payer: Self-pay | Admitting: Family Medicine

## 2018-01-16 VITALS — BP 129/82 | HR 105 | Temp 98.5°F | Resp 16 | Ht 66.0 in | Wt 192.4 lb

## 2018-01-16 DIAGNOSIS — F4322 Adjustment disorder with anxiety: Secondary | ICD-10-CM

## 2018-01-16 MED ORDER — ALPRAZOLAM 0.5 MG PO TABS: 0.2500 mg | ORAL_TABLET | Freq: Three times a day (TID) | ORAL | 2 refills | Status: DC | PRN

## 2018-01-16 MED ORDER — TRAZODONE HCL 50 MG PO TABS: 50.0000 mg | ORAL_TABLET | Freq: Every day | ORAL | 4 refills | Status: DC

## 2018-01-16 NOTE — Patient Instructions (Addendum)
Take the alprazolam 1/2 tab 3 times a day on weekends - breakfast, lunch, dinner During the week, take alprazolam 1/2 tab mid afternoon and again at dinner.  Every night, take 1-2 tabs of the trazodone about an hour before you want to go to bed - so around 9?  Schedule your gym time after work on the weekdays 3 days a week minimum or 2 days a week gym and 2 days boweling.  Give [place] a call on Monday to schedule an appointment with Durward Mallard - really good with parenting issues and family dynamics.  If that is not a good fit, give me a call - I have lots of other recs.   IF you received an x-ray today, you will receive an invoice from [facility]. Please contact [facility] at [phone] with questions or concerns regarding your invoice.   IF you received labwork today, you will receive an invoice from [place]. Please contact [facility] at [phone] with questions or concerns regarding your invoice.   Our billing staff will not be able to assist you with questions regarding bills from these companies.  You will be contacted with the lab results as soon as they are available. The fastest way to get your results is to activate your My Chart account. Instructions are located on the last page of this paperwork. If you have not heard from [place] regarding the results in 2 weeks, please contact this office.     Adjustment Disorder, Adult Adjustment disorder is a group of symptoms that can develop after a stressful life event, such as the loss of a job or serious physical illness. The symptoms can affect how you feel, think, and act. They may interfere with your relationships. Adjustment disorder increases your risk of suicide and substance abuse. If this disorder is not managed early, it can develop into a more serious condition, such as major depressive disorder or post-traumatic stress disorder. What are the causes? This condition happens when you  have trouble recovering from or coping with a stressful life event. What increases the risk? You are more likely to develop this condition if:  You have had depression or anxiety.  You are being treated for a long-term (chronic) illness.  You are being treated for an illness that cannot be cured (terminal illness).  You have a family history of mental illness.  What are the signs or symptoms? Symptoms of this condition include:  Extreme trouble doing daily tasks, such as going to work.  Sadness, depression, or crying spells.  Worrying a lot.  Loss of enjoyment.  Change in appetite or weight.  Feelings of loss or hopelessness.  Thoughts of suicide.  Anxiety, worry, or nervousness.  Trouble sleeping.  Avoiding family and friends.  Fighting or vandalism.  Complaining of feeling sick without being ill.  Feeling dazed or disconnected.  Nightmares.  Trouble sleeping.  Irritability.  Reckless driving.  Poor work Systems analyst.  Ignoring bills.  Symptoms of this condition start within three months of the stressful event. They do not last more than six months, unless the stressful circumstances last longer. Normal grieving after the death of a loved one is not a symptom of this condition. How is this diagnosed? To diagnose this condition, your health care provider will ask about what has happened in your life and how it has affected you. He or she may also ask about your medical history and your use of medicines, alcohol, and other substances. Your health care provider  may do a physical exam and order lab tests or other studies. You may be referred to a mental health specialist. How is this treated? Treatment options for this condition include:  Counseling or talk therapy. Talk therapy is usually provided by mental health specialists.  Medicines. Certain medicines may help with depression, anxiety, and sleep.  Support groups. These offer emotional support, advice,  and guidance. They are made up of people who have had similar experiences.  Observation and time. This is sometimes called "watchful waiting." In this treatment, health care providers monitor your health and behavior without other treatment. Adjustment disorder sometimes gets better on its own with time.  Follow these instructions at home:  Take over-the-counter and prescription medicines only as told by your health care provider.  Keep all follow-up visits as told by your health care provider. This is important. Contact a health care provider if:  Your symptoms do not improve in six months.  Your symptoms get worse. Get help right away if:  You have serious thoughts about hurting yourself or someone else. If you ever feel like you may hurt yourself or others, or have thoughts about taking your own life, get help right away. You can go to your nearest emergency department or call:  Your local emergency services (911 in the [place]  A suicide crisis helpline, such as the [facility] at 617 536 0495. This is open 24 hours a day.  Summary  Adjustment disorder is a group of symptoms that can develop after a stressful life event, such as the loss of a job or serious physical illness. The symptoms can affect how you feel, think, and act. They may interfere with your relationships.  Symptoms of this condition start within three months of the stressful event. They do not last more than six months, unless the stressful circumstances last longer.  Treatment may include talk therapy, medicines, participation in a support group, or observation to see if symptoms improve.  Contact your health care provider if your symptoms get worse or do not improve in six months.  If you ever feel like you may hurt yourself or others, or have thoughts about taking your own life, get help right away. This information is not intended to replace advice given to you by your health care  provider. Make sure you discuss any questions you have with your health care provider. Document Released: 07/22/2006 Document Revised: 01/16/2017 Document Reviewed: 01/16/2017 Elsevier Interactive Patient Education  [employer].

## 2018-01-16 NOTE — Progress Notes (Addendum)
01/16/2018 at 8:57 AM. Subjective:    Patient ID: Stacy Hull, female    DOB: November 20, 1971, 47 y.o.   MRN: [id number]  HPI Chief Complaint  Patient presents with  . Anxiety    patient would like to discuss increased anxiety and possible therapy referral  . Medication Refill    refills for Trazodone and Xanax   Stacy Hull is a 47 y.o. female who presents to Primary Care at [facility] complaining of anxiety.  Daughter has been having anxiety - saw Dr. Quentin Cornwall who went through initial battery of tests who is going to be falling on the spectrum - very high on all of the scared level.  Starting to notice that husband is on the spectrum as well. But has been a lot ot digest.  Can take the trazodone when she wakes up at 1 or 2 without feeling off in the mornings. but then feels like xanax makes her feel bad in the morning - more anxious when she wakes up in the morning.  Can take the xanax during the day and does feel a slightly be to relaxed at work but can do her job.  Many times at night she will wake up with thoughts reeling.  Her daughter Stacy Hull is having some insomnia issues too.   She feels like she doesn't get a break or any alone time.  There is never a time when she is not required to do something  Husband works 12 hrs on the weekend.  So she feels like she is rock of the family.  Spend all yesterday crying so she went swimming for a walk. TOok herson Stacy Hull boweling.   Just feels like os much is on her.  Jst wants to get in bed lk    Past Medical History:  Diagnosis Date  . Allergic rhinitis   . Anxiety   . Asthma    after URI  . Depression   . GERD (gastroesophageal reflux disease)    takes TUMS  . HTN (hypertension)   . Hyperlipidemia   . Hyperparathyroidism ([place])   . Pneumonia    Past Surgical History:  Procedure Laterality Date  . CERVICAL CONE BIOPSY    . CESAREAN SECTION  2005  . HYSTEROSCOPY WITH NOVASURE  11/26/2012   Procedure: HYSTEROSCOPY WITH  NOVASURE;  Surgeon: Marvene Staff, MD;  Location: [facility];  Service: Gynecology;  Laterality: N/A;  . LAPAROSCOPIC LYSIS OF ADHESIONS N/A 11/21/2016   Procedure: EXTENSIVE LAPAROSCOPIC LYSIS OF ADHESIONS;  Surgeon: Servando Salina, MD;  Location: [facility];  Service: Gynecology;  Laterality: N/A;  . LAPAROSCOPY N/A 11/21/2016   Procedure: LAPAROSCOPY DIAGNOSTIC, LAPAROSCOPIC REMOVAL OF FOREIGN BODY;  Surgeon: Ralene Ok, MD;  Location: [facility];  Service: General;  Laterality: N/A;  . PARATHYROIDECTOMY  1999   benign  . REPAIR VAGINAL CUFF N/A 12/14/2016   Procedure: REPAIR VAGINAL CUFF;  Surgeon: Brien Few, MD;  Location: [facility];  Service: Gynecology;  Laterality: N/A;  . ROBOTIC ASSISTED LAPAROSCOPIC OVARIAN CYSTECTOMY Right 10/01/2015   Procedure: ROBOTIC ASSISTED LAPAROSCOPIC BILATERAL SALPINGECTOMY WITH LYSIS OF ADHESIONS AND RIGHT ADNEXAL CYST;  Surgeon: Princess Bruins, MD;  Location: [facility];  Service: Gynecology;  Laterality: Right;  POSSIBLE robot .  DO NOT DRAPE THE ROBOT.....she will use the laparoscopic camera to start.    . ROBOTIC ASSISTED TOTAL HYSTERECTOMY WITH SALPINGECTOMY N/A 11/21/2016   Procedure: ROBOTIC ASSISTED TOTAL HYSTERECTOMY WITH  EXTENSIVE LYSIS OF ADHESIONS;  Surgeon: Servando Salina, MD;  Location: [facility];  Service: Gynecology;  Laterality: N/A;  . TUBAL LIGATION    . UMBILICAL HERNIA REPAIR    . WISDOM TOOTH EXTRACTION     Current Outpatient Medications on File Prior to Visit  Medication Sig Dispense Refill  . ALPRAZolam (XANAX) 0.5 MG tablet Take 1 tablet (0.5 mg total) by mouth 3 (three) times daily as needed. for anxiety 90 tablet 1  . citalopram (CELEXA) 40 MG tablet Take 1 tablet (40 mg total) by mouth daily. 90 tablet 3  . cyclobenzaprine (FLEXERIL) 10 MG tablet Take 1 tablet (10 mg total) by mouth 3 (three) times daily as needed for muscle spasms. 30 tablet 2  . ibuprofen (ADVIL,MOTRIN) 800 MG tablet Take 1 tablet (800 mg total) by mouth  every 8 (eight) hours as needed for mild pain. 60 tablet 2  . lisinopril-hydrochlorothiazide (PRINZIDE,ZESTORETIC) 20-25 MG tablet Take 1 tablet by mouth daily. 90 tablet 3  . Multiple Vitamin (MULTIVITAMIN WITH MINERALS) TABS tablet Take 1 tablet by mouth daily.    Marland Kitchen oseltamivir (TAMIFLU) 75 MG capsule Take 1 capsule (75 mg total) by mouth 2 (two) times daily. 10 capsule 0  . ranitidine (ZANTAC) 150 MG tablet Take 1 tablet (150 mg total) by mouth 2 (two) times daily. 180 tablet 0  . traZODone (DESYREL) 50 MG tablet Take 0.5-1 tablets (25-50 mg total) by mouth at bedtime as needed for sleep. 30 tablet 3  . triamcinolone (NASACORT) 55 MCG/ACT AERO nasal inhaler Place 2 sprays into the nose daily. 1 Inhaler 12  . Vitamin D, Ergocalciferol, (DRISDOL) 50000 units CAPS capsule TAKE ONE CAPSULE BY MOUTH EVERY 7 DAYS 24 capsule 0   No current facility-administered medications on file prior to visit.    Allergies  Allergen Reactions  . Erythromycin Other (See Comments)    Reaction:  GI upset    Family History  Problem Relation Age of Onset  . Depression Mother   . Hypertension Father   . Aneurysm Father 68       cerebral  . Diabetes Maternal Grandmother   . Diabetes Maternal Grandfather   . Stroke Paternal Grandfather 52   Social History   Socioeconomic History  . Marital status: Married    Spouse name: Stacy Hull  . Number of children: 2  . Years of education: Master's +  . Highest education level: Not on file  Social Needs  . Financial resource strain: Not on file  . Food insecurity - worry: Not on file  . Food insecurity - inability: Not on file  . Transportation needs - medical: Not on file  . Transportation needs - non-medical: Not on file  Occupational History  . Occupation: HIGH SCHOOL ENGLISH     Employer: [employer]    Comment: [employer]  Tobacco Use  . Smoking status: Never Smoker  . Smokeless tobacco: Never Used  Substance and  Sexual Activity  . Alcohol use: Yes    Alcohol/week: 2.0 oz    Types: 4 Standard drinks or equivalent per week    Comment: occasionally  . Drug use: No  . Sexual activity: Yes    Partners: Male    Birth control/protection: Surgical  Other Topics Concern  . Not on file  Social History Narrative   Lives with her husband and their 2 children. Walks 3x's weekly for exercise.   Depression screen [facility] 2/9 01/16/2018 12/25/2017 12/05/2017 11/18/2017 10/23/2017  Decreased Interest 0 0 0 0 0  Down, Depressed, Hopeless  0 0 0 0 0  PHQ - 2 Score 0 0 0 0 0  Altered sleeping - - - - -  Tired, decreased energy - - - - -  Change in appetite - - - - -  Feeling bad or failure about yourself  - - - - -  Trouble concentrating - - - - -  Moving slowly or fidgety/restless - - - - -  Suicidal thoughts - - - - -  PHQ-9 Score - - - - -  Difficult doing work/chores - - - - -    Review of Systems  Constitutional: Positive for activity change (increased) and fatigue. Negative for appetite change, chills, fever and unexpected weight change.  Respiratory: Negative for shortness of breath.   Cardiovascular: Negative for chest pain, palpitations and leg swelling.  Gastrointestinal: Negative for abdominal distention, abdominal pain and vomiting.  Neurological: Positive for headaches.  Psychiatric/Behavioral: Positive for agitation, behavioral problems and dysphoric mood. Negative for confusion, decreased concentration, hallucinations, self-injury, sleep disturbance and suicidal ideas. The patient is nervous/anxious. The patient is not hyperactive.        Objective:   Physical Exam  Constitutional: She is oriented to person, place, and time. She appears well-developed and well-nourished. No distress.  HENT:  Head: Normocephalic and atraumatic.  Right Ear: External ear normal.  Left Ear: External ear normal.  Eyes: Conjunctivae are normal. No scleral icterus.  Neck: Normal range of motion. Neck supple. No  thyromegaly present.  Cardiovascular: Normal rate, regular rhythm, normal heart sounds and intact distal pulses.  Pulmonary/Chest: Effort normal and breath sounds normal. No respiratory distress.  Musculoskeletal: She exhibits no edema.  Lymphadenopathy:    She has no cervical adenopathy.  Neurological: She is alert and oriented to person, place, and time.  Skin: Skin is warm and dry. She is not diaphoretic. No erythema.  Psychiatric: Her speech is normal. Judgment and thought content normal. Her mood appears anxious. She is agitated. Cognition and memory are normal. She exhibits a depressed mood (frazzled, stressed appearing).    Vitals:   01/16/18 0956  BP: 129/82  Pulse: (!) 105  Resp: 16  Temp: 98.5 F (36.9 C)  TempSrc: Oral  SpO2: 96%  Weight: 192 lb 6.4 oz (87.3 kg)  Height: 5' 6"  (1.676 m)       Assessment & Plan:   1. Adjustment disorder with anxious mood   Cont celexa 40. Rec regulating meds and schedule rather than just prn use where she is already to fed up, she is just trying to shut her mind off.  Rec taking 1/2 tab xanax 1-2 hrs before before going home (after school is over) and again 1-2 hrs before bed. Take tid on weekends. Take 1-2 trazodone an hour or two AFTER kids are in bed so is going to sleep everynight after she has a little time for herself. Schedule "me" time that is just as important as a scheduled work meeting - [place], gym, just going to a coffee shop to read.  On Monday, make appt w/ therapist -prefers female - try Durward Mallard at [facility] as he has a lot of experience with parenting advise which she would find helpful.   Meds ordered this encounter  Medications  . ALPRAZolam (XANAX) 0.5 MG tablet    Sig: Take 0.5-1 tablets (0.25-0.5 mg total) by mouth 3 (three) times daily as needed. for anxiety    Dispense:  90 tablet    Refill:  2  . traZODone (  DESYREL) 50 MG tablet    Sig: Take 1-2 tablets (50-100 mg total) by mouth at  bedtime.    Dispense:  60 tablet    Refill:  4   Over 25 min spent in face-to-face evaluation of and consultation with patient and coordination of care.  Over 50% of this time was spent counseling this patient regarding mood sxs and planning interventions.   [name], M.D.  Primary Care at [facility] [address] [place], [place] [address] [phone] phone [phone] fax  01/16/18 9:52 AM

## 2018-01-23 ENCOUNTER — Other Ambulatory Visit: Payer: Self-pay | Admitting: Family Medicine

## 2018-01-23 ENCOUNTER — Ambulatory Visit: Payer: BC Managed Care – PPO | Admitting: Family Medicine

## 2018-04-19 ENCOUNTER — Ambulatory Visit: Payer: BC Managed Care – PPO | Admitting: Physician Assistant

## 2018-04-19 ENCOUNTER — Other Ambulatory Visit: Payer: Self-pay

## 2018-04-19 ENCOUNTER — Encounter: Payer: Self-pay | Admitting: Physician Assistant

## 2018-04-19 VITALS — BP 130/82 | HR 88 | Temp 99.1°F | Resp 18 | Ht 66.0 in | Wt 195.0 lb

## 2018-04-19 DIAGNOSIS — M545 Low back pain, unspecified: Secondary | ICD-10-CM

## 2018-04-19 MED ORDER — CYCLOBENZAPRINE HCL 10 MG PO TABS: 5.0000 mg | ORAL_TABLET | Freq: Three times a day (TID) | ORAL | 0 refills | Status: DC | PRN

## 2018-04-19 MED ORDER — PREDNISONE 20 MG PO TABS: 40.0000 mg | ORAL_TABLET | Freq: Every day | ORAL | Status: DC

## 2018-04-19 MED ORDER — MELOXICAM 15 MG PO TABS: 7.5000 mg | ORAL_TABLET | Freq: Every day | ORAL | 0 refills | Status: AC

## 2018-04-19 NOTE — Progress Notes (Signed)
04/19/2018 1:20 PM   DOB: 31-Oct-1971 / MRN: [id number]  SUBJECTIVE:  Stacy Hull is a 47 y.o. female presenting for acute on chronic back pain.  Current episode began to exacerbate about 4 days ago.  She is tried ibuprofen and has been on Flexeril left over all of which seem to help some.  Tells me she has pain around the buttock as she points to the right lumbar paraspinal and piriformis area.  Standing up and position changes make the pain worse.  She is required prednisone in the past for relief of symptoms.  Denies any bowel or bladder incontinence, weakness in the legs, paresthesia in the feet.  She is allergic to erythromycin.   She  has a past medical history of Allergic rhinitis, Anxiety, Asthma, Depression, GERD (gastroesophageal reflux disease), HTN (hypertension), Hyperlipidemia, Hyperparathyroidism ([place]), and Pneumonia.    She  reports that she has never smoked. She has never used smokeless tobacco. She reports that she drinks about 2.0 oz of alcohol per week. She reports that she does not use drugs. She  reports that she currently engages in sexual activity and has had partners who are Female. She reports using the following method of birth control/protection: Surgical. The patient  has a past surgical history that includes Cesarean section (2005); Parathyroidectomy (1999); Cervical cone biopsy; Tubal ligation; Hysteroscopy with novasure (11/26/2012); Umbilical hernia repair; Wisdom tooth extraction; Robotic assisted laparoscopic ovarian cystectomy (Right, 10/01/2015); Robotic assisted total hysterectomy with salpingectomy (N/A, 11/21/2016); Laparoscopic lysis of adhesions (N/A, 11/21/2016); laparoscopy (N/A, 11/21/2016); and Repair vaginal cuff (N/A, 12/14/2016).  Her family history includes Aneurysm (age of onset: 25) in her father; Depression in her mother; Diabetes in her maternal grandfather and maternal grandmother; Hypertension in her father; Stroke (age of onset: 20) in her  paternal grandfather.    The problem list and medications were reviewed and updated by myself where necessary and exist elsewhere in the encounter.   OBJECTIVE:  BP 130/82   Pulse 88   Temp 99.1 F (37.3 C) (Oral)   Resp 18   Ht 5\' 6"  (1.676 m)   Wt 195 lb (88.5 kg)   LMP 11/03/2016 (Exact Date)   SpO2 99%   BMI 31.47 kg/m   Physical Exam  Constitutional: She is oriented to person, place, and time. She appears well-nourished. No distress.  Eyes: Pupils are equal, round, and reactive to light. EOM are normal.  Cardiovascular: Normal rate.  Pulmonary/Chest: Effort normal.  Abdominal: She exhibits no distension.  Musculoskeletal: She exhibits tenderness. She exhibits no edema or deformity.  TTP about the right lumbar paraspinal musculature into the sciatic notch.  Reflexes 2+ at the patellar and Achilles tendons.  Gait normal.  Negative straight leg raise test bilaterally.  Neurological: She is alert and oriented to person, place, and time. No cranial nerve deficit. Gait normal.  Skin: Skin is dry. She is not diaphoretic.  Psychiatric: She has a normal mood and affect.  Vitals reviewed.   No results found for this or any previous visit (from the past 72 hour(s)).  No results found.  ASSESSMENT AND PLAN:  Terris was seen today for back pain and medication refill.  Diagnoses and all orders for this visit:  Back pain, lumbosacral: Patient is in significant amount of pain today.  Talked about the risk and benefits of prednisone especially as it relates to her very early diabetes diagnosis.  She will be very conscious of her diet in the next few days and  will watch her calories and sugar intake closely.  Advised that she can start meloxicam after completing the prednisone course and can use Flexeril throughout this exacerbation.  With regard to her request for vitamin D refills Dr. Brigitte Pulse previously advised that she start 2000 mg daily and she tells me she is getting this in the  vitamin.  Other orders -     predniSONE (DELTASONE) 20 MG tablet; Take 2 tablets (40 mg total) by mouth daily with breakfast. No NSAIDS with this med. -     cyclobenzaprine (FLEXERIL) 10 MG tablet; Take 0.5-1 tablets (5-10 mg total) by mouth 3 (three) times daily as needed. -     meloxicam (MOBIC) 15 MG tablet; Take 0.5-1 tablets (7.5-15 mg total) by mouth daily. Take with food. Do not take Ibuprofen, Goody's, or Aleve while taking this medication.    The patient is advised to call or return to clinic if she does not see an improvement in symptoms, or to seek the care of the closest emergency department if she worsens with the above plan.   Philis Fendt, MHS, PA-C Primary Care at [facility] 04/19/2018 1:20 PM

## 2018-04-19 NOTE — Patient Instructions (Addendum)
  You can tylenol 1000 every eight hours  when taking pred.  Take flexeril 5-10 as needed. Use meloxicam after prednisone as needed. Come back if this all fails to work.   IF you received an x-ray today, you will receive an invoice from [facility]. Please contact [facility] at [phone] with questions or concerns regarding your invoice.   IF you received labwork today, you will receive an invoice from [place]. Please contact [facility] at [phone] with questions or concerns regarding your invoice.   Our billing staff will not be able to assist you with questions regarding bills from these companies.  You will be contacted with the lab results as soon as they are available. The fastest way to get your results is to activate your My Chart account. Instructions are located on the last page of this paperwork. If you have not heard from [place] regarding the results in 2 weeks, please contact this office.

## 2018-05-23 ENCOUNTER — Other Ambulatory Visit: Payer: Self-pay | Admitting: Family Medicine

## 2018-05-23 ENCOUNTER — Other Ambulatory Visit: Payer: Self-pay | Admitting: Physician Assistant

## 2018-05-24 NOTE — Telephone Encounter (Signed)
Patient is requesting a refill of the following medications: Requested Prescriptions   Pending Prescriptions Disp Refills  . cyclobenzaprine (FLEXERIL) 10 MG tablet [Pharmacy Med Name: CYCLOBENZAPRINE 10MG  TABLETS] 45 tablet 0    Sig: TAKE 1/2 TO 1 TABLET(5 TO 10 MG) BY MOUTH THREE TIMES DAILY AS NEEDED    Date of patient request: 05/23/18  Last office visit: 04/19/18  Date of last refill: 04/19/18 Last refill amount: #45 0RF  Follow up time period per chart: none scheduled at the moment

## 2018-07-08 ENCOUNTER — Ambulatory Visit: Payer: BC Managed Care – PPO | Admitting: Family Medicine

## 2018-07-08 ENCOUNTER — Encounter: Payer: Self-pay | Admitting: Family Medicine

## 2018-07-08 ENCOUNTER — Other Ambulatory Visit: Payer: Self-pay

## 2018-07-08 VITALS — BP 152/88 | HR 91 | Temp 98.0°F | Resp 16 | Ht 67.52 in | Wt 189.0 lb

## 2018-07-08 DIAGNOSIS — D2261 Melanocytic nevi of right upper limb, including shoulder: Secondary | ICD-10-CM

## 2018-07-08 DIAGNOSIS — L918 Other hypertrophic disorders of the skin: Secondary | ICD-10-CM | POA: Diagnosis not present

## 2018-07-08 NOTE — Progress Notes (Signed)
Subjective:    Patient ID: Stacy Hull, female    DOB: August 05, 1971, 47 y.o.   MRN: [id number] Chief Complaint  Patient presents with  . Nevus    pt states she has a mole that she would like to get removed on the right shoulder     HPI growing mole on right shoulder gets caught on bra strap and irritated, painful  Skin tags around neck get caught on jewelry and bleed.  Past Medical History:  Diagnosis Date  . Allergic rhinitis   . Anxiety   . Asthma    after URI  . Depression   . GERD (gastroesophageal reflux disease)    takes TUMS  . HTN (hypertension)   . Hyperlipidemia   . Hyperparathyroidism ([place])   . Pneumonia    Past Surgical History:  Procedure Laterality Date  . CERVICAL CONE BIOPSY    . CESAREAN SECTION  2005  . HYSTEROSCOPY WITH NOVASURE  11/26/2012   Procedure: HYSTEROSCOPY WITH NOVASURE;  Surgeon: Marvene Staff, MD;  Location: [place] [facility];  Service: Gynecology;  Laterality: N/A;  . LAPAROSCOPIC LYSIS OF ADHESIONS N/A 11/21/2016   Procedure: EXTENSIVE LAPAROSCOPIC LYSIS OF ADHESIONS;  Surgeon: Servando Salina, MD;  Location: [place] [facility];  Service: Gynecology;  Laterality: N/A;  . LAPAROSCOPY N/A 11/21/2016   Procedure: LAPAROSCOPY DIAGNOSTIC, LAPAROSCOPIC REMOVAL OF FOREIGN BODY;  Surgeon: Ralene Ok, MD;  Location: [place] [facility] [facility];  Service: General;  Laterality: N/A;  . PARATHYROIDECTOMY  1999   benign  . REPAIR VAGINAL CUFF N/A 12/14/2016   Procedure: REPAIR VAGINAL CUFF;  Surgeon: Brien Few, MD;  Location: [facility];  Service: Gynecology;  Laterality: N/A;  . ROBOTIC ASSISTED LAPAROSCOPIC OVARIAN CYSTECTOMY Right 10/01/2015   Procedure: ROBOTIC ASSISTED LAPAROSCOPIC BILATERAL SALPINGECTOMY WITH LYSIS OF ADHESIONS AND RIGHT ADNEXAL CYST;  Surgeon: Princess Bruins, MD;  Location: [place] [facility];  Service: Gynecology;  Laterality: Right;  POSSIBLE robot .  DO NOT DRAPE THE ROBOT.....she will use the laparoscopic camera to start.    . ROBOTIC ASSISTED TOTAL  HYSTERECTOMY WITH SALPINGECTOMY N/A 11/21/2016   Procedure: ROBOTIC ASSISTED TOTAL HYSTERECTOMY WITH  EXTENSIVE LYSIS OF ADHESIONS;  Surgeon: Servando Salina, MD;  Location: [place] [facility];  Service: Gynecology;  Laterality: N/A;  . TUBAL LIGATION    . UMBILICAL HERNIA REPAIR    . WISDOM TOOTH EXTRACTION     Current Outpatient Medications on File Prior to Visit  Medication Sig Dispense Refill  . citalopram (CELEXA) 40 MG tablet Take 1 tablet (40 mg total) by mouth daily. 90 tablet 3  . cyclobenzaprine (FLEXERIL) 10 MG tablet TAKE 1/2 TO 1 TABLET(5 TO 10 MG) BY MOUTH THREE TIMES DAILY AS NEEDED 45 tablet 0  . lisinopril-hydrochlorothiazide (PRINZIDE,ZESTORETIC) 20-25 MG tablet Take 1 tablet by mouth daily. 90 tablet 3  . Multiple Vitamin (MULTIVITAMIN WITH MINERALS) TABS tablet Take 1 tablet by mouth daily.    . ranitidine (ZANTAC) 150 MG tablet TAKE 1 TABLET(150 MG) BY MOUTH TWICE DAILY 180 tablet 0  . traZODone (DESYREL) 50 MG tablet Take 1-2 tablets (50-100 mg total) by mouth at bedtime. 60 tablet 4  . triamcinolone (NASACORT) 55 MCG/ACT AERO nasal inhaler Place 2 sprays into the nose daily. 1 Inhaler 12   No current facility-administered medications on file prior to visit.    Allergies  Allergen Reactions  . Erythromycin Other (See Comments)    Reaction:  GI upset    Family History  Problem Relation Age of Onset  . Depression Mother   .  Hypertension Father   . Aneurysm Father 1       cerebral  . Diabetes Maternal Grandmother   . Diabetes Maternal Grandfather   . Stroke Paternal Grandfather 63   Social History   Socioeconomic History  . Marital status: Married    Spouse name: Rena Hunke  . Number of children: 2  . Years of education: Master's +  . Highest education level: Not on file  Occupational History  . Occupation: HIGH SCHOOL ENGLISH     Employer: [employer]    Comment: [employer]  Social Needs  . Financial resource strain: Not on  file  . Food insecurity:    Worry: Not on file    Inability: Not on file  . Transportation needs:    Medical: Not on file    Non-medical: Not on file  Tobacco Use  . Smoking status: Never Smoker  . Smokeless tobacco: Never Used  Substance and Sexual Activity  . Alcohol use: Yes    Alcohol/week: 4.0 standard drinks    Types: 4 Standard drinks or equivalent per week    Comment: occasionally  . Drug use: No  . Sexual activity: Yes    Partners: Male    Birth control/protection: Surgical  Lifestyle  . Physical activity:    Days per week: Not on file    Minutes per session: Not on file  . Stress: Not on file  Relationships  . Social connections:    Talks on phone: Not on file    Gets together: Not on file    Attends religious service: Not on file    Active member of club or organization: Not on file    Attends meetings of clubs or organizations: Not on file    Relationship status: Not on file  Other Topics Concern  . Not on file  Social History Narrative   Lives with her husband and their 2 children. Walks 3x's weekly for exercise.   Depression screen [facility] 2/9 09/14/2018 09/07/2018 07/08/2018 04/19/2018 01/16/2018  Decreased Interest 2 2 0 0 0  Down, Depressed, Hopeless 2 1 0 1 0  PHQ - 2 Score 4 3 0 1 0  Altered sleeping 3 2 - - -  Tired, decreased energy 1 1 - - -  Change in appetite 0 0 - - -  Feeling bad or failure about yourself  2 1 - - -  Trouble concentrating 1 0 - - -  Moving slowly or fidgety/restless 1 (No Data) - - -  Suicidal thoughts 0 0 - - -  PHQ-9 Score 12 7 - - -  Difficult doing work/chores Very difficult Somewhat difficult - - -  Some recent data might be hidden   Review of Systems  Constitutional: Negative for chills, diaphoresis and fever.  Musculoskeletal: Negative for arthralgias and joint swelling.  Skin: Positive for color change. Negative for pallor, rash and wound.  Hematological: Negative for adenopathy. Does not bruise/bleed easily.    Psychiatric/Behavioral: Negative for sleep disturbance.   See  hpi    Objective:   Physical Exam  Constitutional: She is oriented to person, place, and time. She appears well-developed and well-nourished. No distress.  HENT:  Head: Normocephalic and atraumatic.  Right Ear: External ear normal.  Eyes: Conjunctivae are normal. No scleral icterus.  Pulmonary/Chest: Effort normal.  Neurological: She is alert and oriented to person, place, and time.  Skin: Skin is warm and dry. She is not diaphoretic. No erythema.  Psychiatric:  She has a normal mood and affect. Her behavior is normal.   Risks and benefits reviewed, and verbal informed consent obtained. Cleansed with alcohol 2 followed by Betadine 2. Anesthesia with subcutaneous administration of 1% lidocaine with epinephrine. Shave biopsy obtained using dermablade to site approximately 91m diameter. Hemostasis achieved with pressure and Drysol application. Biopsy site dressed with mupirocin in a pressure bandage. EBL less than 1 mL. No complications. Patient tolerated procedure well. Wound care reviewed in detail and all questions answered.  Skin tags around neck removed with scissors with ethy chloride spray for anesthesia    BP (!) 152/88   Pulse 91   Temp 98 F (36.7 C) (Oral)   Resp 16   Ht 5' 7.52" (1.715 m)   Wt 189 lb (85.7 kg)   LMP 11/03/2016 (Exact Date)   SpO2 96%   BMI 29.15 kg/m   Assessment & Plan:   1. Nevus of shoulder, right - shave bx today - suspect seborrheic keratosis but send for path to r/o squamous cell carcinoma  2. Skin tag, acquired       EDelman Cheadle MD, MPH [facility] [place]  [phone] phone  [phone] fax   09/19/18 3:43 AM

## 2018-07-08 NOTE — Patient Instructions (Addendum)
IF you received an x-ray today, you will receive an invoice from [facility]. Please contact [facility] at [phone] with questions or concerns regarding your invoice.   IF you received labwork today, you will receive an invoice from [place]. Please contact [facility] at [phone] with questions or concerns regarding your invoice.   Our billing staff will not be able to assist you with questions regarding bills from these companies.  You will be contacted with the lab results as soon as they are available. The fastest way to get your results is to activate your My Chart account. Instructions are located on the last page of this paperwork. If you have not heard from [place] regarding the results in 2 weeks, please contact this office.      Skin Biopsy, Care After Refer to this sheet in the next few weeks. These instructions provide you with information about caring for yourself after your procedure. Your health care provider may also give you more specific instructions. Your treatment has been planned according to current medical practices, but problems sometimes occur. Call your health care provider if you have any problems or questions after your procedure. What can I expect after the procedure? After the procedure, it is common to have:  Soreness.  Bruising.  Itching.  Follow these instructions at home:  Rest and then return to your normal activities as told by your health care provider.  Take over-the-counter and prescription medicines only as told by your health care provider.  Follow instructions from your health care provider about how to take care of your biopsy site.Make sure you: ? Wash your hands with soap and water before you change your bandage (dressing). If soap and water are not available, use hand sanitizer. ? Change your dressing as told by your health care provider. ? Leave stitches (sutures), skin glue, or adhesive strips in place. These  skin closures may need to stay in place for 2 weeks or longer. If adhesive strip edges start to loosen and curl up, you may trim the loose edges. Do not remove adhesive strips completely unless your health care provider tells you to do that. If the biopsy area bleeds, apply gentle pressure for 10 minutes.  Check your biopsy site every day for signs of infection. Check for: ? More redness, swelling, or pain. ? More fluid or blood. ? Warmth. ? Pus or a bad smell.  Keep all follow-up visits as told by your health care provider. This is important. Contact a health care provider if:  You have more redness, swelling, or pain around your biopsy site.  You have more fluid or blood coming from your biopsy site.  Your biopsy site feels warm to the touch.  You have pus or a bad smell coming from your biopsy site.  You have a fever. Get help right away if:  You have bleeding that does not stop with pressure or a dressing. This information is not intended to replace advice given to you by your health care provider. Make sure you discuss any questions you have with your health care provider. Document Released: 12/14/2015 Document Revised: 07/13/2016 Document Reviewed: 02/14/2015 Elsevier Interactive Patient Education  2018 [place]. Seborrheic Keratosis Seborrheic keratosis is a common, noncancerous (benign) skin growth. This condition causes waxy, rough, tan, brown, or black spots to appear on the skin. These skin growths can be flat or raised. What are the causes? The cause of this condition is not known. What increases the risk?  This condition is more likely to develop in:  People who have a family history of seborrheic keratosis.  People who are 86 or older.  People who are pregnant.  People who have had estrogen replacement therapy.  What are the signs or symptoms? This condition often occurs on the face, chest, shoulders, back, or other areas. These growths:  Are usually  painless, but may become irritated and itchy.  Can be yellow, brown, black, or other colors.  Are slightly raised or have a flat surface.  Are sometimes rough or wart-like in texture.  Are often waxy on the surface.  Are round or oval-shaped.  Sometimes look like they are "stuck on."  Often occur in groups, but may occur as a single growth.  How is this diagnosed? This condition is diagnosed with a medical history and physical exam. A sample of the growth may be tested (skin biopsy). You may need to see a skin specialist (dermatologist). How is this treated? Treatment is not usually needed for this condition, unless the growths are irritated or are often bleeding. You may also choose to have the growths removed if you do not like their appearance. Most commonly, these growths are treated with a procedure in which liquid nitrogen is applied to "freeze" off the growth (cryosurgery). They may also be burned off with electricity or cut off. Follow these instructions at home:  Watch your growth for any changes.  Keep all follow-up visits as told by your health care provider. This is important.  Do not scratch or pick at the growth or growths. This can cause them to become irritated or infected. Contact a health care provider if:  You suddenly have many new growths.  Your growth bleeds, itches, or hurts.  Your growth suddenly becomes larger or changes color. This information is not intended to replace advice given to you by your health care provider. Make sure you discuss any questions you have with your health care provider. Document Released: 12/20/2010 Document Revised: 04/24/2016 Document Reviewed: 04/04/2015 Elsevier Interactive Patient Education  2018 [place], Adult A skin tag (acrochordon) is a soft, extra growth of skin. Most skin tags are flesh-colored and rarely bigger than a pencil eraser. They commonly form near areas where there are folds in the skin,  such as the armpit or groin. Skin tags are not dangerous, and they do not spread from person to person (are not contagious). You may have one skin tag or several. Skin tags do not require treatment. However, your health care provider may recommend removal of a skin tag if it:  Gets irritated from clothing.  Bleeds.  Is visible and unsightly.  Your health care provider can remove skin tags with a simple surgical procedure or a procedure that involves freezing the skin tag. Follow these instructions at home:  Watch for any changes in your skin tag. A normal skin tag does not require any other special care at home.  Take over-the-counter and prescription medicines only as told by your health care provider.  Keep all follow-up visits as told by your health care provider. This is important. Contact a health care provider if:  You have a skin tag that: ? Becomes painful. ? Changes color. ? Bleeds. ? Swells.  You develop more skin tags. This information is not intended to replace advice given to you by your health care provider. Make sure you discuss any questions you have with your health care provider. Document Released: 12/02/2015 Document Revised:  07/13/2016 Document Reviewed: 12/02/2015 Elsevier Interactive Patient Education  [employer].

## 2018-07-15 ENCOUNTER — Ambulatory Visit: Payer: BC Managed Care – PPO | Admitting: Physician Assistant

## 2018-07-30 ENCOUNTER — Other Ambulatory Visit: Payer: Self-pay | Admitting: Family Medicine

## 2018-07-30 ENCOUNTER — Other Ambulatory Visit: Payer: Self-pay | Admitting: Physician Assistant

## 2018-07-30 NOTE — Telephone Encounter (Signed)
Xanax 0.5 mg refill Last Refill:01/16/18 # 90 with 2 refills,   prescription expired on 07/15/18 Last OV: 07/08/18 PCP: [name], MD Pharmacy:[facility] [phone]

## 2018-07-30 NOTE — Telephone Encounter (Signed)
Cyclobenzaprine 10 mg tab refill Last Refill:05/24/18 # 45 tabs Last OV: 07/08/18 PCP: Stacy Cheadle, MD [facility]acy: [facility] [phone]

## 2018-08-05 NOTE — Telephone Encounter (Signed)
Patient is requesting a refill of the following medications: Requested Prescriptions   Pending Prescriptions Disp Refills  . cyclobenzaprine (FLEXERIL) 10 MG tablet [Pharmacy Med Name: CYCLOBENZAPRINE 10MG  TABLETS] 45 tablet 0    Sig: TAKE 1/2 TO 1 TABLET(5 TO 10 MG) BY MOUTH THREE TIMES DAILY AS NEEDED    Date of patient request: 07/30/2018 Last office visit: 07/08/2018 Date of last refill: 05/24/2018 Last refill amount: 45 Follow up time period per chart:

## 2018-09-07 ENCOUNTER — Ambulatory Visit (INDEPENDENT_AMBULATORY_CARE_PROVIDER_SITE_OTHER): Payer: BC Managed Care – PPO

## 2018-09-07 ENCOUNTER — Ambulatory Visit: Payer: BC Managed Care – PPO | Admitting: Family Medicine

## 2018-09-07 ENCOUNTER — Other Ambulatory Visit: Payer: Self-pay

## 2018-09-07 ENCOUNTER — Encounter: Payer: Self-pay | Admitting: Family Medicine

## 2018-09-07 VITALS — BP 132/90 | HR 82 | Temp 98.8°F | Resp 17 | Ht 67.0 in | Wt 196.0 lb

## 2018-09-07 DIAGNOSIS — Z23 Encounter for immunization: Secondary | ICD-10-CM | POA: Diagnosis not present

## 2018-09-07 DIAGNOSIS — M25521 Pain in right elbow: Secondary | ICD-10-CM | POA: Diagnosis not present

## 2018-09-07 DIAGNOSIS — S59901A Unspecified injury of right elbow, initial encounter: Secondary | ICD-10-CM

## 2018-09-07 MED ORDER — IBUPROFEN 800 MG PO TABS
800.0000 mg | ORAL_TABLET | Freq: Three times a day (TID) | ORAL | 2 refills | Status: DC
Start: 2018-09-07 — End: 2019-10-13

## 2018-09-07 NOTE — Patient Instructions (Addendum)
  CLINICAL DATA:  Right elbow injury.  EXAM: RIGHT ELBOW - COMPLETE 3+ VIEW  COMPARISON:  None.  FINDINGS: There is no evidence of fracture, dislocation, or joint effusion. There is no evidence of arthropathy or other focal bone abnormality. Mild soft tissue swelling posteriorly.  IMPRESSION: No acute fracture or dislocation identified about the right elbow.   Electronically Signed   By: Fidela Salisbury M.D.   On: 09/07/2018 12:04    If you have lab work done today you will be contacted with your lab results within the next 2 weeks.  If you have not heard from [place] then please contact us. The fastest way to get your results is to register for My Chart.   IF you received an x-ray today, you will receive an invoice from [facility]. Please contact [facility] at [phone] with questions or concerns regarding your invoice.   IF you received labwork today, you will receive an invoice from [place]. Please contact [facility] at [phone] with questions or concerns regarding your invoice.   Our billing staff will not be able to assist you with questions regarding bills from these companies.  You will be contacted with the lab results as soon as they are available. The fastest way to get your results is to activate your My Chart account. Instructions are located on the last page of this paperwork. If you have not heard from [place] regarding the results in 2 weeks, please contact this office.

## 2018-09-07 NOTE — Progress Notes (Signed)
Chief Complaint  Patient presents with  . right elbow pain    since Friday evening, dryer took arm when she reached in and hit elbow/arm on drum of dryer.  Pain level 3/10, ibuprofen for pain.  Pt wants to make sure there is nothing broken or any muscle tears    HPI  She reports that she opened the dryer on Friday 09/03/2018 and reports that the drum of the barrel was still spinning and one of the paddles hit her elbow She states that the elbow is painful and swollen and is burning  She states that she cannot bend due to pain and stiffness She demonstrated what happened with the left side since the right side is immobilized in a sling  The arm was initially hyperextended by the sheet that was still spinning then the elbow was hit by the elbow She felt a pop and also had pain  She has been doing heating pads  She has some tingling in the fingers and some pulling sensation in the forearm The pain also radiates up the shoulder to the neck     Past Medical History:  Diagnosis Date  . Allergic rhinitis   . Anxiety   . Asthma    after URI  . Depression   . GERD (gastroesophageal reflux disease)    takes TUMS  . HTN (hypertension)   . Hyperlipidemia   . Hyperparathyroidism ([place])   . Pneumonia     Current Outpatient Medications  Medication Sig Dispense Refill  . ALPRAZolam (XANAX) 0.5 MG tablet TAKE ONE-HALF TO ONE TABLET BY MOUTH THREE TIMES DAILY AS NEEDED FOR ANXIETY 90 tablet 2  . citalopram (CELEXA) 40 MG tablet Take 1 tablet (40 mg total) by mouth daily. 90 tablet 3  . cyclobenzaprine (FLEXERIL) 10 MG tablet TAKE 1/2 TO 1 TABLET(5 TO 10 MG) BY MOUTH THREE TIMES DAILY AS NEEDED 45 tablet 0  . cyclobenzaprine (FLEXERIL) 10 MG tablet TAKE 1/2 TO 1 TABLET(5 TO 10 MG) BY MOUTH THREE TIMES DAILY AS NEEDED 45 tablet 2  . ibuprofen (ADVIL,MOTRIN) 800 MG tablet Take 1 tablet (800 mg total) by mouth every 8 (eight) hours. With food for 5 days and after than take every 8 hours as  needed 60 tablet 2  . lisinopril-hydrochlorothiazide (PRINZIDE,ZESTORETIC) 20-25 MG tablet Take 1 tablet by mouth daily. 90 tablet 3  . Multiple Vitamin (MULTIVITAMIN WITH MINERALS) TABS tablet Take 1 tablet by mouth daily.    . ranitidine (ZANTAC) 150 MG tablet TAKE 1 TABLET(150 MG) BY MOUTH TWICE DAILY 180 tablet 0  . traZODone (DESYREL) 50 MG tablet Take 1-2 tablets (50-100 mg total) by mouth at bedtime. 60 tablet 4  . triamcinolone (NASACORT) 55 MCG/ACT AERO nasal inhaler Place 2 sprays into the nose daily. 1 Inhaler 12   No current facility-administered medications for this visit.     Allergies:  Allergies  Allergen Reactions  . Erythromycin Other (See Comments)    Reaction:  GI upset     Past Surgical History:  Procedure Laterality Date  . CERVICAL CONE BIOPSY    . CESAREAN SECTION  2005  . HYSTEROSCOPY WITH NOVASURE  11/26/2012   Procedure: HYSTEROSCOPY WITH NOVASURE;  Surgeon: Marvene Staff, MD;  Location: [facility];  Service: Gynecology;  Laterality: N/A;  . LAPAROSCOPIC LYSIS OF ADHESIONS N/A 11/21/2016   Procedure: EXTENSIVE LAPAROSCOPIC LYSIS OF ADHESIONS;  Surgeon: Servando Salina, MD;  Location: [facility];  Service: Gynecology;  Laterality: N/A;  . LAPAROSCOPY N/A 11/21/2016  Procedure: LAPAROSCOPY DIAGNOSTIC, LAPAROSCOPIC REMOVAL OF FOREIGN BODY;  Surgeon: Ralene Ok, MD;  Location: [facility];  Service: General;  Laterality: N/A;  . PARATHYROIDECTOMY  1999   benign  . REPAIR VAGINAL CUFF N/A 12/14/2016   Procedure: REPAIR VAGINAL CUFF;  Surgeon: Brien Few, MD;  Location: [facility];  Service: Gynecology;  Laterality: N/A;  . ROBOTIC ASSISTED LAPAROSCOPIC OVARIAN CYSTECTOMY Right 10/01/2015   Procedure: ROBOTIC ASSISTED LAPAROSCOPIC BILATERAL SALPINGECTOMY WITH LYSIS OF ADHESIONS AND RIGHT ADNEXAL CYST;  Surgeon: Princess Bruins, MD;  Location: [facility];  Service: Gynecology;  Laterality: Right;  POSSIBLE robot .  DO NOT DRAPE THE ROBOT.....she will use the  laparoscopic camera to start.    . ROBOTIC ASSISTED TOTAL HYSTERECTOMY WITH SALPINGECTOMY N/A 11/21/2016   Procedure: ROBOTIC ASSISTED TOTAL HYSTERECTOMY WITH  EXTENSIVE LYSIS OF ADHESIONS;  Surgeon: Servando Salina, MD;  Location: [facility];  Service: Gynecology;  Laterality: N/A;  . TUBAL LIGATION    . UMBILICAL HERNIA REPAIR    . WISDOM TOOTH EXTRACTION      Social History   Socioeconomic History  . Marital status: Married    Spouse name: Ahtziri Jeffries  . Number of children: 2  . Years of education: Master's +  . Highest education level: Not on file  Occupational History  . Occupation: HIGH SCHOOL ENGLISH     Employer: [employer]    Comment: [employer]  Social Needs  . Financial resource strain: Not on file  . Food insecurity:    Worry: Not on file    Inability: Not on file  . Transportation needs:    Medical: Not on file    Non-medical: Not on file  Tobacco Use  . Smoking status: Never Smoker  . Smokeless tobacco: Never Used  Substance and Sexual Activity  . Alcohol use: Yes    Alcohol/week: 4.0 standard drinks    Types: 4 Standard drinks or equivalent per week    Comment: occasionally  . Drug use: No  . Sexual activity: Yes    Partners: Male    Birth control/protection: Surgical  Lifestyle  . Physical activity:    Days per week: Not on file    Minutes per session: Not on file  . Stress: Not on file  Relationships  . Social connections:    Talks on phone: Not on file    Gets together: Not on file    Attends religious service: Not on file    Active member of club or organization: Not on file    Attends meetings of clubs or organizations: Not on file    Relationship status: Not on file  Other Topics Concern  . Not on file  Social History Narrative   Lives with her husband and their 2 children. Walks 3x's weekly for exercise.    Family History  Problem Relation Age of Onset  . Depression Mother   . Hypertension Father     . Aneurysm Father 76       cerebral  . Diabetes Maternal Grandmother   . Diabetes Maternal Grandfather   . Stroke Paternal Grandfather 51     ROS Review of Systems See HPI Constitution: No fevers or chills No malaise No diaphoresis Skin: No rash or itching Eyes: no blurry vision, no double vision GU: no dysuria or hematuria Neuro: no dizziness or headaches  all others reviewed and negative   Objective: Vitals:   09/07/18 1115  BP: 132/90  Pulse: 82  Resp: 17  Temp: 98.8 F (37.1 C)  TempSrc: Oral  SpO2: 96%  Weight: 196 lb (88.9 kg)  Height: 5\' 7"  (1.702 m)    Physical Exam  Constitutional: She appears well-developed and well-nourished.  HENT:  Head: Normocephalic and atraumatic.  Eyes: Conjunctivae and EOM are normal.  Cardiovascular: Normal rate, regular rhythm and normal heart sounds.  No murmur heard. Pulmonary/Chest: Effort normal and breath sounds normal. No stridor. No respiratory distress. She has no wheezes.  Psychiatric: She has a normal mood and affect. Her behavior is normal. Judgment and thought content normal.    Wrist - normal grip strength, normal range of motion Shoulder - normal range of motion Elbow exam- right -  No effusion noted  -  Tenderness over the right lateral epicondyle with pronation but not supination -  Tenderness with flexion and extension Otherwise normal exam  CLINICAL DATA:  Right elbow injury.  EXAM: RIGHT ELBOW - COMPLETE 3+ VIEW  COMPARISON:  None.  FINDINGS: There is no evidence of fracture, dislocation, or joint effusion. There is no evidence of arthropathy or other focal bone abnormality. Mild soft tissue swelling posteriorly.  IMPRESSION: No acute fracture or dislocation identified about the right elbow.   Electronically Signed   By: Fidela Salisbury M.D.   On: 09/07/2018 12:04  Assessment and Plan Apoorva was seen today for right elbow pain.  Diagnoses and all orders for this  visit:  Right elbow pain -     DG ELBOW COMPLETE RIGHT (3+VIEW); Future  Flu vaccine need -     Flu Vaccine QUAD 36+ mos IM  Injury of right elbow, initial encounter -     DG ELBOW COMPLETE RIGHT (3+VIEW); Future  Other orders -     ibuprofen (ADVIL,MOTRIN) 800 MG tablet; Take 1 tablet (800 mg total) by mouth every 8 (eight) hours. With food for 5 days and after than take every 8 hours as needed  Work note provided Ice and continue the sling as now   [employer]

## 2018-09-14 ENCOUNTER — Encounter: Payer: Self-pay | Admitting: Physician Assistant

## 2018-09-14 ENCOUNTER — Ambulatory Visit: Payer: BC Managed Care – PPO | Admitting: Physician Assistant

## 2018-09-14 ENCOUNTER — Other Ambulatory Visit: Payer: Self-pay

## 2018-09-14 VITALS — BP 134/80 | HR 95 | Temp 98.1°F | Resp 18 | Ht 66.97 in | Wt 195.6 lb

## 2018-09-14 DIAGNOSIS — M25521 Pain in right elbow: Secondary | ICD-10-CM

## 2018-09-14 NOTE — Patient Instructions (Addendum)
Continue ibuprofen and heating pad. Use muscle relaxant as needed. Start exercises below. Follow up with PT.    Elbow and Forearm Exercises Ask your health care provider which exercises are safe for you. Do exercises exactly as told by your health care provider and adjust them as directed. It is normal to feel mild stretching, pulling, tightness, or discomfort as you do these exercises, but you should stop right away if you feel sudden pain or your pain gets worse.Do not begin these exercises until told by your health care provider. RANGE OF MOTION EXERCISES These exercises warm up your muscles and joints and improve the movement and flexibility of your injured elbow and forearm. These exercises also help to relieve pain, numbness, and tingling.These exercises are done using the muscles in your injured elbow and forearm. Exercise A: Elbow Flexion, Active 1. Hold your left / right arm at your side, and bend your elbow as far as you can using your left / right arm muscles. 2. Hold this position for __________ seconds. 3. Slowly return to the starting position. Repeat __________ times. Complete this exercise __________ times a day. Exercise B: Elbow Extension, Active 1. Hold your left / right arm at your side, and straighten your elbow as much as you can using your left / right arm muscles. 2. Hold this position for __________ seconds. 3. Slowly return to the starting position. Repeat __________ times. Complete this exercise __________ times a day. Exercise C: Forearm Rotation, Supination, Active 1. Stand or sit with your elbows at your sides. 2. Bend your left / right elbow to an "L" shape (90 degrees). 3. Turn your palm upward until you feel a gentle stretch on the inside of your forearm. 4. Hold this position for __________ seconds. 5. Slowly release and return to the starting position. Repeat __________ times. Complete this exercise __________ times a day. Exercise D: Forearm Rotation,  Pronation, Active 1. Stand or sit with your elbows at your side. 2. Bend your left / right elbow to an "L" shape (90 degrees). 3. Turn your left / right palm downward until you feel a gentle stretch on the top of your forearm. 4. Hold this position for __________ seconds. 5. Slowly release and return to the starting position. Repeat__________ times. Complete this exercise __________ times a day. STRETCHING EXERCISES These exercises warm up your muscles and joints and improve the movement and flexibility of your injured elbow and forearm. These exercises also help to relieve pain, numbness, and tingling.These exercises are done using your healthy elbow and forearm to help stretch the muscles in your injured elbow and forearm. Exercise E: Elbow Flexion, Active-Assisted  1. Hold your left / right arm at your side, and bend your elbow as much as you can using your left / right arm muscles. 2. Use your other hand to bend your left / right elbow farther. To do this, gently push up on your forearm until you feel a gentle stretch on the back of your elbow. 3. Hold this position for __________ seconds. 4. Slowly return to the starting position. Repeat __________ times. Complete this exercise __________ times a day. Exercise F: Elbow Extension, Active-Assisted  1. Hold your left / right arm at your side, and straighten your elbow as much as you can using your left / right arm muscles. 2. Use your other hand to straighten the left / right elbow farther. To do this, gently push down on your forearm until you feel a gentle stretch on the  inside of your elbow. 3. Hold this position for __________ seconds. 4. Slowly return to the starting position. Repeat __________ times. Complete this exercise __________ times a day. Exercise G: Forearm Rotation, Supination, Active-Assisted  1. Sit with your left / right elbow bent in an "L" shape (90 degrees) with your forearm resting on a table. 2. Keeping your  upper body and shoulder still, rotate your forearm so your left / right palm faces upward. 3. Use your other hand to help rotate your forearm further until you feel a gentle to moderate stretch. 4. Hold this position for __________ seconds. 5. Slowly release the stretch and return to the starting position. Repeat __________ times. Complete this exercise __________ times a day. Exercise H: Forearm Rotation, Pronation, Active-Assisted  1. Sit with your left / right elbow bent in an "L" shape (90 degrees) with your forearm resting on a table. 2. Keeping your upper body and shoulder still, rotate your forearm so your palm faces the tabletop. 3. Use your other hand to help rotate your forearm further until you feel a gentle to moderate stretch. 4. Hold this position for __________ seconds. 5. Slowly release the stretch and return to the starting position. Repeat __________ times. Complete this exercise __________ times a day. Exercise I: Elbow Flexion, Supine, Passive 1. Lie on your back. 2. Extend your left / right arm up in the air, bracing it with your other hand. 3. Let your left / right your hand slowly lower toward your shoulder, while your elbow stays pointed toward the ceiling. You should feel a gentle stretch along the back of your upper arm and elbow. 4. If instructed by your health care provider, you may increase the intensity of your stretch by adding a small wrist weight or hand weight. 5. Hold this position for __________ seconds. 6. Slowly return to the starting position. Repeat __________ times. Complete this exercise __________ times a day. Exercise J: Elbow Extension, Supine, Passive  1. Lie on your back. Make sure that you are in a comfortable position that lets you relax your arm muscles. 2. Place a folded towel under your left / right upper arm so your elbow and shoulder are at the same height. Straighten your left / right arm so your elbow does not rest on the bed or  towel. 3. Let the weight of your hand stretch your elbow. Keep your arm and chest muscles relaxed. You should feel a stretch on the inside of your elbow. 4. If told by your health care provider, you may increase the intensity of your stretch by adding a small wrist weight or hand weight. 5. Hold this position for__________ seconds. 6. Slowly release the stretch. Repeat __________ times. Complete this exercise __________ times a day. STRENGTHENING EXERCISES These exercises build strength and endurance in your elbow and forearm. Endurance is the ability to use your muscles for a long time, even after they get tired. Exercise K: Elbow Flexion, Isometric  1. Stand or sit up straight. 2. Bend your left / right elbow in an "L" shape (90 degrees) and turn your palm up so your forearm is at the height of your waist. 3. Place your other hand on top of your forearm. Gently push down as your left / right arm resists. Push as hard as you can with both arms without causing any pain or movement at your left / right elbow. 4. Hold this position for __________ seconds. 5. Slowly release the tension in both arms. Let your  muscles relax completely before repeating. Repeat __________ times. Complete this exercise __________ times a day. Exercise L: Elbow Extensors, Isometric  1. Stand or sit up straight. 2. Place your left / right arm so your palm faces your abdomen and it is at the height of your waist. 3. Place your other hand on the underside of your forearm. Gently push up as your left / right arm resists. Push as hard as you can with both arms, without causing any pain or movement at your left / right elbow. 4. Hold this position for __________ seconds. 5. Slowly release the tension in both arms. Let your muscles relax completely before repeating. Repeat __________ times. Complete this exercise __________ times a day. Exercise M: Elbow Flexion With Forearm Palm Up  1. Sit upright on a firm chair without  armrests, or stand. 2. Place your left / right arm at your side with your palm facing forward. 3. Holding a __________weight or gripping a rubber exercise band or tubing, bend your elbow to bring your hand toward your shoulder. 4. Hold this position for __________ seconds. 5. Slowly return to the starting position. Repeat __________times. Complete this exercise __________times a day. Exercise N: Elbow Extension  1. Sit on a firm chair without armrests, or stand. 2. Keeping your upper arms at your sides, bring both hands up toward your left / right shoulder while you grip a rubber exercise band or tubing. Your left / right hand should be just below the other hand. 3. Straighten your left / right elbow. 4. Hold this position for __________ seconds. 5. Control the resistance of the band or tubing as your hand returns to your side. Repeat __________times. Complete this exercise __________times a day. Exercise O: Forearm Rotation, Supination  1. Sit with your left / right forearm supported on a table. Keep your elbow at waist height. 2. Rest your hand over the edge of the table with your palm facing down. 3. Gently hold a lightweight hammer. 4. Without moving your elbow, slowly rotate your forearm to turn your palm and hand upward to a "thumbs-up" position. 5. Hold this position for __________ seconds. 6. Slowly return to the starting position. Repeat __________times. Complete this exercise __________times a day. Exercise P: Forearm Rotation, Pronation  1. Sit with your left / right forearm supported on a table. Keep your elbow below shoulder height. 2. Rest your hand over the edge of the table with your palm facing up. 3. Gently hold a lightweight hammer. 4. Without moving your elbow, slowly rotate your forearm to turn your palm and hand upward to a "thumbs-up" position. 5. Hold this position for __________seconds. 6. Slowly return to the starting position. Repeat __________times. Complete  this exercise __________times a day. This information is not intended to replace advice given to you by your health care provider. Make sure you discuss any questions you have with your health care provider. Document Released: 10/01/2005 Document Revised: 03/27/2016 Document Reviewed: 08/12/2015 Elsevier Interactive Patient Education  2018 [employer].   IF you received an x-ray today, you will receive an invoice from [facility]. Please contact [facility] at [phone] with questions or concerns regarding your invoice.   IF you received labwork today, you will receive an invoice from [facility]. Please contact [facility] at [phone] with questions or concerns regarding your invoice.   Our billing staff will not be able to assist you with questions regarding bills from these companies.  You will be contacted with the lab results as soon  as they are available. The fastest way to get your results is to activate your My Chart account. Instructions are located on the last page of this paperwork. If you have not heard from [place] regarding the results in 2 weeks, please contact this office.

## 2018-09-14 NOTE — Progress Notes (Signed)
WILEEN DUNCANSON  MRN: [id number] DOB: 04-30-1971  Subjective:  Stacy Hull is a 47 y.o. female seen in office today for a chief complaint of continued right elbow pain x 10 days.She reports that she opened the dryer on Friday 09/03/2018 and reports that the drum of the barrel was still spinning and one of the paddles hit her elbow. The arm was initially hyperextended by the sheet that was still spinning then the elbow was hit by the elbow. She felt a pop and also had pain Was seen by Dr. Nolon Rod in office for this on 09/07/18. Negative xray. Given Rx for ibuprofen. Today, reports pain has persisted.  Aggravated by washing hair and lifting arm.  Improved with rest, feels great on the weekends and first thing in the morning.  Also tried heating pad with relief.  Ibuprofen 800mg  TID, which does help. Wearing sling most of the time.  Work restrictions end today, needs it extended. Denies numbness, tingling, redness, and warmth.   Review of Systems  Per HPI  Patient Active Problem List   Diagnosis Date Noted  . Sinus congestion 11/18/2017  . Acute non-recurrent maxillary sinusitis 11/18/2017  . S/P hysterectomy 11/21/2016  . Vitamin D deficiency 05/12/2016  . Anxiety state 09/27/2007  . Hyperlipidemia 03/18/2007  . Depression 03/18/2007  . Essential hypertension 03/18/2007    Current Outpatient Medications on File Prior to Visit  Medication Sig Dispense Refill  . ALPRAZolam (XANAX) 0.5 MG tablet TAKE ONE-HALF TO ONE TABLET BY MOUTH THREE TIMES DAILY AS NEEDED FOR ANXIETY 90 tablet 2  . citalopram (CELEXA) 40 MG tablet Take 1 tablet (40 mg total) by mouth daily. 90 tablet 3  . cyclobenzaprine (FLEXERIL) 10 MG tablet TAKE 1/2 TO 1 TABLET(5 TO 10 MG) BY MOUTH THREE TIMES DAILY AS NEEDED 45 tablet 0  . cyclobenzaprine (FLEXERIL) 10 MG tablet TAKE 1/2 TO 1 TABLET(5 TO 10 MG) BY MOUTH THREE TIMES DAILY AS NEEDED 45 tablet 2  . ibuprofen (ADVIL,MOTRIN) 800 MG tablet Take 1 tablet  (800 mg total) by mouth every 8 (eight) hours. With food for 5 days and after than take every 8 hours as needed 60 tablet 2  . lisinopril-hydrochlorothiazide (PRINZIDE,ZESTORETIC) 20-25 MG tablet Take 1 tablet by mouth daily. 90 tablet 3  . Multiple Vitamin (MULTIVITAMIN WITH MINERALS) TABS tablet Take 1 tablet by mouth daily.    . ranitidine (ZANTAC) 150 MG tablet TAKE 1 TABLET(150 MG) BY MOUTH TWICE DAILY 180 tablet 0  . traZODone (DESYREL) 50 MG tablet Take 1-2 tablets (50-100 mg total) by mouth at bedtime. 60 tablet 4  . triamcinolone (NASACORT) 55 MCG/ACT AERO nasal inhaler Place 2 sprays into the nose daily. 1 Inhaler 12   No current facility-administered medications on file prior to visit.     Allergies  Allergen Reactions  . Erythromycin Other (See Comments)    Reaction:  GI upset      Objective:  BP 134/80   Pulse 95   Temp 98.1 F (36.7 C) (Oral)   Resp 18   Ht 5' 6.97" (1.701 m)   Wt 195 lb 9.6 oz (88.7 kg)   LMP 11/03/2016 (Exact Date)   SpO2 97%   BMI 30.66 kg/m   Physical Exam  Constitutional: She is oriented to person, place, and time. She appears well-developed and well-nourished.  HENT:  Head: Normocephalic and atraumatic.  Eyes: Conjunctivae are normal.  Neck: Normal range of motion.  Pulmonary/Chest: Effort normal.  Musculoskeletal:  Right elbow: She exhibits normal range of motion, no swelling and no effusion. Tenderness (with palpation of triceps tendon) found. No radial head, no medial epicondyle, no lateral epicondyle and no olecranon process tenderness noted.       Left elbow: Normal.  Right elbow exam: No overlying erythema, warmth, or ecchymosis. Pain with pronation against resistance, no pain with supination against resistance.  Neurological: She is alert and oriented to person, place, and time.  Reflex Scores:      Tricep reflexes are 2+ on the right side and 2+ on the left side.      Bicep reflexes are 2+ on the right side and 2+ on the  left side. Strength of BUE 5/5. Sensation of BUE intact.  Skin: Skin is warm and dry.  Psychiatric: She has a normal mood and affect.  Vitals reviewed.   Assessment and Plan :  1. Right elbow pain No bony abnormality or overlying skin abnormality noted.  Neurovascularly intact.  Has pain with palpation of triceps tendon.  Suggest resting, ice/heat, over-the-counter NSAIDs as needed and beginning elbow strengthening and range of motion exercises as tolerated.  Chronic use of sling may be hindering healing process.  I have extended work restrictions for an additional week.  Recommend she follow-up with physical therapy if pain persist past 1 week despite treatment management. - Ambulatory referral to Physical Therapy   Tenna Delaine PA-C  [facility] [place] 09/14/2018 12:45 PM

## 2018-09-17 ENCOUNTER — Encounter: Payer: Self-pay | Admitting: Physician Assistant

## 2018-11-15 ENCOUNTER — Other Ambulatory Visit: Payer: Self-pay | Admitting: Family Medicine

## 2018-11-15 NOTE — Telephone Encounter (Signed)
Please advise 

## 2018-11-15 NOTE — Telephone Encounter (Signed)
Requested medication (s) are due for refill today: yes  Requested medication (s) are on the active medication list: yes  Last refill:  10/23/17 for both and each one 90 tabs with 3 refills  Future visit scheduled: no  Notes to clinic:  The cardiovascular: ACE + Diuretic Combos Failed. Both prescriptions expired on 10/23/18.  Requested Prescriptions  Pending Prescriptions Disp Refills   lisinopril-hydrochlorothiazide (PRINZIDE,ZESTORETIC) 20-25 MG tablet [Pharmacy Med Name: LISINOPRIL-HCTZ 20/25MG  TABLETS] 90 tablet 3    Sig: TAKE 1 TABLET BY MOUTH DAILY     Cardiovascular:  ACEI + Diuretic Combos Failed - 11/15/2018  6:17 AM      Failed - Na in normal range and within 180 days    Sodium  Date Value Ref Range Status  07/20/2017 138 134 - 144 mmol/L Final         Failed - K in normal range and within 180 days    Potassium  Date Value Ref Range Status  07/20/2017 3.8 3.5 - 5.2 mmol/L Final         Failed - Cr in normal range and within 180 days    Creat  Date Value Ref Range Status  04/23/2016 0.51 0.50 - 1.10 mg/dL Final   Creatinine, Ser  Date Value Ref Range Status  07/20/2017 0.98 0.57 - 1.00 mg/dL Final         Failed - Ca in normal range and within 180 days    Calcium  Date Value Ref Range Status  07/20/2017 9.6 8.7 - 10.2 mg/dL Final         Passed - Patient is not pregnant      Passed - Last BP in normal range    BP Readings from Last 1 Encounters:  09/14/18 134/80         Passed - Valid encounter within last 6 months    Recent Outpatient Visits          2 months ago Right elbow pain   [facility], [place] D, PA-C   2 months ago Right elbow pain   [facility], Arlie Solomons, MD   4 months ago Nevus of shoulder, right   [facility], Laurey Arrow, MD   7 months ago Back pain, lumbosacral   [facility] [place], PA-C   10 months ago Adjustment disorder with anxious mood   [facility], Laurey Arrow, MD            citalopram (CELEXA) 40 MG tablet [Pharmacy Med Name: CITALOPRAM 40MG  TABLETS] 90 tablet 3    Sig: TAKE 1 TABLET(40 MG) BY MOUTH DAILY     Psychiatry:  Antidepressants - SSRI Passed - 11/15/2018  6:17 AM      Passed - Completed PHQ-2 or PHQ-9 in the last 360 days.      Passed - Valid encounter within last 6 months    Recent Outpatient Visits          2 months ago Right elbow pain   [facility] [place], [place] D, PA-C   2 months ago Right elbow pain   [facility], Arlie Solomons, MD   4 months ago Nevus of shoulder, right   [facility], Laurey Arrow, MD   7 months ago Back pain, lumbosacral   [facility] [place], PA-C   10 months ago Adjustment disorder  with anxious mood   [facility], Laurey Arrow, MD

## 2018-11-22 ENCOUNTER — Other Ambulatory Visit: Payer: Self-pay

## 2018-11-22 ENCOUNTER — Encounter: Payer: Self-pay | Admitting: Family Medicine

## 2018-11-22 ENCOUNTER — Ambulatory Visit: Payer: BC Managed Care – PPO | Admitting: Family Medicine

## 2018-11-22 VITALS — BP 144/90 | HR 86 | Temp 98.6°F | Resp 20 | Ht 67.09 in | Wt 197.4 lb

## 2018-11-22 DIAGNOSIS — J019 Acute sinusitis, unspecified: Secondary | ICD-10-CM

## 2018-11-22 DIAGNOSIS — R059 Cough, unspecified: Secondary | ICD-10-CM

## 2018-11-22 DIAGNOSIS — R05 Cough: Secondary | ICD-10-CM | POA: Diagnosis not present

## 2018-11-22 MED ORDER — AMOXICILLIN-POT CLAVULANATE 875-125 MG PO TABS: 1.0000 | ORAL_TABLET | Freq: Two times a day (BID) | ORAL | 0 refills | Status: DC

## 2018-11-22 MED ORDER — HYDROCODONE-HOMATROPINE 5-1.5 MG/5ML PO SYRP: 5.0000 mL | ORAL_SOLUTION | ORAL | 0 refills | Status: DC | PRN

## 2018-11-22 NOTE — Patient Instructions (Addendum)
Continue to try and get plenty of rest and drink lots of fluids  Take the Augmentin (amoxicillin/clavulanate) 1 pill twice daily for infection  Use the Hycodan cough syrup (hydrocodone) 1 teaspoon every 4-6 hours as needed for cough.  It will make you sedated, so you may not want to use it much in the daytime.  You can take a DM-containing cough syrup in the daytime when you are not using the Hycodan if desired.  Use the albuterol inhaler that you have at home 2 inhalations every 4-6 hours as needed for tightness of the cough and wheezing.  Continue using your vaporizer  You can take a decongestant if necessary also.  Return if worse.   If you have lab work done today you will be contacted with your lab results within the next 2 weeks.  If you have not heard from [place] then please contact us. The fastest way to get your results is to register for My Chart.   IF you received an x-ray today, you will receive an invoice from [facility]. Please contact [facility] at [phone] with questions or concerns regarding your invoice.   IF you received labwork today, you will receive an invoice from [facility]. Please contact [facility] at [phone] with questions or concerns regarding your invoice.   Our billing staff will not be able to assist you with questions regarding bills from these companies.  You will be contacted with the lab results as soon as they are available. The fastest way to get your results is to activate your My Chart account. Instructions are located on the last page of this paperwork. If you have not heard from [place] regarding the results in 2 weeks, please contact this office.    We recommend that you schedule a mammogram for breast cancer screening. Typically, you do not need a referral to do this. Please contact a local imaging center to schedule your mammogram.  [facility] - [phone]  *ask for the Radiology Department [facility] [place] [facility]  ([place]) - [phone] or [phone]  [facility] - [phone] [place] [phone] [facility] - [phone]  *ask for the [facility] - [phone]  *ask for the Radiology Department [facility] - [phone]  *ask for the [place] - [phone]

## 2018-11-22 NOTE — Progress Notes (Signed)
Patient ID: MALU PELLEGRINI, female    DOB: [birth year]  Age: 47 y.o. MRN: [id number]  Chief Complaint  Patient presents with  . Cough    X 1 week  . Sinus Problem    X 1 week    Subjective:   47 year old lady who is here with 1 week history of upper respiratory tract infection.  She is a Radio producer.  She has a family, and others at home have been sick.  She had the upper respiratory symptoms with a bad cold and then cough and now persisted with facial pain and cough.  The mucus is gone from clear to opaque.  She does not smoke.  She did have a flu shot already.  She is getting worse rather than better.  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  Current allergies, medications, problem list, past/family and social histories reviewed.  Objective:  BP (!) 144/90   Pulse 86   Temp 98.6 F (37 C) (Oral)   Resp 20   Ht 5' 7.09" (1.704 m)   Wt 197 lb 6.4 oz (89.5 kg)   LMP 11/03/2016 (Exact Date)   SpO2 96%   BMI 30.84 kg/m   Looks like she does not feel real well.  Her TMs are normal.  Throat clear.  Tender maxillary and frontal sinuses, mostly frontal.  Her neck supple without nodes.  Chest clear to auscultation, however on forced expiration she has wheezing.  Heart regular without murmur.  Assessment & Plan:   Assessment: 1. Acute rhinosinusitis   2. Cough       Plan: We will treat with antibiotics.  She does have an inhaler at home.  See instructions.  No orders of the defined types were placed in this encounter.   Meds ordered this encounter  Medications  . amoxicillin-clavulanate (AUGMENTIN) 875-125 MG tablet    Sig: Take 1 tablet by mouth 2 (two) times daily.    Dispense:  20 tablet    Refill:  0  . HYDROcodone-homatropine (HYCODAN) 5-1.5 MG/5ML syrup    Sig: Take 5 mLs by mouth every 4 (four) hours as needed.    Dispense:  120 mL   Refill:  0         Patient Instructions   Continue to try and get plenty of rest and drink lots of fluids  Take the Augmentin (amoxicillin/clavulanate) 1 pill twice daily for infection  Use the Hycodan cough syrup (hydrocodone) 1 teaspoon every 4-6 hours as needed for cough.  It will make you sedated, so you may not want to use it much in the daytime.  You can take a DM-containing cough syrup in the daytime when you are not using the Hycodan if desired.  Use the albuterol inhaler that you have at home 2 inhalations every 4-6 hours as needed for tightness of the cough and wheezing.  Continue using your vaporizer  You can take a decongestant if necessary also.  Return if worse.   If you have lab work done today you will be contacted with your lab results within the next 2 weeks.  If you have not heard from [place] then please contact us. The fastest way to get your results is to register for My Chart.   IF you received an x-ray today, you will receive an invoice from [facility]. Please contact [facility] at [phone] with  questions or concerns regarding your invoice.   IF you received labwork today, you will receive an invoice from [facility]. Please contact [facility] at [phone] with questions or concerns regarding your invoice.   Our billing staff will not be able to assist you with questions regarding bills from these companies.  You will be contacted with the lab results as soon as they are available. The fastest way to get your results is to activate your My Chart account. Instructions are located on the last page of this paperwork. If you have not heard from [place] regarding the results in 2 weeks, please contact this office.    We recommend that you schedule a mammogram for breast cancer screening. Typically, you do not need a referral to do this. Please contact a local imaging center to schedule your mammogram.  [facility] - [phone]   *ask for the Radiology Department [facility] [place] ([place]) - [phone] or [phone]  [facility] - [phone] [place] [phone] [facility] - [phone]  *ask for the [facility] - [phone]  *ask for the Radiology Department [facility] - [phone]  *ask for the [place] - [phone]    Return if symptoms worsen or fail to improve.   Ruben Reason, MD 11/22/2018

## 2018-12-19 ENCOUNTER — Other Ambulatory Visit: Payer: Self-pay | Admitting: Family Medicine

## 2018-12-20 NOTE — Telephone Encounter (Signed)
Please Advise  Patient is requesting a refill of the following medications: Requested Prescriptions   Pending Prescriptions Disp Refills  . ALPRAZolam (XANAX) 0.5 MG tablet [Pharmacy Med Name: ALPRAZOLAM 0.5MG TABLETS] 90 tablet     Sig: TAKE 1/2 TO 1 TABLET BY MOUTH THREE TIMES DAILY AS NEEDED FOR ANXIETY

## 2019-01-31 ENCOUNTER — Ambulatory Visit: Payer: BC Managed Care – PPO | Admitting: Family Medicine

## 2019-01-31 ENCOUNTER — Encounter: Payer: Self-pay | Admitting: Family Medicine

## 2019-01-31 ENCOUNTER — Other Ambulatory Visit: Payer: Self-pay

## 2019-01-31 VITALS — BP 151/99 | HR 100 | Temp 98.5°F | Ht 67.09 in | Wt 197.5 lb

## 2019-01-31 DIAGNOSIS — F411 Generalized anxiety disorder: Secondary | ICD-10-CM

## 2019-01-31 DIAGNOSIS — R05 Cough: Secondary | ICD-10-CM | POA: Diagnosis not present

## 2019-01-31 DIAGNOSIS — R059 Cough, unspecified: Secondary | ICD-10-CM

## 2019-01-31 DIAGNOSIS — R6889 Other general symptoms and signs: Secondary | ICD-10-CM

## 2019-01-31 LAB — POC INFLUENZA A&B (BINAX/QUICKVUE)
Influenza A, POC: NEGATIVE
Influenza B, POC: NEGATIVE

## 2019-01-31 MED ORDER — HYDROCODONE-HOMATROPINE 5-1.5 MG/5ML PO SYRP: 5.0000 mL | ORAL_SOLUTION | ORAL | 0 refills | Status: DC | PRN

## 2019-01-31 MED ORDER — ALPRAZOLAM 0.5 MG PO TABS: 0.5000 mg | ORAL_TABLET | Freq: Every day | ORAL | 0 refills | Status: DC | PRN

## 2019-01-31 MED ORDER — BENZONATATE 100 MG PO CAPS: 100.0000 mg | ORAL_CAPSULE | Freq: Three times a day (TID) | ORAL | 0 refills | Status: DC | PRN

## 2019-01-31 NOTE — Progress Notes (Signed)
[id number]

## 2019-01-31 NOTE — Progress Notes (Signed)
3/2/20203:08 PM  Stacy Hull 1971-01-23, 48 y.o. female [id number]  Chief Complaint  Patient presents with  . Sinus Problem    having productive cough, chill and body ache  . care gaps    only wants to deal with the reason for the office visit today    HPI:   Patient is a 48 y.o. 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 0 0 No No No  Number falls in past yr: 0 - - - -  Injury with Fall? 0 - - - -     Depression screen [facility] 2/9 01/31/2019 11/22/2018 09/14/2018  Decreased Interest 0 0 2  Down, Depressed, Hopeless 0 0 2  PHQ - 2 Score 0 0 4  Altered sleeping - - 3  Tired, decreased energy - - 1  Change in appetite - - 0  Feeling bad or failure about yourself  - - 2  Trouble concentrating - - 1  Moving slowly or fidgety/restless - - 1  Suicidal thoughts - - 0  PHQ-9 Score - - 12  Difficult doing work/chores - - Very difficult  Some recent data might be hidden    Allergies  Allergen Reactions  . Erythromycin Other (See Comments)    Reaction:  GI upset     Prior to Admission medications   Medication Sig Start  Date End Date Taking? Authorizing Provider  ALPRAZolam Duanne Moron) 0.5 MG tablet TAKE ONE-HALF TO ONE TABLET BY MOUTH THREE TIMES DAILY AS NEEDED FOR ANXIETY 08/13/18   Shawnee Knapp, MD  citalopram (CELEXA) 40 MG tablet TAKE 1 TABLET(40 MG) BY MOUTH DAILY 11/18/18   Shawnee Knapp, MD  ibuprofen (ADVIL,MOTRIN) 800 MG tablet Take 1 tablet (800 mg total) by mouth every 8 (eight) hours. With food for 5 days and after than take every 8 hours as needed 09/07/18   Forrest Moron, MD  lisinopril-hydrochlorothiazide (PRINZIDE,ZESTORETIC) 20-25 MG tablet TAKE 1 TABLET BY MOUTH DAILY 11/18/18   Shawnee Knapp, MD  Multiple Vitamin (MULTIVITAMIN WITH MINERALS) TABS tablet Take 1 tablet by mouth daily.    [provider]  traZODone (DESYREL) 50 MG tablet Take 1-2 tablets (50-100 mg total) by mouth at bedtime. 01/16/18   Shawnee Knapp, MD  triamcinolone (NASACORT) 55 MCG/ACT AERO nasal inhaler Place 2 sprays into the nose daily. 11/18/17   Horald Pollen, MD    Past Medical History:  Diagnosis Date  . Allergic rhinitis   . Anxiety   . Asthma    after URI  . Depression   . GERD (gastroesophageal reflux disease)    takes TUMS  . HTN (hypertension)   .  Hyperlipidemia   . Hyperparathyroidism ([place])   . Pneumonia     Past Surgical History:  Procedure Laterality Date  . CERVICAL CONE BIOPSY    . CESAREAN SECTION  2005  . HYSTEROSCOPY WITH NOVASURE  11/26/2012   Procedure: HYSTEROSCOPY WITH NOVASURE;  Surgeon: Marvene Staff, MD;  Location: [facility];  Service: Gynecology;  Laterality: N/A;  . LAPAROSCOPIC LYSIS OF ADHESIONS N/A 11/21/2016   Procedure: EXTENSIVE LAPAROSCOPIC LYSIS OF ADHESIONS;  Surgeon: Servando Salina, MD;  Location: [facility];  Service: Gynecology;  Laterality: N/A;  . LAPAROSCOPY N/A 11/21/2016   Procedure: LAPAROSCOPY DIAGNOSTIC, LAPAROSCOPIC REMOVAL OF FOREIGN BODY;  Surgeon: Ralene Ok, MD;  Location: [facility];  Service: General;  Laterality: N/A;  . PARATHYROIDECTOMY   1999   benign  . REPAIR VAGINAL CUFF N/A 12/14/2016   Procedure: REPAIR VAGINAL CUFF;  Surgeon: Brien Few, MD;  Location: [facility];  Service: Gynecology;  Laterality: N/A;  . ROBOTIC ASSISTED LAPAROSCOPIC OVARIAN CYSTECTOMY Right 10/01/2015   Procedure: ROBOTIC ASSISTED LAPAROSCOPIC BILATERAL SALPINGECTOMY WITH LYSIS OF ADHESIONS AND RIGHT ADNEXAL CYST;  Surgeon: Princess Bruins, MD;  Location: [facility];  Service: Gynecology;  Laterality: Right;  POSSIBLE robot .  DO NOT DRAPE THE ROBOT.....she will use the laparoscopic camera to start.    . ROBOTIC ASSISTED TOTAL HYSTERECTOMY WITH SALPINGECTOMY N/A 11/21/2016   Procedure: ROBOTIC ASSISTED TOTAL HYSTERECTOMY WITH  EXTENSIVE LYSIS OF ADHESIONS;  Surgeon: Servando Salina, MD;  Location: [facility];  Service: Gynecology;  Laterality: N/A;  . TUBAL LIGATION    . UMBILICAL HERNIA REPAIR    . WISDOM TOOTH EXTRACTION      Social History   Tobacco Use  . Smoking status: Never Smoker  . Smokeless tobacco: Never Used  Substance Use Topics  . Alcohol use: Yes    Alcohol/week: 4.0 standard drinks    Types: 4 Standard drinks or equivalent per week    Comment: occasionally    Family History  Problem Relation Age of Onset  . Depression Mother   . Hypertension Father   . Aneurysm Father 23       cerebral  . Diabetes Maternal Grandmother   . Diabetes Maternal Grandfather   . Stroke Paternal Grandfather 55    ROS Per hpi  OBJECTIVE:  Blood pressure (!) 151/99, pulse 100, temperature 98.5 F (36.9 C), temperature source Oral, height 5' 7.09" (1.704 m), weight 197 lb 8 oz (89.6 kg), last menstrual period 11/03/2016, SpO2 100 %. Body mass index is 30.85 kg/m.   Physical Exam Vitals signs and nursing note reviewed.  Constitutional:      Appearance: She is well-developed. She is ill-appearing.  HENT:     Head: Normocephalic and atraumatic.     Right Ear: Hearing, tympanic membrane, ear canal and external ear normal.     Left Ear:  Hearing, tympanic membrane, ear canal and external ear normal.     Nose: Rhinorrhea present. Rhinorrhea is clear.     Right Turbinates: Swollen and pale.     Left Turbinates: Swollen and pale.     Mouth/Throat:     Pharynx: Posterior oropharyngeal erythema present. No pharyngeal swelling or oropharyngeal exudate.     Tonsils: No tonsillar exudate. Swelling: 0 on the right. 0 on the left.  Eyes:     Conjunctiva/sclera: Conjunctivae normal.     Pupils: Pupils are equal, round, and reactive to light.  Neck:     Musculoskeletal: Neck supple.  Cardiovascular:     Rate and Rhythm:  Normal rate and regular rhythm.     Heart sounds: Normal heart sounds. No murmur. No friction rub. No gallop.   Pulmonary:     Effort: Pulmonary effort is normal.     Breath sounds: Normal breath sounds. No wheezing or rales.  Lymphadenopathy:     Cervical: No cervical adenopathy.  Skin:    General: Skin is warm and dry.  Neurological:     Mental Status: She is alert and oriented to person, place, and time.     Results for orders placed or performed in visit on 01/31/19 (from the past 24 hour(s))  POC Influenza A&B(BINAX/QUICKVUE)     Status: None   Collection Time: 01/31/19  3:20 PM  Result Value Ref Range   Influenza A, POC Negative Negative   Influenza B, POC Negative Negative    ASSESSMENT and PLAN  1. Flu-like symptoms Discussed supportive measures: increase hydration, rest, OTC medications, etc. RTC precautions discussed. - POC Influenza A&B(BINAX/QUICKVUE) - negative  2. Cough - HYDROcodone-homatropine (HYCODAN) 5-1.5 MG/5ML syrup; Take 5 mLs by mouth every 4 (four) hours as needed.  3. GAD (generalized anxiety disorder) Stable. Refilled requested medications  Other orders - benzonatate (TESSALON) 100 MG capsule; Take 1-2 capsules (100-200 mg total) by mouth 3 (three) times daily as needed for cough. - ALPRAZolam (XANAX) 0.5 MG tablet; Take 1 tablet (0.5 mg total) by mouth daily as needed  for anxiety.    Return in about 4 weeks (around 02/28/2019) for BP check.    Rutherford Guys, MD [facility] [place], [place] [address] Ph.  [phone] Fax [phone]

## 2019-02-08 ENCOUNTER — Other Ambulatory Visit: Payer: Self-pay

## 2019-02-08 ENCOUNTER — Ambulatory Visit (INDEPENDENT_AMBULATORY_CARE_PROVIDER_SITE_OTHER): Payer: BC Managed Care – PPO

## 2019-02-08 ENCOUNTER — Encounter: Payer: Self-pay | Admitting: Family Medicine

## 2019-02-08 ENCOUNTER — Ambulatory Visit: Payer: BC Managed Care – PPO | Admitting: Family Medicine

## 2019-02-08 VITALS — BP 143/93 | HR 96 | Temp 99.3°F | Resp 18 | Ht 67.09 in | Wt 195.4 lb

## 2019-02-08 DIAGNOSIS — Z1231 Encounter for screening mammogram for malignant neoplasm of breast: Secondary | ICD-10-CM | POA: Diagnosis not present

## 2019-02-08 DIAGNOSIS — J4521 Mild intermittent asthma with (acute) exacerbation: Secondary | ICD-10-CM | POA: Diagnosis not present

## 2019-02-08 DIAGNOSIS — R05 Cough: Secondary | ICD-10-CM | POA: Diagnosis not present

## 2019-02-08 DIAGNOSIS — R059 Cough, unspecified: Secondary | ICD-10-CM

## 2019-02-08 DIAGNOSIS — J0110 Acute frontal sinusitis, unspecified: Secondary | ICD-10-CM | POA: Diagnosis not present

## 2019-02-08 LAB — POCT CBC
Granulocyte percent: 56 %G (ref 37–80)
HCT, POC: 45.6 % — AB (ref 29–41)
Hemoglobin: 16 g/dL — AB (ref 11–14.6)
Lymph, poc: 4.1 — AB (ref 0.6–3.4)
MCH, POC: 33.1 pg — AB (ref 27–31.2)
MCHC: 35.2 g/dL (ref 31.8–35.4)
MCV: 93.9 fL (ref 76–111)
MID (cbc): 0.7 (ref 0–0.9)
MPV: 6.9 fL (ref 0–99.8)
POC Granulocyte: 6.2 (ref 2–6.9)
POC LYMPH PERCENT: 37.3 %L (ref 10–50)
POC MID %: 6.7 %M (ref 0–12)
Platelet Count, POC: 344 10*3/uL (ref 142–424)
RBC: 4.85 M/uL (ref 4.04–5.48)
RDW, POC: 12 %
WBC: 11.1 10*3/uL — AB (ref 4.6–10.2)

## 2019-02-08 MED ORDER — PREDNISONE 20 MG PO TABS: 40.0000 mg | ORAL_TABLET | Freq: Every day | ORAL | 0 refills | Status: DC

## 2019-02-08 MED ORDER — AMOXICILLIN-POT CLAVULANATE 875-125 MG PO TABS: 1.0000 | ORAL_TABLET | Freq: Two times a day (BID) | ORAL | 0 refills | Status: DC

## 2019-02-08 NOTE — Progress Notes (Signed)
3/10/20204:53 PM  Stacy Hull 11-May-1971, 48 y.o. female [id number]  Chief Complaint  Patient presents with  . Cough    follow up says it is worse     HPI:   Patient is a 48 y.o. female with past medical history significant for HTN and anxiety who presents today for cough  Seen by me a week ago for flu like sx  Flu negative at that time Over the weekend she felt it she was getting better, still having nocturnal cough But when she went back to work started to have sign worsening cough, productive, sweats, cold Having to use her albuterol more Having some sinus pressure Having lots of PND, mild itchy ears, taking allergy meds Feeling very tired  Fall Risk  02/08/2019 01/31/2019 11/22/2018 09/14/2018 09/07/2018  Falls in the past year? 0 0 0 No No  Number falls in past yr: - 0 - - -  Injury with Fall? - 0 - - -  Follow up Falls evaluation completed - - - -     Depression screen [facility] 2/9 02/08/2019 01/31/2019 11/22/2018  Decreased Interest 0 0 0  Down, Depressed, Hopeless 0 0 0  PHQ - 2 Score 0 0 0  Altered sleeping - - -  Tired, decreased energy - - -  Change in appetite - - -  Feeling bad or failure about yourself  - - -  Trouble concentrating - - -  Moving slowly or fidgety/restless - - -  Suicidal thoughts - - -  PHQ-9 Score - - -  Difficult doing work/chores - - -  Some recent data might be hidden    Allergies  Allergen Reactions  . Erythromycin Other (See Comments)    Reaction:  GI upset     Prior to Admission medications   Medication Sig Start Date End Date Taking? Authorizing Provider  ALPRAZolam Duanne Moron) 0.5 MG tablet Take 1 tablet (0.5 mg total) by mouth daily as needed for anxiety. 01/31/19  Yes Rutherford Guys, MD  benzonatate (TESSALON) 100 MG capsule Take 1-2 capsules (100-200 mg total) by mouth 3 (three) times daily as needed for cough. 01/31/19  Yes Rutherford Guys, MD  citalopram (CELEXA) 40 MG tablet TAKE 1 TABLET(40 MG) BY MOUTH DAILY 11/18/18   Yes Shawnee Knapp, MD  HYDROcodone-homatropine [facility]) 5-1.5 MG/5ML syrup Take 5 mLs by mouth every 4 (four) hours as needed. 01/31/19  Yes Rutherford Guys, MD  ibuprofen (ADVIL,MOTRIN) 800 MG tablet Take 1 tablet (800 mg total) by mouth every 8 (eight) hours. With food for 5 days and after than take every 8 hours as needed 09/07/18  Yes Forrest Moron, MD  lisinopril-hydrochlorothiazide (PRINZIDE,ZESTORETIC) 20-25 MG tablet TAKE 1 TABLET BY MOUTH DAILY 11/18/18  Yes Shawnee Knapp, MD  Multiple Vitamin (MULTIVITAMIN WITH MINERALS) TABS tablet Take 1 tablet by mouth daily.   Yes [provider]  traZODone (DESYREL) 50 MG tablet Take 1-2 tablets (50-100 mg total) by mouth at bedtime. 01/16/18  Yes Shawnee Knapp, MD  triamcinolone (NASACORT) 55 MCG/ACT AERO nasal inhaler Place 2 sprays into the nose daily. 11/18/17  Yes Horald Pollen, MD    Past Medical History:  Diagnosis Date  . Allergic rhinitis   . Anxiety   . Asthma    after URI  . Depression   . GERD (gastroesophageal reflux disease)    takes TUMS  . HTN (hypertension)   . Hyperlipidemia   . Hyperparathyroidism ([place])   .  Pneumonia     Past Surgical History:  Procedure Laterality Date  . CERVICAL CONE BIOPSY    . CESAREAN SECTION  2005  . HYSTEROSCOPY WITH NOVASURE  11/26/2012   Procedure: HYSTEROSCOPY WITH NOVASURE;  Surgeon: Marvene Staff, MD;  Location: [facility];  Service: Gynecology;  Laterality: N/A;  . LAPAROSCOPIC LYSIS OF ADHESIONS N/A 11/21/2016   Procedure: EXTENSIVE LAPAROSCOPIC LYSIS OF ADHESIONS;  Surgeon: Servando Salina, MD;  Location: [facility];  Service: Gynecology;  Laterality: N/A;  . LAPAROSCOPY N/A 11/21/2016   Procedure: LAPAROSCOPY DIAGNOSTIC, LAPAROSCOPIC REMOVAL OF FOREIGN BODY;  Surgeon: Ralene Ok, MD;  Location: [facility];  Service: General;  Laterality: N/A;  . PARATHYROIDECTOMY  1999   benign  . REPAIR VAGINAL CUFF N/A 12/14/2016   Procedure: REPAIR VAGINAL CUFF;  Surgeon: Brien Few, MD;  Location: [facility];  Service: Gynecology;  Laterality: N/A;  . ROBOTIC ASSISTED LAPAROSCOPIC OVARIAN CYSTECTOMY Right 10/01/2015   Procedure: ROBOTIC ASSISTED LAPAROSCOPIC BILATERAL SALPINGECTOMY WITH LYSIS OF ADHESIONS AND RIGHT ADNEXAL CYST;  Surgeon: Princess Bruins, MD;  Location: [facility];  Service: Gynecology;  Laterality: Right;  POSSIBLE robot .  DO NOT DRAPE THE ROBOT.....she will use the laparoscopic camera to start.    . ROBOTIC ASSISTED TOTAL HYSTERECTOMY WITH SALPINGECTOMY N/A 11/21/2016   Procedure: ROBOTIC ASSISTED TOTAL HYSTERECTOMY WITH  EXTENSIVE LYSIS OF ADHESIONS;  Surgeon: Servando Salina, MD;  Location: [facility];  Service: Gynecology;  Laterality: N/A;  . TUBAL LIGATION    . UMBILICAL HERNIA REPAIR    . WISDOM TOOTH EXTRACTION      Social History   Tobacco Use  . Smoking status: Never Smoker  . Smokeless tobacco: Never Used  Substance Use Topics  . Alcohol use: Yes    Alcohol/week: 4.0 standard drinks    Types: 4 Standard drinks or equivalent per week    Comment: occasionally    Family History  Problem Relation Age of Onset  . Depression Mother   . Hypertension Father   . Aneurysm Father 18       cerebral  . Diabetes Maternal Grandmother   . Diabetes Maternal Grandfather   . Stroke Paternal Grandfather 86    ROS Per hpi  OBJECTIVE:  Blood pressure (!) 143/93, pulse 96, temperature 99.3 F (37.4 C), temperature source Oral, resp. rate 18, height 5' 7.09" (1.704 m), weight 195 lb 6.4 oz (88.6 kg), last menstrual period 11/03/2016, SpO2 98 %. Body mass index is 30.52 kg/m.   Physical Exam Vitals signs and nursing note reviewed.  Constitutional:      Appearance: She is well-developed. She is ill-appearing and diaphoretic.  HENT:     Head: Normocephalic and atraumatic.     Right Ear: Hearing, tympanic membrane, ear canal and external ear normal.     Left Ear: Hearing, tympanic membrane, ear canal and external ear normal.     Nose:      Right Turbinates: Swollen and pale.     Left Turbinates: Swollen and pale.     Right Sinus: Frontal sinus tenderness present.     Left Sinus: Frontal sinus tenderness present.     Mouth/Throat:     Pharynx: Posterior oropharyngeal erythema present. No oropharyngeal exudate.  Eyes:     Conjunctiva/sclera: Conjunctivae normal.     Pupils: Pupils are equal, round, and reactive to light.  Neck:     Musculoskeletal: Neck supple.  Cardiovascular:     Rate and Rhythm: Normal rate and regular rhythm.  Heart sounds: Normal heart sounds. No murmur. No friction rub. No gallop.   Pulmonary:     Effort: Pulmonary effort is normal.     Breath sounds: Wheezing and rhonchi present. No rales.  Lymphadenopathy:     Cervical: Cervical adenopathy present.  Skin:    General: Skin is warm.  Neurological:     Mental Status: She is alert and oriented to person, place, and time.     Results for orders placed or performed in visit on 02/08/19 (from the past 24 hour(s))  POCT CBC     Status: Abnormal   Collection Time: 02/08/19  5:16 PM  Result Value Ref Range   WBC 11.1 (A) 4.6 - 10.2 K/uL   Lymph, poc 4.1 (A) 0.6 - 3.4   POC LYMPH PERCENT 37.3 10 - 50 %L   MID (cbc) 0.7 0 - 0.9   POC MID % 6.7 0 - 12 %M   POC Granulocyte 6.2 2 - 6.9   Granulocyte percent 56.0 37 - 80 %G   RBC 4.85 4.04 - 5.48 M/uL   Hemoglobin 16.0 (A) 11 - 14.6 g/dL   HCT, POC 45.6 (A) 29 - 41 %   MCV 93.9 76 - 111 fL   MCH, POC 33.1 (A) 27 - 31.2 pg   MCHC 35.2 31.8 - 35.4 g/dL   RDW, POC 12.0 %   Platelet Count, POC 344 142 - 424 K/uL   MPV 6.9 0 - 99.8 fL    Dg Chest 2 View  Result Date: 02/08/2019 CLINICAL DATA:  Productive cough for 1 week. EXAM: CHEST - 2 VIEW COMPARISON:  09/10/2015 FINDINGS: Cardiomediastinal silhouette is normal. Mediastinal contours appear intact. There is no evidence of focal airspace consolidation, pleural effusion or pneumothorax. Osseous structures are without acute abnormality. Soft  tissues are grossly normal. Postsurgical changes at the left thoracic inlet. IMPRESSION: No active cardiopulmonary disease. Electronically Signed   By: Fidela Salisbury M.D.   On: 02/08/2019 17:36     ASSESSMENT and PLAN  1. Acute non-recurrent frontal sinusitis 2. Mild intermittent asthma with acute exacerbation Discussed supportive measures, new meds r/se/b and RTC precautions. Patient educational handout given.  3. Cough - POCT CBC - DG Chest 2 View; Future  4. Encounter for screening mammogram for breast cancer - MM DIGITAL SCREENING BILATERAL; Future  Other orders - amoxicillin-clavulanate (AUGMENTIN) 875-125 MG tablet; Take 1 tablet by mouth 2 (two) times daily. - predniSONE (DELTASONE) 20 MG tablet; Take 2 tablets (40 mg total) by mouth daily with breakfast.    Return if symptoms worsen or fail to improve.    Rutherford Guys, MD Primary Care at [facility] [place], [place] [address] Ph.  [phone] Fax [phone]

## 2019-02-08 NOTE — Patient Instructions (Signed)
° ° ° °  If you have lab work done today you will be contacted with your lab results within the next 2 weeks.  If you have not heard from us then please contact us. The fastest way to get your results is to register for My Chart. ° ° °IF you received an x-ray today, you will receive an invoice from [facility]. Please contact [facility] at [phone] with questions or concerns regarding your invoice.  ° °IF you received labwork today, you will receive an invoice from [facility]. Please contact [facility] at [phone] with questions or concerns regarding your invoice.  ° °Our billing staff will not be able to assist you with questions regarding bills from these companies. ° °You will be contacted with the lab results as soon as they are available. The fastest way to get your results is to activate your My Chart account. Instructions are located on the last page of this paperwork. If you have not heard from us regarding the results in 2 weeks, please contact this office. °  ° ° ° °

## 2019-03-02 ENCOUNTER — Other Ambulatory Visit: Payer: Self-pay

## 2019-03-02 DIAGNOSIS — R7309 Other abnormal glucose: Secondary | ICD-10-CM

## 2019-03-02 DIAGNOSIS — I10 Essential (primary) hypertension: Secondary | ICD-10-CM

## 2019-03-09 ENCOUNTER — Other Ambulatory Visit: Payer: Self-pay

## 2019-03-09 ENCOUNTER — Telehealth: Payer: BC Managed Care – PPO | Admitting: Family Medicine

## 2019-03-18 ENCOUNTER — Other Ambulatory Visit: Payer: Self-pay | Admitting: Family Medicine

## 2019-03-18 NOTE — Telephone Encounter (Signed)
Requested medication (s) are due for refill today:  yes  Requested medication (s) are on the active medication list:  yes  Future visit scheduled:  No   Last Refill: 01/31/19; #30; no refills  See note.   Requested Prescriptions  Pending Prescriptions Disp Refills   ALPRAZolam (XANAX) 0.5 MG tablet 30 tablet 0    Sig: Take 1 tablet (0.5 mg total) by mouth daily as needed for anxiety.     Not Delegated - Psychiatry:  Anxiolytics/Hypnotics Failed - 03/18/2019  7:56 AM      Failed - This refill cannot be delegated      Failed - Urine Drug Screen completed in last 360 days.      Failed - Valid encounter within last 6 months    Recent Outpatient Visits          1 month ago Acute non-recurrent frontal sinusitis   [facility], Lilia Argue, MD   1 month ago Flu-like symptoms   [facility] at [facility], Lilia Argue, MD   3 months ago Acute rhinosinusitis   Primary Care at [facility], Fenton Malling, MD   6 months ago Right elbow pain   Primary Care at [facility], [place] D, PA-C   6 months ago Right elbow pain   Primary Care at [facility], Arlie Solomons, MD
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
## 2019-03-18 NOTE — Telephone Encounter (Signed)
Patient last seen last month

## 2019-03-21 MED ORDER — ALPRAZOLAM 0.5 MG PO TABS: 0.5000 mg | ORAL_TABLET | Freq: Every day | ORAL | 0 refills | Status: DC | PRN

## 2019-03-21 NOTE — Telephone Encounter (Signed)
pmp reviewd, appropriate meds refilled

## 2019-03-22 ENCOUNTER — Telehealth (INDEPENDENT_AMBULATORY_CARE_PROVIDER_SITE_OTHER): Payer: BC Managed Care – PPO | Admitting: Family Medicine

## 2019-03-22 ENCOUNTER — Other Ambulatory Visit: Payer: Self-pay

## 2019-03-22 DIAGNOSIS — F411 Generalized anxiety disorder: Secondary | ICD-10-CM | POA: Diagnosis not present
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
## 2019-03-22 NOTE — Progress Notes (Signed)
Pt states she has had some Death (mom) in her family and lost her home in the last two weeks. She would like to speak with the doctor about her medications. Pt states she feels her depression and Anxiety has been worst and would like to go over medication and make sure she is taking the correct amount.

## 2019-03-23 ENCOUNTER — Other Ambulatory Visit: Payer: Self-pay | Admitting: Family Medicine

## 2019-04-28 ENCOUNTER — Ambulatory Visit: Payer: BC Managed Care – PPO | Admitting: Family Medicine

## 2019-04-28 ENCOUNTER — Other Ambulatory Visit: Payer: Self-pay | Admitting: Family Medicine

## 2019-04-28 NOTE — Telephone Encounter (Signed)
Requested medication (s) are due for refill today: Yes  Requested medication (s) are on the active medication list: Yes  Last refill:  03/21/19  Future visit scheduled: No  Notes to clinic:  Unable to refill, cannot delegate     Requested Prescriptions  Pending Prescriptions Disp Refills   ALPRAZolam (XANAX) 0.5 MG tablet [Pharmacy Med Name: ALPRAZOLAM 0.5MG  TABLETS] 30 tablet     Sig: TAKE 1 TABLET(0.5 MG) BY MOUTH DAILY AS NEEDED FOR ANXIETY     Not Delegated - Psychiatry:  Anxiolytics/Hypnotics Failed - 04/28/2019  4:17 PM      Failed - This refill cannot be delegated      Failed - Urine Drug Screen completed in last 360 days.      Failed - Valid encounter within last 6 months    Recent Outpatient Visits          2 months ago Acute non-recurrent frontal sinusitis   [facility] at [facility], Lilia Argue, MD   2 months ago Flu-like symptoms   [facility] at [facility], Lilia Argue, MD   5 months ago Acute rhinosinusitis   Primary Care at [facility], Fenton Malling, MD   7 months ago Right elbow pain   Primary Care at [facility], [place] D, PA-C   7 months ago Right elbow pain   Primary Care at [facility], Arlie Solomons, MD

## 2019-04-29 ENCOUNTER — Other Ambulatory Visit: Payer: Self-pay | Admitting: Physician Assistant

## 2019-05-13 ENCOUNTER — Ambulatory Visit: Payer: BC Managed Care – PPO | Admitting: Family Medicine

## 2019-05-13 ENCOUNTER — Encounter: Payer: Self-pay | Admitting: Family Medicine

## 2019-05-13 ENCOUNTER — Other Ambulatory Visit: Payer: Self-pay

## 2019-05-13 VITALS — BP 137/89 | HR 89 | Temp 98.8°F | Ht 67.09 in | Wt 195.0 lb

## 2019-05-13 DIAGNOSIS — F430 Acute stress reaction: Secondary | ICD-10-CM

## 2019-05-13 DIAGNOSIS — R109 Unspecified abdominal pain: Secondary | ICD-10-CM | POA: Diagnosis not present

## 2019-05-13 DIAGNOSIS — F4321 Adjustment disorder with depressed mood: Secondary | ICD-10-CM

## 2019-05-13 DIAGNOSIS — F411 Generalized anxiety disorder: Secondary | ICD-10-CM | POA: Diagnosis not present

## 2019-05-13 LAB — HM MAMMOGRAPHY

## 2019-05-13 MED ORDER — PRAZOSIN HCL 1 MG PO CAPS: 1.0000 mg | ORAL_CAPSULE | Freq: Every day | ORAL | 1 refills | Status: DC

## 2019-05-13 MED ORDER — CYCLOBENZAPRINE HCL 10 MG PO TABS: 10.0000 mg | ORAL_TABLET | Freq: Three times a day (TID) | ORAL | 0 refills | Status: DC | PRN

## 2019-05-13 NOTE — Patient Instructions (Signed)
° ° ° °  If you have lab work done today you will be contacted with your lab results within the next 2 weeks.  If you have not heard from us then please contact us. The fastest way to get your results is to register for My Chart. ° ° °IF you received an x-ray today, you will receive an invoice from [facility]. Please contact [facility] at [phone] with questions or concerns regarding your invoice.  ° °IF you received labwork today, you will receive an invoice from [facility]. Please contact [facility] at [phone] with questions or concerns regarding your invoice.  ° °Our billing staff will not be able to assist you with questions regarding bills from these companies. ° °You will be contacted with the lab results as soon as they are available. The fastest way to get your results is to activate your My Chart account. Instructions are located on the last page of this paperwork. If you have not heard from us regarding the results in 2 weeks, please contact this office. °  ° ° ° °
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
## 2019-05-19 ENCOUNTER — Ambulatory Visit (INDEPENDENT_AMBULATORY_CARE_PROVIDER_SITE_OTHER): Payer: BC Managed Care – PPO | Admitting: Psychology

## 2019-05-19 DIAGNOSIS — F4323 Adjustment disorder with mixed anxiety and depressed mood: Secondary | ICD-10-CM

## 2019-05-24 ENCOUNTER — Ambulatory Visit (INDEPENDENT_AMBULATORY_CARE_PROVIDER_SITE_OTHER): Payer: BC Managed Care – PPO | Admitting: Psychology

## 2019-05-24 DIAGNOSIS — F4323 Adjustment disorder with mixed anxiety and depressed mood: Secondary | ICD-10-CM | POA: Diagnosis not present

## 2019-06-10 ENCOUNTER — Ambulatory Visit (INDEPENDENT_AMBULATORY_CARE_PROVIDER_SITE_OTHER): Payer: BC Managed Care – PPO | Admitting: Psychology

## 2019-06-10 DIAGNOSIS — F4323 Adjustment disorder with mixed anxiety and depressed mood: Secondary | ICD-10-CM | POA: Diagnosis not present

## 2019-06-14 ENCOUNTER — Ambulatory Visit: Payer: BC Managed Care – PPO | Admitting: Family Medicine

## 2019-06-14 ENCOUNTER — Encounter: Payer: Self-pay | Admitting: Family Medicine

## 2019-06-14 ENCOUNTER — Other Ambulatory Visit: Payer: Self-pay

## 2019-06-14 VITALS — BP 127/85 | HR 112 | Temp 98.7°F | Ht 67.09 in | Wt 192.0 lb

## 2019-06-14 DIAGNOSIS — E7800 Pure hypercholesterolemia, unspecified: Secondary | ICD-10-CM | POA: Diagnosis not present

## 2019-06-14 DIAGNOSIS — F411 Generalized anxiety disorder: Secondary | ICD-10-CM | POA: Diagnosis not present

## 2019-06-14 DIAGNOSIS — R7309 Other abnormal glucose: Secondary | ICD-10-CM | POA: Diagnosis not present

## 2019-06-14 DIAGNOSIS — I10 Essential (primary) hypertension: Secondary | ICD-10-CM

## 2019-06-14 DIAGNOSIS — F3342 Major depressive disorder, recurrent, in full remission: Secondary | ICD-10-CM

## 2019-06-14 MED ORDER — CITALOPRAM HYDROBROMIDE 40 MG PO TABS: ORAL_TABLET | ORAL | 3 refills | Status: DC

## 2019-06-14 MED ORDER — LISINOPRIL-HYDROCHLOROTHIAZIDE 20-25 MG PO TABS: 1.0000 | ORAL_TABLET | Freq: Every day | ORAL | 3 refills | Status: DC

## 2019-06-14 MED ORDER — ALPRAZOLAM 0.5 MG PO TABS: ORAL_TABLET | ORAL | 0 refills | Status: DC

## 2019-06-14 NOTE — Patient Instructions (Signed)
° ° ° °  If you have lab work done today you will be contacted with your lab results within the next 2 weeks.  If you have not heard from us then please contact us. The fastest way to get your results is to register for My Chart. ° ° °IF you received an x-ray today, you will receive an invoice from [facility]. Please contact [facility] at [phone] with questions or concerns regarding your invoice.  ° °IF you received labwork today, you will receive an invoice from [facility]. Please contact [facility] at [phone] with questions or concerns regarding your invoice.  ° °Our billing staff will not be able to assist you with questions regarding bills from these companies. ° °You will be contacted with the lab results as soon as they are available. The fastest way to get your results is to activate your My Chart account. Instructions are located on the last page of this paperwork. If you have not heard from us regarding the results in 2 weeks, please contact this office. °  ° ° ° °

## 2019-06-14 NOTE — Progress Notes (Signed)
7/14/20208:54 AM  Stacy Hull 01-31-1971, 48 y.o., female [id number]  Chief Complaint  Patient presents with  . Anxiety    medication is working out well, getting good sleep. Things have turned around in her personal life, doing really well    HPI:   Patient is a 48 y.o. female with past medical history significant for GAD, insomnia, HTN, DM2 who presents today for followup  Last OV June - acute stress reaction, grief, started on prazosin  Sleeping much better, intrusive thoughts and bad dreams minimized Also takes celexa, trazodone and prn xanax, pmp reviewed Has been working with therapist Still gets mild panic attacks Overall she is doing much better, does not want to change medications at this time GAD7 = 7 Does not smoke Has been working on weight loss  Depression screen [facility] 2/9 06/14/2019 05/13/2019 03/22/2019  Decreased Interest 0 1 3  Down, Depressed, Hopeless 0 1 3  PHQ - 2 Score 0 2 6  Altered sleeping - 3 1  Tired, decreased energy - 1 1  Change in appetite - 0 1  Feeling bad or failure about yourself  - 1 3  Trouble concentrating - 2 1  Moving slowly or fidgety/restless - 1 1  Suicidal thoughts - 1 0  PHQ-9 Score - 11 14  Difficult doing work/chores - Somewhat difficult -  Some recent data might be hidden    Fall Risk  06/14/2019 05/13/2019 03/22/2019 02/08/2019 01/31/2019  Falls in the past year? 0 0 0 0 0  Number falls in past yr: 0 0 - - 0  Injury with Fall? 0 0 0 - 0  Follow up - - - Falls evaluation completed -     Allergies  Allergen Reactions  . Erythromycin Other (See Comments)    Reaction:  GI upset     Prior to Admission medications   Medication Sig Start Date End Date Taking? Authorizing Provider  ALPRAZolam (XANAX) 0.5 MG tablet TAKE 1 TABLET(0.5 MG) BY MOUTH DAILY AS NEEDED FOR ANXIETY 05/06/19  Yes Rutherford Guys, MD  citalopram (CELEXA) 40 MG tablet TAKE 1 TABLET(40 MG) BY MOUTH DAILY 11/18/18  Yes Shawnee Knapp, MD  cyclobenzaprine  (FLEXERIL) 10 MG tablet Take 1 tablet (10 mg total) by mouth 3 (three) times daily as needed for muscle spasms. 05/13/19  Yes Rutherford Guys, MD  ibuprofen (ADVIL,MOTRIN) 800 MG tablet Take 1 tablet (800 mg total) by mouth every 8 (eight) hours. With food for 5 days and after than take every 8 hours as needed 09/07/18  Yes Forrest Moron, MD  lisinopril-hydrochlorothiazide (PRINZIDE,ZESTORETIC) 20-25 MG tablet TAKE 1 TABLET BY MOUTH DAILY 11/18/18  Yes Shawnee Knapp, MD  Multiple Vitamin (MULTIVITAMIN WITH MINERALS) TABS tablet Take 1 tablet by mouth daily.   Yes [provider]  prazosin (MINIPRESS) 1 MG capsule Take 1-2 capsules (1-2 mg total) by mouth at bedtime. 05/13/19  Yes Rutherford Guys, MD  traZODone (DESYREL) 50 MG tablet Take 1-2 tablets (50-100 mg total) by mouth at bedtime. 01/16/18  Yes Shawnee Knapp, MD  triamcinolone (NASACORT) 55 MCG/ACT AERO nasal inhaler Place 2 sprays into the nose daily. 11/18/17  Yes Horald Pollen, MD    Past Medical History:  Diagnosis Date  . Allergic rhinitis   . Anxiety   . Asthma    after URI  . Depression   . GERD (gastroesophageal reflux disease)    takes TUMS  . HTN (hypertension)   .  Hyperlipidemia   . Hyperparathyroidism ([place])   . Pneumonia     Past Surgical History:  Procedure Laterality Date  . CERVICAL CONE BIOPSY    . CESAREAN SECTION  2005  . HYSTEROSCOPY WITH NOVASURE  11/26/2012   Procedure: HYSTEROSCOPY WITH NOVASURE;  Surgeon: Marvene Staff, MD;  Location: [facility];  Service: Gynecology;  Laterality: N/A;  . LAPAROSCOPIC LYSIS OF ADHESIONS N/A 11/21/2016   Procedure: EXTENSIVE LAPAROSCOPIC LYSIS OF ADHESIONS;  Surgeon: Servando Salina, MD;  Location: [facility];  Service: Gynecology;  Laterality: N/A;  . LAPAROSCOPY N/A 11/21/2016   Procedure: LAPAROSCOPY DIAGNOSTIC, LAPAROSCOPIC REMOVAL OF FOREIGN BODY;  Surgeon: Ralene Ok, MD;  Location: [facility];  Service: General;  Laterality: N/A;  .  PARATHYROIDECTOMY  1999   benign  . REPAIR VAGINAL CUFF N/A 12/14/2016   Procedure: REPAIR VAGINAL CUFF;  Surgeon: Brien Few, MD;  Location: [facility];  Service: Gynecology;  Laterality: N/A;  . ROBOTIC ASSISTED LAPAROSCOPIC OVARIAN CYSTECTOMY Right 10/01/2015   Procedure: ROBOTIC ASSISTED LAPAROSCOPIC BILATERAL SALPINGECTOMY WITH LYSIS OF ADHESIONS AND RIGHT ADNEXAL CYST;  Surgeon: Princess Bruins, MD;  Location: [facility];  Service: Gynecology;  Laterality: Right;  POSSIBLE robot .  DO NOT DRAPE THE ROBOT.....she will use the laparoscopic camera to start.    . ROBOTIC ASSISTED TOTAL HYSTERECTOMY WITH SALPINGECTOMY N/A 11/21/2016   Procedure: ROBOTIC ASSISTED TOTAL HYSTERECTOMY WITH  EXTENSIVE LYSIS OF ADHESIONS;  Surgeon: Servando Salina, MD;  Location: [facility];  Service: Gynecology;  Laterality: N/A;  . TUBAL LIGATION    . UMBILICAL HERNIA REPAIR    . WISDOM TOOTH EXTRACTION      Social History   Tobacco Use  . Smoking status: Never Smoker  . Smokeless tobacco: Never Used  Substance Use Topics  . Alcohol use: Yes    Alcohol/week: 4.0 standard drinks    Types: 4 Standard drinks or equivalent per week    Comment: occasionally    Family History  Problem Relation Age of Onset  . Depression Mother   . Hypertension Father   . Aneurysm Father 53       cerebral  . Diabetes Maternal Grandmother   . Diabetes Maternal Grandfather   . Stroke Paternal Grandfather 45    Review of Systems  Constitutional: Negative for chills, fever and malaise/fatigue.  Respiratory: Negative for cough and shortness of breath.   Cardiovascular: Negative for chest pain, palpitations and leg swelling.  Gastrointestinal: Negative for abdominal pain, nausea and vomiting.  Neurological: Negative for dizziness.  Psychiatric/Behavioral: The patient is nervous/anxious.      OBJECTIVE:  Today's Vitals   06/14/19 0847  BP: 127/85  Pulse: (!) 112  Temp: 98.7 F (37.1 C)  TempSrc: Oral  SpO2: 96%   Weight: 192 lb (87.1 kg)  Height: 5' 7.09" (1.704 m)   Body mass index is 29.99 kg/m.  Wt Readings from Last 3 Encounters:  06/14/19 192 lb (87.1 kg)  05/13/19 195 lb (88.5 kg)  02/08/19 195 lb 6.4 oz (88.6 kg)    Physical Exam Vitals signs and nursing note reviewed.  Constitutional:      Appearance: She is well-developed.  HENT:     Head: Normocephalic and atraumatic.     Mouth/Throat:     Pharynx: No oropharyngeal exudate.  Eyes:     General: No scleral icterus.    Conjunctiva/sclera: Conjunctivae normal.     Pupils: Pupils are equal, round, and reactive to light.  Neck:     Musculoskeletal: Neck supple.  Cardiovascular:     Rate and Rhythm: Normal rate and regular rhythm.     Heart sounds: Normal heart sounds. No murmur. No friction rub. No gallop.   Pulmonary:     Effort: Pulmonary effort is normal.     Breath sounds: Normal breath sounds. No wheezing or rales.  Musculoskeletal:     Right lower leg: No edema.     Left lower leg: No edema.  Skin:    General: Skin is warm and dry.  Neurological:     Mental Status: She is alert and oriented to person, place, and time.  Psychiatric:        Mood and Affect: Mood is anxious.     ASSESSMENT and PLAN  1. GAD (generalized anxiety disorder) Much improved. Continue current regime and therapy. Consider buspar if augmentation were needed.  - TSH  2. Elevated hemoglobin A1c Cont with LFM - Hemoglobin A1c  3. Essential hypertension Controlled. Continue current regime.  - CMP14+EGFR - Lipid panel  4. Pure hypercholesterolemia Checking labs, cont with LFM  5. Recurrent major depressive disorder, in full remission ([place]) Controlled. Continue current regime.   Other orders - lisinopril-hydrochlorothiazide (ZESTORETIC) 20-25 MG tablet; Take 1 tablet by mouth daily. - citalopram (CELEXA) 40 MG tablet; TAKE 1 TABLET(40 MG) BY MOUTH DAILY - ALPRAZolam (XANAX) 0.5 MG tablet; TAKE 1 TABLET(0.5 MG) BY MOUTH DAILY AS  NEEDED FOR ANXIETY  Return in about 3 months (around 09/14/2019).    Rutherford Guys, MD Primary Care at [place], [place] [address] Ph.  [phone] Fax [phone]

## 2019-06-15 LAB — LIPID PANEL
Chol/HDL Ratio: 3.7 ratio (ref 0.0–4.4)
Cholesterol, Total: 240 mg/dL — ABNORMAL HIGH (ref 100–199)
HDL: 65 mg/dL (ref >39)
LDL Calculated: 115 mg/dL — ABNORMAL HIGH (ref 0–99)
Triglycerides: 299 mg/dL — ABNORMAL HIGH (ref 0–149)
VLDL Cholesterol Cal: 60 mg/dL — ABNORMAL HIGH (ref 5–40)

## 2019-06-15 LAB — HEMOGLOBIN A1C
Est. average glucose Bld gHb Est-mCnc: 140 mg/dL
Hgb A1c MFr Bld: 6.5 % — ABNORMAL HIGH (ref 4.8–5.6)

## 2019-06-15 LAB — CMP14+EGFR
ALT: 13 IU/L (ref 0–32)
AST: 18 IU/L (ref 0–40)
Albumin/Globulin Ratio: 1.6 (ref 1.2–2.2)
Albumin: 4.6 g/dL (ref 3.8–4.8)
Alkaline Phosphatase: 54 IU/L (ref 39–117)
BUN/Creatinine Ratio: 11 (ref 9–23)
BUN: 8 mg/dL (ref 6–24)
Bilirubin Total: 0.4 mg/dL (ref 0.0–1.2)
CO2: 18 mmol/L — ABNORMAL LOW (ref 20–29)
Calcium: 10 mg/dL (ref 8.7–10.2)
Chloride: 96 mmol/L (ref 96–106)
Creatinine, Ser: 0.75 mg/dL (ref 0.57–1.00)
GFR calc Af Amer: 111 mL/min/{1.73_m2} (ref >59)
GFR calc non Af Amer: 97 mL/min/{1.73_m2} (ref >59)
Globulin, Total: 2.8 g/dL (ref 1.5–4.5)
Glucose: 247 mg/dL — ABNORMAL HIGH (ref 65–99)
Potassium: 3.8 mmol/L (ref 3.5–5.2)
Sodium: 135 mmol/L (ref 134–144)
Total Protein: 7.4 g/dL (ref 6.0–8.5)

## 2019-06-15 LAB — TSH: TSH: 1.48 u[IU]/mL (ref 0.450–4.500)

## 2019-06-24 ENCOUNTER — Ambulatory Visit (INDEPENDENT_AMBULATORY_CARE_PROVIDER_SITE_OTHER): Payer: BC Managed Care – PPO | Admitting: Psychology

## 2019-06-24 DIAGNOSIS — F4323 Adjustment disorder with mixed anxiety and depressed mood: Secondary | ICD-10-CM | POA: Diagnosis not present

## 2019-07-13 ENCOUNTER — Ambulatory Visit (INDEPENDENT_AMBULATORY_CARE_PROVIDER_SITE_OTHER): Payer: BC Managed Care – PPO | Admitting: Psychology

## 2019-07-13 DIAGNOSIS — F4323 Adjustment disorder with mixed anxiety and depressed mood: Secondary | ICD-10-CM

## 2019-07-19 ENCOUNTER — Encounter: Payer: Self-pay | Admitting: Family Medicine

## 2019-07-19 ENCOUNTER — Other Ambulatory Visit: Payer: Self-pay

## 2019-07-19 ENCOUNTER — Other Ambulatory Visit: Payer: Self-pay | Admitting: Family Medicine

## 2019-07-19 ENCOUNTER — Ambulatory Visit: Payer: BC Managed Care – PPO | Admitting: Family Medicine

## 2019-07-19 VITALS — BP 120/80 | HR 94 | Temp 98.7°F | Ht 67.02 in | Wt 194.4 lb

## 2019-07-19 DIAGNOSIS — R51 Headache: Secondary | ICD-10-CM | POA: Diagnosis not present

## 2019-07-19 DIAGNOSIS — S0096XA Insect bite (nonvenomous) of unspecified part of head, initial encounter: Secondary | ICD-10-CM | POA: Diagnosis not present

## 2019-07-19 DIAGNOSIS — W57XXXA Bitten or stung by nonvenomous insect and other nonvenomous arthropods, initial encounter: Secondary | ICD-10-CM | POA: Diagnosis not present

## 2019-07-19 DIAGNOSIS — R519 Headache, unspecified: Secondary | ICD-10-CM

## 2019-07-19 MED ORDER — TRAZODONE HCL 50 MG PO TABS: 50.0000 mg | ORAL_TABLET | Freq: Every day | ORAL | 4 refills | Status: DC

## 2019-07-19 MED ORDER — HYDROXYZINE HCL 25 MG PO TABS: 25.0000 mg | ORAL_TABLET | Freq: Three times a day (TID) | ORAL | 0 refills | Status: DC | PRN

## 2019-07-19 MED ORDER — CEPHALEXIN 500 MG PO CAPS: 500.0000 mg | ORAL_CAPSULE | Freq: Three times a day (TID) | ORAL | 0 refills | Status: DC

## 2019-07-19 MED ORDER — ALPRAZOLAM 0.5 MG PO TABS: ORAL_TABLET | ORAL | 0 refills | Status: DC

## 2019-07-19 NOTE — Patient Instructions (Addendum)
   If you have lab work done today you will be contacted with your lab results within the next 2 weeks.  If you have not heard from us then please contact us. The fastest way to get your results is to register for My Chart.   IF you received an x-ray today, you will receive an invoice from [facility]. Please contact [facility] at [phone] with questions or concerns regarding your invoice.   IF you received labwork today, you will receive an invoice from [facility]. Please contact [facility] at [phone] with questions or concerns regarding your invoice.   Our billing staff will not be able to assist you with questions regarding bills from these companies.  You will be contacted with the lab results as soon as they are available. The fastest way to get your results is to activate your My Chart account. Instructions are located on the last page of this paperwork. If you have not heard from us regarding the results in 2 weeks, please contact this office.     Insect Bite, Adult An insect bite can make your skin red, itchy, and swollen. Some insects can spread disease to people with a bite. However, most insect bites do not lead to disease, and most are not serious. What are the causes? Insects may bite for many reasons, including:  Hunger.  To defend themselves. Insects that bite include:  Spiders.  Mosquitoes.  Ticks.  Fleas.  Ants.  Flies.  Kissing bugs.  Chiggers. What are the signs or symptoms? Symptoms of this condition include:  Itching or pain in the bite area.  Redness and swelling in the bite area.  An open wound (skin ulcer). Symptoms often last for 2-4 days. In rare cases, a person may have a very bad allergic reaction (anaphylactic reaction) to a bite. Symptoms of an anaphylactic reaction may include:  Feeling warm in the face (flushed). Your face may turn red.  Itchy, red, swollen areas of skin (hives).  Swelling of  the: ? Eyes. ? Lips. ? Face. ? Mouth. ? Tongue. ? Throat.  Trouble with any of these: ? Breathing. ? Talking. ? Swallowing.  Loud breathing (wheezing).  Feeling dizzy or light-headed.  Passing out (fainting).  Pain or cramps in your belly.  Throwing up (vomiting).  Watery poop (diarrhea). How is this treated? Treatment is usually not needed. Symptoms often go away on their own. When treatment is needed, it may involve:  Putting a cream or lotion on the bite area. This helps with itching.  Taking an antibiotic medicine. This treatment is needed if the bite area gets infected.  Getting a tetanus shot, if you are not up to date on this vaccine.  Putting ice on the affected area.  Using medicines called antihistamines. This treatment may be needed if you have itching or an allergic reaction to the insect bite.  Giving yourself a shot of medicine (epinephrine) using an auto-injector "pen" if you have an anaphylactic reaction to a bite. Your doctor will teach you how to use this pen. Follow these instructions at home: Bite area care   Do not scratch the bite area.  Keep the bite area clean and dry.  Wash the bite area every day with soap and water as told by your doctor.  Check the bite area every day for signs of infection. Check for: ? Redness, swelling, or pain. ? Fluid or blood. ? Warmth. ? Pus or a bad smell. Managing pain, itching, and swelling     You may put any of these on the bite area as told by your doctor: ? A paste made of baking soda and water. ? Cortisone cream. ? Calamine lotion.  If told, put ice on the bite area. ? Put ice in a plastic bag. ? Place a towel between your skin and the bag. ? Leave the ice on for 20 minutes, 2-3 times a day. General instructions  Apply or take over-the-counter and prescription medicines only as told by your doctor.  If you were prescribed an antibiotic medicine, take or apply it as told by your doctor. Do  not stop using the antibiotic even if your condition improves.  Keep all follow-up visits as told by your doctor. This is important. How is this prevented? To help you have a lower risk of insect bites:  When you are outside, wear clothing that covers your arms and legs.  Use insect repellent. The best insect repellents contain one of these: ? DEET. ? Picaridin. ? Oil of lemon eucalyptus (OLE). ? IR3535.  Consider spraying your clothing with a pesticide called permethrin. Permethrin helps prevent insect bites. It works for several weeks and for up to 5-6 clothing washes. Do not apply permethrin directly to the skin.  If your home windows do not have screens, think about putting some in.  If you will be sleeping in an area where there are mosquitoes, consider covering your sleeping area with a mosquito net. Contact a doctor if:  You have redness, swelling, or pain in the bite area.  You have fluid or blood coming from the bite area.  The bite area feels warm to the touch.  You have pus or a bad smell coming from the bite area.  You have a fever. Get help right away if:  You have joint pain.  You have a rash.  You feel more tired or sleepy than you normally do.  You have neck pain.  You have a headache.  You feel weaker than you normally do.  You have signs of an anaphylactic reaction. Signs may include: ? Feeling warm in the face. ? Itchy, red, swollen areas of skin. ? Swelling of your:  Eyes.  Lips.  Face.  Mouth.  Tongue.  Throat. ? Trouble with any of these:  Breathing.  Talking.  Swallowing. ? Loud breathing. ? Feeling dizzy or light-headed. ? Passing out. ? Pain or cramps in your belly. ? Throwing up. ? Watery poop. These symptoms may be an emergency. Do not wait to see if the symptoms will go away. Do this right away:  Use your auto-injector pen as you have been told.  Get medical help. Call your local emergency services (911 in the  [place] Do not drive yourself to the hospital. Summary  An insect bite can make your skin red, itchy, and swollen.  Treatment is usually not needed. Symptoms often go away on their own.  Do not scratch the bite area. Keep it clean and dry.  Ice can help with pain and itching from the bite. This information is not intended to replace advice given to you by your health care provider. Make sure you discuss any questions you have with your health care provider. Document Released: 11/14/2000 Document Revised: 05/28/2018 Document Reviewed: 05/28/2018 Elsevier Patient Education  2020 [employer].

## 2019-07-19 NOTE — Progress Notes (Signed)
8/18/20201:27 PM  Stacy Hull Jul 15, 1971, 48 y.o., female [id number]  Chief Complaint  Patient presents with  . Insect Bite    on forehead with swelling and pain    HPI:   Patient is a 48 y.o. female who presents today for insect bite 2 nights ago  Yesterday morning woke up with what she thinks must have been a spider bite in middle of her forehead This morning was redder, larger, more painful Has not done anything else for it Has h/o of cellulitis from insect bites Needs refills of trazadone and xanax pmp reviewed  Depression screen [facility] 2/9 07/19/2019 06/14/2019 05/13/2019  Decreased Interest 0 0 1  Down, Depressed, Hopeless 0 0 1  PHQ - 2 Score 0 0 2  Altered sleeping - - 3  Tired, decreased energy - - 1  Change in appetite - - 0  Feeling bad or failure about yourself  - - 1  Trouble concentrating - - 2  Moving slowly or fidgety/restless - - 1  Suicidal thoughts - - 1  PHQ-9 Score - - 11  Difficult doing work/chores - - Somewhat difficult  Some recent data might be hidden    Fall Risk  07/19/2019 06/14/2019 05/13/2019 03/22/2019 02/08/2019  Falls in the past year? 0 0 0 0 0  Number falls in past yr: 0 0 0 - -  Injury with Fall? 0 0 0 0 -  Follow up - - - - Falls evaluation completed     Allergies  Allergen Reactions  . Erythromycin Other (See Comments)    Reaction:  GI upset     Prior to Admission medications   Medication Sig Start Date End Date Taking? Authorizing Provider  ALPRAZolam (XANAX) 0.5 MG tablet TAKE 1 TABLET(0.5 MG) BY MOUTH DAILY AS NEEDED FOR ANXIETY 06/14/19  Yes Rutherford Guys, MD  citalopram (CELEXA) 40 MG tablet TAKE 1 TABLET(40 MG) BY MOUTH DAILY 06/14/19  Yes Rutherford Guys, MD  cyclobenzaprine (FLEXERIL) 10 MG tablet Take 1 tablet (10 mg total) by mouth 3 (three) times daily as needed for muscle spasms. 05/13/19  Yes Rutherford Guys, MD  ibuprofen (ADVIL,MOTRIN) 800 MG tablet Take 1 tablet (800 mg total) by mouth every 8 (eight)  hours. With food for 5 days and after than take every 8 hours as needed 09/07/18  Yes Stallings, Zoe A, MD  lisinopril-hydrochlorothiazide (ZESTORETIC) 20-25 MG tablet Take 1 tablet by mouth daily. 06/14/19  Yes Rutherford Guys, MD  Multiple Vitamin (MULTIVITAMIN WITH MINERALS) TABS tablet Take 1 tablet by mouth daily.   Yes [provider]  prazosin (MINIPRESS) 1 MG capsule Take 1-2 capsules (1-2 mg total) by mouth at bedtime. 05/13/19  Yes Rutherford Guys, MD  traZODone (DESYREL) 50 MG tablet Take 1-2 tablets (50-100 mg total) by mouth at bedtime. 01/16/18  Yes Shawnee Knapp, MD  triamcinolone (NASACORT) 55 MCG/ACT AERO nasal inhaler Place 2 sprays into the nose daily. 11/18/17  Yes Horald Pollen, MD    Past Medical History:  Diagnosis Date  . Allergic rhinitis   . Anxiety   . Asthma    after URI  . Depression   . GERD (gastroesophageal reflux disease)    takes TUMS  . HTN (hypertension)   . Hyperlipidemia   . Hyperparathyroidism ([place])   . Pneumonia     Past Surgical History:  Procedure Laterality Date  . CERVICAL CONE BIOPSY    . CESAREAN SECTION  2005  .  HYSTEROSCOPY WITH NOVASURE  11/26/2012   Procedure: HYSTEROSCOPY WITH NOVASURE;  Surgeon: Marvene Staff, MD;  Location: [facility];  Service: Gynecology;  Laterality: N/A;  . LAPAROSCOPIC LYSIS OF ADHESIONS N/A 11/21/2016   Procedure: EXTENSIVE LAPAROSCOPIC LYSIS OF ADHESIONS;  Surgeon: Servando Salina, MD;  Location: [facility];  Service: Gynecology;  Laterality: N/A;  . LAPAROSCOPY N/A 11/21/2016   Procedure: LAPAROSCOPY DIAGNOSTIC, LAPAROSCOPIC REMOVAL OF FOREIGN BODY;  Surgeon: Ralene Ok, MD;  Location: [facility];  Service: General;  Laterality: N/A;  . PARATHYROIDECTOMY  1999   benign  . REPAIR VAGINAL CUFF N/A 12/14/2016   Procedure: REPAIR VAGINAL CUFF;  Surgeon: Brien Few, MD;  Location: [facility];  Service: Gynecology;  Laterality: N/A;  . ROBOTIC ASSISTED LAPAROSCOPIC OVARIAN CYSTECTOMY Right  10/01/2015   Procedure: ROBOTIC ASSISTED LAPAROSCOPIC BILATERAL SALPINGECTOMY WITH LYSIS OF ADHESIONS AND RIGHT ADNEXAL CYST;  Surgeon: Princess Bruins, MD;  Location: [facility];  Service: Gynecology;  Laterality: Right;  POSSIBLE robot .  DO NOT DRAPE THE ROBOT.....she will use the laparoscopic camera to start.    . ROBOTIC ASSISTED TOTAL HYSTERECTOMY WITH SALPINGECTOMY N/A 11/21/2016   Procedure: ROBOTIC ASSISTED TOTAL HYSTERECTOMY WITH  EXTENSIVE LYSIS OF ADHESIONS;  Surgeon: Servando Salina, MD;  Location: [facility];  Service: Gynecology;  Laterality: N/A;  . TUBAL LIGATION    . UMBILICAL HERNIA REPAIR    . WISDOM TOOTH EXTRACTION      Social History   Tobacco Use  . Smoking status: Never Smoker  . Smokeless tobacco: Never Used  Substance Use Topics  . Alcohol use: Yes    Alcohol/week: 4.0 standard drinks    Types: 4 Standard drinks or equivalent per week    Comment: occasionally    Family History  Problem Relation Age of Onset  . Depression Mother   . Hypertension Father   . Aneurysm Father 30       cerebral  . Diabetes Maternal Grandmother   . Diabetes Maternal Grandfather   . Stroke Paternal Grandfather 50    ROS Per hpi  OBJECTIVE:  Today's Vitals   07/19/19 1322  BP: 120/80  Pulse: 94  Temp: 98.7 F (37.1 C)  TempSrc: Oral  SpO2: 100%  Weight: 194 lb 6.4 oz (88.2 kg)  Height: 5' 7.02" (1.702 m)   Body mass index is 30.43 kg/m.   Physical Exam Vitals signs and nursing note reviewed.  Constitutional:      Appearance: She is well-developed.  HENT:     Head: Normocephalic and atraumatic.   Eyes:     General: No scleral icterus.    Conjunctiva/sclera: Conjunctivae normal.     Pupils: Pupils are equal, round, and reactive to light.  Neck:     Musculoskeletal: Neck supple.  Pulmonary:     Effort: Pulmonary effort is normal.  Skin:    General: Skin is warm and dry.  Neurological:     Mental Status: She is alert and oriented to person, place, and  time.     No results found for this or any previous visit (from the past 24 hour(s)).  No results found.   ASSESSMENT and PLAN  1. Forehead pain 2. Insect bite of other part of head, initial encounter Discussed supportive measures, new meds r/se/b and RTC precautions. Patient educational handout given.  Other orders - cephALEXin (KEFLEX) 500 MG capsule; Take 1 capsule (500 mg total) by mouth 3 (three) times daily. - hydrOXYzine (ATARAX/VISTARIL) 25 MG tablet; Take 1 tablet (25 mg total) by  mouth 3 (three) times daily as needed. - traZODone (DESYREL) 50 MG tablet; Take 1-2 tablets (50-100 mg total) by mouth at bedtime. - ALPRAZolam (XANAX) 0.5 MG tablet; TAKE 1 TABLET(0.5 MG) BY MOUTH DAILY AS NEEDED FOR ANXIETY  Return if symptoms worsen or fail to improve.    Rutherford Guys, MD [facility] at [place], [place] [address] Ph.  [phone] Fax [phone]

## 2019-07-28 ENCOUNTER — Ambulatory Visit (INDEPENDENT_AMBULATORY_CARE_PROVIDER_SITE_OTHER): Payer: BC Managed Care – PPO | Admitting: Psychology

## 2019-07-28 DIAGNOSIS — F4323 Adjustment disorder with mixed anxiety and depressed mood: Secondary | ICD-10-CM

## 2019-08-12 ENCOUNTER — Ambulatory Visit (INDEPENDENT_AMBULATORY_CARE_PROVIDER_SITE_OTHER): Payer: BC Managed Care – PPO | Admitting: Psychology

## 2019-08-12 DIAGNOSIS — F4323 Adjustment disorder with mixed anxiety and depressed mood: Secondary | ICD-10-CM

## 2019-08-26 ENCOUNTER — Ambulatory Visit (INDEPENDENT_AMBULATORY_CARE_PROVIDER_SITE_OTHER): Payer: BC Managed Care – PPO | Admitting: Psychology

## 2019-08-26 DIAGNOSIS — F4323 Adjustment disorder with mixed anxiety and depressed mood: Secondary | ICD-10-CM | POA: Diagnosis not present

## 2019-09-06 ENCOUNTER — Other Ambulatory Visit: Payer: Self-pay | Admitting: Family Medicine

## 2019-09-06 NOTE — Telephone Encounter (Signed)
pmp reviewd, appropriate meds refilled

## 2019-09-06 NOTE — Telephone Encounter (Signed)
Requested medication (s) are due for refill today: yes  Requested medication (s) are on the active medication list: yes  Last refill:  07/19/2019  Future visit scheduled: yes  Notes to clinic:  Refill cannot be delegated    Requested Prescriptions  Pending Prescriptions Disp Refills   ALPRAZolam (XANAX) 0.5 MG tablet [Pharmacy Med Name: ALPRAZOLAM 0.5MG  TABLETS] 30 tablet     Sig: TAKE 1 TABLET(0.5 MG) BY MOUTH DAILY AS NEEDED FOR ANXIETY     Not Delegated - Psychiatry:  Anxiolytics/Hypnotics Failed - 09/06/2019  7:34 AM      Failed - This refill cannot be delegated      Failed - Urine Drug Screen completed in last 360 days.      Passed - Valid encounter within last 6 months    Recent Outpatient Visits          1 month ago Forehead pain   [facility], Lilia Argue, MD   2 months ago GAD (generalized anxiety disorder)   [facility], Lilia Argue, MD   3 months ago Acute stress reaction   [facility], Lilia Argue, MD   5 months ago GAD (generalized anxiety disorder)   [facility] [place], MD   7 months ago Acute non-recurrent frontal sinusitis   [facility], Lilia Argue, MD      Future Appointments            In 1 week Rutherford Guys, MD [facility] [place], [facility]

## 2019-09-15 ENCOUNTER — Ambulatory Visit: Payer: BC Managed Care – PPO | Admitting: Family Medicine

## 2019-09-15 ENCOUNTER — Other Ambulatory Visit: Payer: Self-pay

## 2019-09-15 ENCOUNTER — Encounter: Payer: Self-pay | Admitting: Family Medicine

## 2019-09-15 VITALS — BP 136/89 | HR 87 | Temp 98.0°F | Resp 16 | Wt 195.0 lb

## 2019-09-15 DIAGNOSIS — I10 Essential (primary) hypertension: Secondary | ICD-10-CM | POA: Diagnosis not present

## 2019-09-15 DIAGNOSIS — E119 Type 2 diabetes mellitus without complications: Secondary | ICD-10-CM

## 2019-09-15 DIAGNOSIS — E7800 Pure hypercholesterolemia, unspecified: Secondary | ICD-10-CM | POA: Diagnosis not present

## 2019-09-15 DIAGNOSIS — F411 Generalized anxiety disorder: Secondary | ICD-10-CM

## 2019-09-15 MED ORDER — BUSPIRONE HCL 5 MG PO TABS: 5.0000 mg | ORAL_TABLET | Freq: Three times a day (TID) | ORAL | 1 refills | Status: DC

## 2019-09-15 MED ORDER — METFORMIN HCL 500 MG PO TABS: 500.0000 mg | ORAL_TABLET | Freq: Every day | ORAL | 1 refills | Status: DC

## 2019-09-15 NOTE — Progress Notes (Signed)
10/15/20208:41 AM  Stacy Hull 1971/01/03, 48 y.o., female [id number]  Chief Complaint  Patient presents with  . Chronic Conditions    3 month follow-up    HPI:   Patient is a 48 y.o. 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 Today she is doing ok Continues to therapy Sleeping well with medication Cont to teach remotely Has had some panic attacks and episodes of vomiting from stress but overall feels PTSD is much improved  Having thoughts of self-harm but denies any SI, has not engaged in any self-harm  Gyn - Dr Garwin Brothers  Lab Results  Component Value Date   HGBA1C 6.5 (H) 06/14/2019   HGBA1C 6.6 12/05/2017   HGBA1C 6.8 (H) 10/23/2017   Lab Results  Component Value Date   LDLCALC 115 (H) 06/14/2019   CREATININE 0.75 06/14/2019    Depression screen PHQ 2/9 09/15/2019 07/19/2019 06/14/2019  Decreased Interest 1 0 0  Down, Depressed, Hopeless 1 0 0  PHQ - 2 Score 2 0 0  Altered sleeping 0 - -  Tired, decreased energy 1 - -  Change in appetite 0 - -  Feeling bad or failure about yourself  0 - -  Trouble concentrating 0 - -  Moving slowly or fidgety/restless 0 - -  Suicidal thoughts 0 - -  PHQ-9 Score 3 - -  Difficult doing work/chores - - -  Some recent data might be hidden    Fall Risk  09/15/2019 07/19/2019 06/14/2019 05/13/2019 03/22/2019  Falls in the past year? 0 0 0 0 0  Number falls in past yr: 0 0 0 0 -  Injury with Fall? 0 0 0 0 0  Follow up - - - - -     Allergies  Allergen Reactions  . Erythromycin Other (See Comments)    Reaction:  GI upset     Prior to Admission medications   Medication Sig Start Date End Date Taking? Authorizing Provider  ALPRAZolam (XANAX) 0.5 MG tablet TAKE 1 TABLET(0.5 MG) BY MOUTH DAILY AS NEEDED FOR ANXIETY 09/06/19  Yes Rutherford Guys, MD  cephALEXin  (KEFLEX) 500 MG capsule Take 1 capsule (500 mg total) by mouth 3 (three) times daily. 07/19/19  Yes Rutherford Guys, MD  citalopram (CELEXA) 40 MG tablet TAKE 1 TABLET(40 MG) BY MOUTH DAILY 06/14/19  Yes Rutherford Guys, MD  cyclobenzaprine (FLEXERIL) 10 MG tablet Take 1 tablet (10 mg total) by mouth 3 (three) times daily as needed for muscle spasms. 05/13/19  Yes Rutherford Guys, MD  hydrOXYzine (ATARAX/VISTARIL) 25 MG tablet Take 1 tablet (25 mg total) by mouth 3 (three) times daily as needed. 07/19/19  Yes Rutherford Guys, MD  ibuprofen (ADVIL,MOTRIN) 800 MG tablet Take 1 tablet (800 mg total) by mouth every 8 (eight) hours. With food for 5 days and after than take every 8 hours as needed 09/07/18  Yes Stallings, Zoe A, MD  lisinopril-hydrochlorothiazide (ZESTORETIC) 20-25 MG tablet Take 1 tablet by mouth daily. 06/14/19  Yes Rutherford Guys, MD  Multiple Vitamin (MULTIVITAMIN WITH MINERALS) TABS tablet Take 1 tablet by mouth daily.   Yes [provider]  prazosin (MINIPRESS) 1 MG capsule Take 1-2 capsules (1-2 mg total) by mouth at bedtime. 05/13/19  Yes Rutherford Guys, MD  traZODone (DESYREL) 50 MG tablet Take  1-2 tablets (50-100 mg total) by mouth at bedtime. 07/19/19  Yes Rutherford Guys, MD  triamcinolone (NASACORT) 55 MCG/ACT AERO nasal inhaler Place 2 sprays into the nose daily. 11/18/17  Yes Horald Pollen, MD    Past Medical History:  Diagnosis Date  . Allergic rhinitis   . Anxiety   . Asthma    after URI  . Depression   . GERD (gastroesophageal reflux disease)    takes TUMS  . HTN (hypertension)   . Hyperlipidemia   . Hyperparathyroidism ([place])   . Pneumonia     Past Surgical History:  Procedure Laterality Date  . CERVICAL CONE BIOPSY    . CESAREAN SECTION  2005  . HYSTEROSCOPY WITH NOVASURE  11/26/2012   Procedure: HYSTEROSCOPY WITH NOVASURE;  Surgeon: Marvene Staff, MD;  Location: [facility];  Service: Gynecology;  Laterality: N/A;  . LAPAROSCOPIC  LYSIS OF ADHESIONS N/A 11/21/2016   Procedure: EXTENSIVE LAPAROSCOPIC LYSIS OF ADHESIONS;  Surgeon: Servando Salina, MD;  Location: [facility];  Service: Gynecology;  Laterality: N/A;  . LAPAROSCOPY N/A 11/21/2016   Procedure: LAPAROSCOPY DIAGNOSTIC, LAPAROSCOPIC REMOVAL OF FOREIGN BODY;  Surgeon: Ralene Ok, MD;  Location: [facility];  Service: General;  Laterality: N/A;  . PARATHYROIDECTOMY  1999   benign  . REPAIR VAGINAL CUFF N/A 12/14/2016   Procedure: REPAIR VAGINAL CUFF;  Surgeon: Brien Few, MD;  Location: [facility];  Service: Gynecology;  Laterality: N/A;  . ROBOTIC ASSISTED LAPAROSCOPIC OVARIAN CYSTECTOMY Right 10/01/2015   Procedure: ROBOTIC ASSISTED LAPAROSCOPIC BILATERAL SALPINGECTOMY WITH LYSIS OF ADHESIONS AND RIGHT ADNEXAL CYST;  Surgeon: Princess Bruins, MD;  Location: [facility];  Service: Gynecology;  Laterality: Right;  POSSIBLE robot .  DO NOT DRAPE THE ROBOT.....she will use the laparoscopic camera to start.    . ROBOTIC ASSISTED TOTAL HYSTERECTOMY WITH SALPINGECTOMY N/A 11/21/2016   Procedure: ROBOTIC ASSISTED TOTAL HYSTERECTOMY WITH  EXTENSIVE LYSIS OF ADHESIONS;  Surgeon: Servando Salina, MD;  Location: [facility];  Service: Gynecology;  Laterality: N/A;  . TUBAL LIGATION    . UMBILICAL HERNIA REPAIR    . WISDOM TOOTH EXTRACTION      Social History   Tobacco Use  . Smoking status: Never Smoker  . Smokeless tobacco: Never Used  Substance Use Topics  . Alcohol use: Yes    Alcohol/week: 4.0 standard drinks    Types: 4 Standard drinks or equivalent per week    Comment: occasionally    Family History  Problem Relation Age of Onset  . Depression Mother   . Hypertension Father   . Aneurysm Father 16       cerebral  . Diabetes Maternal Grandmother   . Diabetes Maternal Grandfather   . Stroke Paternal Grandfather 69    Review of Systems  Constitutional: Negative for chills and fever.  Respiratory: Negative for cough and shortness of breath.   Cardiovascular:  Negative for chest pain, palpitations and leg swelling.  Gastrointestinal: Negative for abdominal pain, nausea and vomiting.     OBJECTIVE:  Today's Vitals   09/15/19 0826  BP: 136/89  Pulse: 87  Resp: 16  Temp: 98 F (36.7 C)  TempSrc: Oral  SpO2: 97%  Weight: 195 lb (88.5 kg)   Body mass index is 30.52 kg/m.  Wt Readings from Last 3 Encounters:  09/15/19 195 lb (88.5 kg)  07/19/19 194 lb 6.4 oz (88.2 kg)  06/14/19 192 lb (87.1 kg)    Physical Exam Vitals signs and nursing note reviewed.  Constitutional:  Appearance: She is well-developed.  HENT:     Head: Normocephalic and atraumatic.     Mouth/Throat:     Pharynx: No oropharyngeal exudate.  Eyes:     General: No scleral icterus.    Conjunctiva/sclera: Conjunctivae normal.     Pupils: Pupils are equal, round, and reactive to light.  Neck:     Musculoskeletal: Neck supple.  Cardiovascular:     Rate and Rhythm: Normal rate and regular rhythm.     Heart sounds: Normal heart sounds. No murmur. No friction rub. No gallop.   Pulmonary:     Effort: Pulmonary effort is normal.     Breath sounds: Normal breath sounds. No wheezing or rales.  Skin:    General: Skin is warm and dry.  Neurological:     Mental Status: She is alert and oriented to person, place, and time.     No results found for this or any previous visit (from the past 24 hour(s)).  No results found.   ASSESSMENT and PLAN  1. GAD (generalized anxiety disorder) Not controlled. Adding buspar. Reviewed r/se/b. Titrate to effect. Cont with counseling. Denies active SI - no plan or intent - strict ER precautions given.  2. Controlled type 2 diabetes mellitus without complication, without long-term current use of insulin ([place]) Patient struggling with LFM, adding metformin low dose, reviewed r/se/b  3. Essential hypertension Controlled. Continue current regime.  - CMP14+EGFR - Lipid panel  4. Pure hypercholesterolemia Labs pending. Consider  adding statin given DM2.   - CMP14+EGFR - Lipid panel  Other orders - busPIRone (BUSPAR) 5 MG tablet; Take 1 tablet (5 mg total) by mouth 3 (three) times daily. - metFORMIN (GLUCOPHAGE) 500 MG tablet; Take 1 tablet (500 mg total) by mouth daily with breakfast.  Return in about 4 weeks (around 10/13/2019).    Rutherford Guys, MD [facility] at [place],  [address] Ph.  [phone] Fax [phone]

## 2019-09-15 NOTE — Patient Instructions (Signed)
° ° ° °  If you have lab work done today you will be contacted with your lab results within the next 2 weeks.  If you have not heard from us then please contact us. The fastest way to get your results is to register for My Chart. ° ° °IF you received an x-ray today, you will receive an invoice from [facility]. Please contact [facility] at [phone] with questions or concerns regarding your invoice.  ° °IF you received labwork today, you will receive an invoice from [facility]. Please contact [facility] at [phone] with questions or concerns regarding your invoice.  ° °Our billing staff will not be able to assist you with questions regarding bills from these companies. ° °You will be contacted with the lab results as soon as they are available. The fastest way to get your results is to activate your My Chart account. Instructions are located on the last page of this paperwork. If you have not heard from us regarding the results in 2 weeks, please contact this office. °  ° ° ° °

## 2019-09-16 ENCOUNTER — Ambulatory Visit (INDEPENDENT_AMBULATORY_CARE_PROVIDER_SITE_OTHER): Payer: BC Managed Care – PPO | Admitting: Psychology

## 2019-09-16 DIAGNOSIS — F4323 Adjustment disorder with mixed anxiety and depressed mood: Secondary | ICD-10-CM | POA: Diagnosis not present

## 2019-09-16 LAB — CMP14+EGFR
ALT: 17 IU/L (ref 0–32)
AST: 20 IU/L (ref 0–40)
Albumin/Globulin Ratio: 1.8 (ref 1.2–2.2)
Albumin: 4.8 g/dL (ref 3.8–4.8)
Alkaline Phosphatase: 57 IU/L (ref 39–117)
BUN/Creatinine Ratio: 16 (ref 9–23)
BUN: 9 mg/dL (ref 6–24)
Bilirubin Total: 0.5 mg/dL (ref 0.0–1.2)
CO2: 20 mmol/L (ref 20–29)
Calcium: 9.9 mg/dL (ref 8.7–10.2)
Chloride: 99 mmol/L (ref 96–106)
Creatinine, Ser: 0.58 mg/dL (ref 0.57–1.00)
GFR calc Af Amer: 128 mL/min/{1.73_m2} (ref >59)
GFR calc non Af Amer: 111 mL/min/{1.73_m2} (ref >59)
Globulin, Total: 2.7 g/dL (ref 1.5–4.5)
Glucose: 152 mg/dL — ABNORMAL HIGH (ref 65–99)
Potassium: 4 mmol/L (ref 3.5–5.2)
Sodium: 136 mmol/L (ref 134–144)
Total Protein: 7.5 g/dL (ref 6.0–8.5)

## 2019-09-16 LAB — LIPID PANEL
Chol/HDL Ratio: 3.6 ratio (ref 0.0–4.4)
Cholesterol, Total: 237 mg/dL — ABNORMAL HIGH (ref 100–199)
HDL: 66 mg/dL (ref >39)
LDL Chol Calc (NIH): 141 mg/dL — ABNORMAL HIGH (ref 0–99)
Triglycerides: 171 mg/dL — ABNORMAL HIGH (ref 0–149)
VLDL Cholesterol Cal: 30 mg/dL (ref 5–40)

## 2019-10-04 ENCOUNTER — Ambulatory Visit (INDEPENDENT_AMBULATORY_CARE_PROVIDER_SITE_OTHER): Payer: BC Managed Care – PPO | Admitting: Psychology

## 2019-10-04 DIAGNOSIS — F4323 Adjustment disorder with mixed anxiety and depressed mood: Secondary | ICD-10-CM

## 2019-10-10 ENCOUNTER — Other Ambulatory Visit: Payer: Self-pay

## 2019-10-10 ENCOUNTER — Emergency Department (HOSPITAL_BASED_OUTPATIENT_CLINIC_OR_DEPARTMENT_OTHER): Payer: BC Managed Care – PPO

## 2019-10-10 ENCOUNTER — Emergency Department (HOSPITAL_BASED_OUTPATIENT_CLINIC_OR_DEPARTMENT_OTHER)
Admission: EM | Admit: 2019-10-10 | Discharge: 2019-10-10 | Disposition: A | Discharge status: Home / Self Care | Payer: BC Managed Care – PPO | Attending: Emergency Medicine | Admitting: Emergency Medicine

## 2019-10-10 DIAGNOSIS — J209 Acute bronchitis, unspecified: Secondary | ICD-10-CM | POA: Insufficient documentation

## 2019-10-10 DIAGNOSIS — Z20828 Contact with and (suspected) exposure to other viral communicable diseases: Secondary | ICD-10-CM

## 2019-10-10 DIAGNOSIS — Z79899 Other long term (current) drug therapy: Secondary | ICD-10-CM | POA: Diagnosis not present

## 2019-10-10 DIAGNOSIS — I10 Essential (primary) hypertension: Secondary | ICD-10-CM | POA: Insufficient documentation

## 2019-10-10 DIAGNOSIS — R509 Fever, unspecified: Secondary | ICD-10-CM | POA: Diagnosis present

## 2019-10-10 DIAGNOSIS — J45909 Unspecified asthma, uncomplicated: Secondary | ICD-10-CM | POA: Insufficient documentation

## 2019-10-10 DIAGNOSIS — Z20822 Contact with and (suspected) exposure to covid-19: Secondary | ICD-10-CM

## 2019-10-10 LAB — SARS CORONAVIRUS 2 (TAT 6-24 HRS): SARS Coronavirus 2: NEGATIVE

## 2019-10-10 MED ORDER — PREDNISONE 20 MG PO TABS: 60.0000 mg | ORAL_TABLET | Freq: Every day | ORAL | 0 refills | Status: DC

## 2019-10-10 MED ORDER — AZITHROMYCIN 250 MG PO TABS: 250.0000 mg | ORAL_TABLET | Freq: Every day | ORAL | 0 refills | Status: DC

## 2019-10-10 MED ORDER — ALBUTEROL SULFATE HFA 108 (90 BASE) MCG/ACT IN AERS
6.0000 | INHALATION_SPRAY | Freq: Once | RESPIRATORY_TRACT | Status: AC
  Administered 2019-10-10: 6 via RESPIRATORY_TRACT
  Filled 2019-10-10: qty 6.7

## 2019-10-10 MED FILL — AZITHROMYCIN 250 MG TABLET: 250 | 5 days supply | Qty: 6 | Fill #0

## 2019-10-10 MED FILL — predniSONE 20 MG TABS: 20 | 4 days supply | Qty: 12 | Fill #0

## 2019-10-10 NOTE — ED Provider Notes (Signed)
[facility]GENC[facility] DEPARTMENT Provider Note   CSN: [id number] Arrival date & time: 10/10/19  6294     History   Chief Complaint Chief Complaint  Patient presents with  . Fever  . Chills    HPI Stacy Hull is a 48 y.o. female.  She has a history of asthma and is a non-smoker.  She says her daughter started with Covid symptoms about 2 weeks ago although a test is negative.  The patient herself started with symptoms on Thursday with head congestion headache cough productive of yellow sputum sore throat fevers and chills loss of taste and smell.  She used her inhaler with some relief yesterday but symptoms recurred again today.  She is worried it might be a sinus infection or pneumonia because she has had issues with that in the past.     The history is provided by the patient.  URI Presenting symptoms: congestion, cough, fatigue, fever, rhinorrhea and sore throat   Cough:    Cough characteristics:  Productive   Sputum characteristics:  Yellow   Severity:  Moderate   Onset quality:  Gradual   Duration:  5 days   Timing:  Intermittent   Progression:  Unchanged   Chronicity:  New Relieved by:  Nothing Worsened by:  Nothing Ineffective treatments:  Nebulizer treatments Associated symptoms: headaches, myalgias, sinus pain and wheezing   Risk factors: sick contacts   Risk factors: no recent travel     Past Medical History:  Diagnosis Date  . Allergic rhinitis   . Anxiety   . Asthma    after URI  . Depression   . GERD (gastroesophageal reflux disease)    takes TUMS  . HTN (hypertension)   . Hyperlipidemia   . Hyperparathyroidism ([place])   . Pneumonia     Patient Active Problem List   Diagnosis Date Noted  . GAD (generalized anxiety disorder) 03/22/2019  . Sinus congestion 11/18/2017  . Acute non-recurrent maxillary sinusitis 11/18/2017  . S/P hysterectomy 11/21/2016  . Vitamin D deficiency 05/12/2016  . Anxiety state 09/27/2007  . Hyperlipidemia  03/18/2007  . Depression 03/18/2007  . Essential hypertension 03/18/2007    Past Surgical History:  Procedure Laterality Date  . CERVICAL CONE BIOPSY    . CESAREAN SECTION  2005  . HYSTEROSCOPY WITH NOVASURE  11/26/2012   Procedure: HYSTEROSCOPY WITH NOVASURE;  Surgeon: Marvene Staff, MD;  Location: [facility];  Service: Gynecology;  Laterality: N/A;  . LAPAROSCOPIC LYSIS OF ADHESIONS N/A 11/21/2016   Procedure: EXTENSIVE LAPAROSCOPIC LYSIS OF ADHESIONS;  Surgeon: Servando Salina, MD;  Location: [facility];  Service: Gynecology;  Laterality: N/A;  . LAPAROSCOPY N/A 11/21/2016   Procedure: LAPAROSCOPY DIAGNOSTIC, LAPAROSCOPIC REMOVAL OF FOREIGN BODY;  Surgeon: Ralene Ok, MD;  Location: [facility];  Service: General;  Laterality: N/A;  . PARATHYROIDECTOMY  1999   benign  . REPAIR VAGINAL CUFF N/A 12/14/2016   Procedure: REPAIR VAGINAL CUFF;  Surgeon: Brien Few, MD;  Location: [facility];  Service: Gynecology;  Laterality: N/A;  . ROBOTIC ASSISTED LAPAROSCOPIC OVARIAN CYSTECTOMY Right 10/01/2015   Procedure: ROBOTIC ASSISTED LAPAROSCOPIC BILATERAL SALPINGECTOMY WITH LYSIS OF ADHESIONS AND RIGHT ADNEXAL CYST;  Surgeon: Princess Bruins, MD;  Location: [facility];  Service: Gynecology;  Laterality: Right;  POSSIBLE robot .  DO NOT DRAPE THE ROBOT.....she will use the laparoscopic camera to start.    . ROBOTIC ASSISTED TOTAL HYSTERECTOMY WITH SALPINGECTOMY N/A 11/21/2016   Procedure: ROBOTIC ASSISTED TOTAL HYSTERECTOMY WITH  EXTENSIVE LYSIS OF  ADHESIONS;  Surgeon: Servando Salina, MD;  Location: [facility];  Service: Gynecology;  Laterality: N/A;  . TUBAL LIGATION    . UMBILICAL HERNIA REPAIR    . WISDOM TOOTH EXTRACTION       OB History    Gravida  2   Para  2   Term  2   Preterm      AB      Living  2     SAB      TAB      Ectopic      Multiple      Live Births  2            Home Medications    Prior to Admission medications   Medication Sig Start Date End  Date Taking? Authorizing Provider  ALPRAZolam (XANAX) 0.5 MG tablet TAKE 1 TABLET(0.5 MG) BY MOUTH DAILY AS NEEDED FOR ANXIETY 09/06/19   Rutherford Guys, MD  busPIRone (BUSPAR) 5 MG tablet Take 1 tablet (5 mg total) by mouth 3 (three) times daily. 09/15/19   Rutherford Guys, MD  cephALEXin (KEFLEX) 500 MG capsule Take 1 capsule (500 mg total) by mouth 3 (three) times daily. 07/19/19   Rutherford Guys, MD  citalopram (CELEXA) 40 MG tablet TAKE 1 TABLET(40 MG) BY MOUTH DAILY 06/14/19   Rutherford Guys, MD  cyclobenzaprine (FLEXERIL) 10 MG tablet Take 1 tablet (10 mg total) by mouth 3 (three) times daily as needed for muscle spasms. 05/13/19   Rutherford Guys, MD  hydrOXYzine (ATARAX/VISTARIL) 25 MG tablet Take 1 tablet (25 mg total) by mouth 3 (three) times daily as needed. 07/19/19   Rutherford Guys, MD  ibuprofen (ADVIL,MOTRIN) 800 MG tablet Take 1 tablet (800 mg total) by mouth every 8 (eight) hours. With food for 5 days and after than take every 8 hours as needed 09/07/18   Delia Chimes A, MD  lisinopril-hydrochlorothiazide (ZESTORETIC) 20-25 MG tablet Take 1 tablet by mouth daily. 06/14/19   Rutherford Guys, MD  metFORMIN (GLUCOPHAGE) 500 MG tablet Take 1 tablet (500 mg total) by mouth daily with breakfast. 09/15/19   Rutherford Guys, MD  Multiple Vitamin (MULTIVITAMIN WITH MINERALS) TABS tablet Take 1 tablet by mouth daily.    [provider]  prazosin (MINIPRESS) 1 MG capsule Take 1-2 capsules (1-2 mg total) by mouth at bedtime. 05/13/19   Rutherford Guys, MD  traZODone (DESYREL) 50 MG tablet Take 1-2 tablets (50-100 mg total) by mouth at bedtime. 07/19/19   Rutherford Guys, MD  triamcinolone (NASACORT) 55 MCG/ACT AERO nasal inhaler Place 2 sprays into the nose daily. 11/18/17   Horald Pollen, MD    Family History Family History  Problem Relation Age of Onset  . Depression Mother   . Hypertension Father   . Aneurysm Father 43       cerebral  . Diabetes Maternal  Grandmother   . Diabetes Maternal Grandfather   . Stroke Paternal Grandfather 20    Social History Social History   Tobacco Use  . Smoking status: Never Smoker  . Smokeless tobacco: Never Used  Substance Use Topics  . Alcohol use: Yes    Alcohol/week: 4.0 standard drinks    Types: 4 Standard drinks or equivalent per week    Comment: occasionally  . Drug use: No     Allergies   Erythromycin   Review of Systems Review of Systems  Constitutional: Positive for fatigue and fever.  HENT: Positive  for congestion, rhinorrhea, sinus pain and sore throat.   Eyes: Negative for visual disturbance.  Respiratory: Positive for cough and wheezing. Negative for shortness of breath.   Cardiovascular: Negative for chest pain.  Gastrointestinal: Negative for abdominal pain.  Genitourinary: Negative for dysuria.  Musculoskeletal: Positive for myalgias.  Skin: Negative for rash.  Neurological: Positive for headaches.     Physical Exam Updated Vital Signs BP (!) 143/97 (BP Location: Right Arm)   Pulse 82   Temp 98.6 F (37 C) (Oral)   Resp 18   Ht 5' 7"  (1.702 m)   Wt 88.5 kg   LMP 11/03/2016 (Exact Date)   SpO2 98%   BMI 30.54 kg/m   Physical Exam Vitals signs and nursing note reviewed.  Constitutional:      General: She is not in acute distress.    Appearance: She is well-developed.  HENT:     Head: Normocephalic and atraumatic.     Mouth/Throat:     Mouth: Mucous membranes are moist.     Pharynx: Oropharynx is clear.  Eyes:     Conjunctiva/sclera: Conjunctivae normal.  Neck:     Musculoskeletal: Neck supple.  Cardiovascular:     Rate and Rhythm: Regular rhythm. Tachycardia present.     Heart sounds: No murmur.  Pulmonary:     Effort: Pulmonary effort is normal. No respiratory distress.     Breath sounds: Normal breath sounds.  Abdominal:     Palpations: Abdomen is soft.     Tenderness: There is no abdominal tenderness.  Musculoskeletal: Normal range of motion.      Right lower leg: No edema.     Left lower leg: No edema.  Skin:    General: Skin is warm and dry.     Capillary Refill: Capillary refill takes less than 2 seconds.  Neurological:     General: No focal deficit present.     Mental Status: She is alert.      ED Treatments / Results  Labs (all labs ordered are listed, but only abnormal results are displayed) Labs Reviewed  SARS CORONAVIRUS 2 (TAT 6-24 HRS)    EKG None  Radiology Dg Chest Port 1 View  Result Date: 10/10/2019 CLINICAL DATA:  Dyspnea, cough EXAM: PORTABLE CHEST 1 VIEW COMPARISON:  02/08/2019 chest radiograph. FINDINGS: Surgical clips throughout the medial lower left neck. Stable cardiomediastinal silhouette with normal heart size. No pneumothorax. No pleural effusion. Lungs appear clear, with no acute consolidative airspace disease and no pulmonary edema. IMPRESSION: No active disease. Electronically Signed   By: Ilona Sorrel M.D.   On: 10/10/2019 08:47    Procedures Procedures (including critical care time)  Medications Ordered in ED Medications  albuterol (VENTOLIN HFA) 108 (90 Base) MCG/ACT inhaler 6 puff (6 puffs Inhalation Given 10/10/19 0756)     Initial Impression / Assessment and Plan / ED Course  I have reviewed the triage vital signs and the nursing notes.  Pertinent labs & imaging results that were available during my care of the patient were reviewed by me and considered in my medical decision making (see chart for details).  Clinical Course as of Oct 09 914  Mon Oct 09, 1225  494 48 year old female here with cough congestion.  Differential includes Covid, pneumonia, sinusitis, viral illness.  Pulse ox 98 to 99%.  She looks tired appearing but nontoxic.  Checking a chest x-ray.  Likely cover with steroids and antibiotics although will send a Covid test off.  She understands to isolate  until results of her testing are back.   [MB]  [id number] Chest x-ray interpreted by me as no acute infiltrate.  Will  cover with antibiotics and steroids for possible sinusitis versus bronchitis exacerbation.   [MB]    Clinical Course User Index [MB] Hayden Rasmussen, MD   Glori Luis was evaluated in Emergency Department on 10/10/2019 for the symptoms described in the history of present illness. She was evaluated in the context of the global COVID-19 pandemic, which necessitated consideration that the patient might be at risk for infection with the SARS-CoV-2 virus that causes COVID-19. Institutional protocols and algorithms that pertain to the evaluation of patients at risk for COVID-19 are in a state of rapid change based on information released by regulatory bodies including the CDC and federal and state organizations. These policies and algorithms were followed during the patient's care in the ED.     Final Clinical Impressions(s) / ED Diagnoses   Final diagnoses:  Person under investigation for COVID-19  Acute bronchitis, unspecified organism    ED Discharge Orders         Ordered    azithromycin (ZITHROMAX) 250 MG tablet  Daily     10/10/19 0901    predniSONE (DELTASONE) 20 MG tablet  Daily     10/10/19 0901           Hayden Rasmussen, MD 10/10/19 [phone]

## 2019-10-10 NOTE — ED Triage Notes (Signed)
Pt reports nasal congestion, cough, sob, sore throat, subjective temps, intermittent loss of taste and smell x Thursday. Her daughter had similar milder sx, tested neg for covid.

## 2019-10-10 NOTE — Discharge Instructions (Signed)
You were seen in the emergency department for 5 days of fevers chills cough shortness of breath.  This is possibly Covid and you had a test here that will result in the next 1 to 3 days.  You should isolate until the results of your testing are back.  You also received some breathing treatments here and we are to put you on some antibiotics and steroids for a possible bronchitis.  Please drink plenty of fluids and use Tylenol as needed for pain.  Follow-up with your doctor return if any worsening symptoms.

## 2019-10-13 ENCOUNTER — Ambulatory Visit: Payer: BC Managed Care – PPO | Admitting: Family Medicine

## 2019-10-13 ENCOUNTER — Other Ambulatory Visit: Payer: Self-pay

## 2019-10-13 ENCOUNTER — Encounter: Payer: Self-pay | Admitting: Family Medicine

## 2019-10-13 VITALS — BP 124/80 | HR 83 | Temp 99.1°F | Ht 67.0 in | Wt 193.8 lb

## 2019-10-13 DIAGNOSIS — R0981 Nasal congestion: Secondary | ICD-10-CM

## 2019-10-13 DIAGNOSIS — E119 Type 2 diabetes mellitus without complications: Secondary | ICD-10-CM | POA: Diagnosis not present

## 2019-10-13 DIAGNOSIS — F411 Generalized anxiety disorder: Secondary | ICD-10-CM

## 2019-10-13 DIAGNOSIS — I10 Essential (primary) hypertension: Secondary | ICD-10-CM

## 2019-10-13 DIAGNOSIS — J209 Acute bronchitis, unspecified: Secondary | ICD-10-CM

## 2019-10-13 MED ORDER — BUSPIRONE HCL 5 MG PO TABS: 5.0000 mg | ORAL_TABLET | Freq: Every day | ORAL | 1 refills | Status: DC

## 2019-10-13 MED ORDER — BENZONATATE 100 MG PO CAPS: 100.0000 mg | ORAL_CAPSULE | Freq: Three times a day (TID) | ORAL | 1 refills | Status: DC | PRN

## 2019-10-13 MED ORDER — IBUPROFEN 800 MG PO TABS: 800.0000 mg | ORAL_TABLET | Freq: Three times a day (TID) | ORAL | 2 refills | Status: DC

## 2019-10-13 MED ORDER — TRIAMCINOLONE ACETONIDE 55 MCG/ACT NA AERO: 2.0000 | INHALATION_SPRAY | Freq: Every day | NASAL | 12 refills | Status: DC

## 2019-10-13 NOTE — Progress Notes (Signed)
11/12/20209:09 AM  Stacy Hull 1971-04-17, 48 y.o., female [id number]  Chief Complaint  Patient presents with  . Follow-up    anxiety, and medication increase. Says the buspar has helped tremendously. Currently being tx for bronchitis, neg covid 3 days ago    HPI:   Patient is a 48 y.o. female with past medical history significant for GAD, insomnia, HTN, DM2who presents today for followup  Last OV oct 2020 Added buspar 80m TID - taking once a day, very happy, states anxiety sign improved, rarely will take BID Added metformin 5018mdaily - tolerating well Seen in Ed 3 days ago - dx w bronchitis, given pred and azithromycin, neg cxr and covid test, doing much better, breathing back to normal, cough still an issue, cant sleep well due to it Had flu vaccine this season Otherwise she is doing well and has no acute concerns  GAD 7 : Generalized Anxiety Score 10/13/2019 06/14/2019 05/13/2019 09/14/2018  Nervous, Anxious, on Edge 1 1 3 3   Control/stop worrying 1 1 2 2   Worry too much - different things 1 1 2 2   Trouble relaxing 1 1 3 3   Restless 0 0 3 1  Easily annoyed or irritable 1 2 3 3   Afraid - awful might happen 1 1 3 1   Total GAD 7 Score 6 7 19 15   Anxiety Difficulty Somewhat difficult - Somewhat difficult Somewhat difficult     [facility] 10/13/2019 09/15/2019 07/19/2019  Decreased Interest 0 1 0  Down, Depressed, Hopeless 0 1 0  PHQ - 2 Score 0 2 0  Altered sleeping - 0 -  Tired, decreased energy - 1 -  Change in appetite - 0 -  Feeling bad or failure about yourself  - 0 -  Trouble concentrating - 0 -  Moving slowly or fidgety/restless - 0 -  Suicidal thoughts - 0 -  PHQ-9 Score - 3 -  Difficult doing work/chores - - -  Some recent data might be hidden    Fall Risk  10/13/2019 09/15/2019 07/19/2019 06/14/2019 05/13/2019  Falls in the past year? 0 0 0 0 0  Number falls in past yr: 0 0 0 0 0  Injury with Fall? 0 0 0 0 0  Follow up - - - - -     Allergies  Allergen Reactions  . Erythromycin Other (See Comments)    Reaction:  GI upset     Prior to Admission medications   Medication Sig Start Date End Date Taking? Authorizing Provider  ALPRAZolam (XANAX) 0.5 MG tablet TAKE 1 TABLET(0.5 MG) BY MOUTH DAILY AS NEEDED FOR ANXIETY 09/06/19  Yes SaRutherford GuysMD  azithromycin (ZITHROMAX) 250 MG tablet Take 1 tablet (250 mg total) by mouth daily. Take first 2 tablets together, then 1 every day until finished. 10/10/19  Yes BuHayden RasmussenMD  busPIRone (BUSPAR) 5 MG tablet Take 1 tablet (5 mg total) by mouth 3 (three) times daily. 09/15/19  Yes SaRutherford GuysMD  cephALEXin (KEFLEX) 500 MG capsule Take 1 capsule (500 mg total) by mouth 3 (three) times daily. 07/19/19  Yes SaRutherford GuysMD  citalopram (CELEXA) 40 MG tablet TAKE 1 TABLET(40 MG) BY MOUTH DAILY 06/14/19  Yes SaRutherford GuysMD  cyclobenzaprine (FLEXERIL) 10 MG tablet Take 1 tablet (10 mg total) by mouth 3 (three) times daily as needed for muscle spasms. 05/13/19  Yes SaRutherford GuysMD  hydrOXYzine (ATARAX/VISTARIL) 25 MG tablet Take 1  tablet (25 mg total) by mouth 3 (three) times daily as needed. 07/19/19  Yes Rutherford Guys, MD  ibuprofen (ADVIL,MOTRIN) 800 MG tablet Take 1 tablet (800 mg total) by mouth every 8 (eight) hours. With food for 5 days and after than take every 8 hours as needed 09/07/18  Yes Stallings, Zoe A, MD  lisinopril-hydrochlorothiazide (ZESTORETIC) 20-25 MG tablet Take 1 tablet by mouth daily. 06/14/19  Yes Rutherford Guys, MD  metFORMIN (GLUCOPHAGE) 500 MG tablet Take 1 tablet (500 mg total) by mouth daily with breakfast. 09/15/19  Yes Rutherford Guys, MD  Multiple Vitamin (MULTIVITAMIN WITH MINERALS) TABS tablet Take 1 tablet by mouth daily.   Yes [provider]  prazosin (MINIPRESS) 1 MG capsule Take 1-2 capsules (1-2 mg total) by mouth at bedtime. 05/13/19  Yes Rutherford Guys, MD  predniSONE (DELTASONE) 20 MG tablet Take 3  tablets (60 mg total) by mouth daily. 10/10/19  Yes Hayden Rasmussen, MD  traZODone (DESYREL) 50 MG tablet Take 1-2 tablets (50-100 mg total) by mouth at bedtime. 07/19/19  Yes Rutherford Guys, MD  triamcinolone (NASACORT) 55 MCG/ACT AERO nasal inhaler Place 2 sprays into the nose daily. 11/18/17  Yes Horald Pollen, MD    Past Medical History:  Diagnosis Date  . Allergic rhinitis   . Anxiety   . Asthma    after URI  . Depression   . GERD (gastroesophageal reflux disease)    takes TUMS  . HTN (hypertension)   . Hyperlipidemia   . Hyperparathyroidism ([place])   . Pneumonia     Past Surgical History:  Procedure Laterality Date  . CERVICAL CONE BIOPSY    . CESAREAN SECTION  2005  . HYSTEROSCOPY WITH NOVASURE  11/26/2012   Procedure: HYSTEROSCOPY WITH NOVASURE;  Surgeon: Marvene Staff, MD;  Location: [facility];  Service: Gynecology;  Laterality: N/A;  . LAPAROSCOPIC LYSIS OF ADHESIONS N/A 11/21/2016   Procedure: EXTENSIVE LAPAROSCOPIC LYSIS OF ADHESIONS;  Surgeon: Servando Salina, MD;  Location: [facility];  Service: Gynecology;  Laterality: N/A;  . LAPAROSCOPY N/A 11/21/2016   Procedure: LAPAROSCOPY DIAGNOSTIC, LAPAROSCOPIC REMOVAL OF FOREIGN BODY;  Surgeon: Ralene Ok, MD;  Location: [facility];  Service: General;  Laterality: N/A;  . PARATHYROIDECTOMY  1999   benign  . REPAIR VAGINAL CUFF N/A 12/14/2016   Procedure: REPAIR VAGINAL CUFF;  Surgeon: Brien Few, MD;  Location: [facility];  Service: Gynecology;  Laterality: N/A;  . ROBOTIC ASSISTED LAPAROSCOPIC OVARIAN CYSTECTOMY Right 10/01/2015   Procedure: ROBOTIC ASSISTED LAPAROSCOPIC BILATERAL SALPINGECTOMY WITH LYSIS OF ADHESIONS AND RIGHT ADNEXAL CYST;  Surgeon: Princess Bruins, MD;  Location: [facility];  Service: Gynecology;  Laterality: Right;  POSSIBLE robot .  DO NOT DRAPE THE ROBOT.....she will use the laparoscopic camera to start.    . ROBOTIC ASSISTED TOTAL HYSTERECTOMY WITH SALPINGECTOMY N/A 11/21/2016   Procedure:  ROBOTIC ASSISTED TOTAL HYSTERECTOMY WITH  EXTENSIVE LYSIS OF ADHESIONS;  Surgeon: Servando Salina, MD;  Location: [facility];  Service: Gynecology;  Laterality: N/A;  . TUBAL LIGATION    . UMBILICAL HERNIA REPAIR    . WISDOM TOOTH EXTRACTION      Social History   Tobacco Use  . Smoking status: Never Smoker  . Smokeless tobacco: Never Used  Substance Use Topics  . Alcohol use: Yes    Alcohol/week: 4.0 standard drinks    Types: 4 Standard drinks or equivalent per week    Comment: occasionally    Family History  Problem Relation Age  of Onset  . Depression Mother   . Hypertension Father   . Aneurysm Father 31       cerebral  . Diabetes Maternal Grandmother   . Diabetes Maternal Grandfather   . Stroke Paternal Grandfather 12    Review of Systems  Constitutional: Negative for chills and fever.  Respiratory: Positive for cough. Negative for shortness of breath and wheezing.   Cardiovascular: Negative for chest pain, palpitations and leg swelling.  Gastrointestinal: Negative for abdominal pain, nausea and vomiting.     OBJECTIVE:  Today's Vitals   10/13/19 0901  BP: 124/80  Pulse: 83  Temp: 99.1 F (37.3 C)  SpO2: 96%  Weight: 193 lb 12.8 oz (87.9 kg)  Height: 5' 7"  (1.702 m)   Body mass index is 30.35 kg/m.   Physical Exam Vitals signs and nursing note reviewed.  Constitutional:      Appearance: She is well-developed.  HENT:     Head: Normocephalic and atraumatic.     Mouth/Throat:     Pharynx: No oropharyngeal exudate.  Eyes:     General: No scleral icterus.    Conjunctiva/sclera: Conjunctivae normal.     Pupils: Pupils are equal, round, and reactive to light.  Neck:     Musculoskeletal: Neck supple.  Cardiovascular:     Rate and Rhythm: Normal rate and regular rhythm.     Heart sounds: Normal heart sounds. No murmur. No friction rub. No gallop.   Pulmonary:     Effort: Pulmonary effort is normal.     Breath sounds: Normal breath sounds. No wheezing,  rhonchi or rales.  Skin:    General: Skin is warm and dry.  Neurological:     Mental Status: She is alert and oriented to person, place, and time.     No results found for this or any previous visit (from the past 24 hour(s)).  No results found.   ASSESSMENT and PLAN  1. GAD (generalized anxiety disorder) Sign improved. Going to therapy, Happy with current regime. Will cont for now, room to increase buspar if needed.   2. Acute bronchitis, unspecified organism Discussed supportive treatments, adding tessalon pearls, RTC precautions given  3. Sinus congestion - triamcinolone (NASACORT) 55 MCG/ACT AERO nasal inhaler; Place 2 sprays into the nose daily.  4. Controlled type 2 diabetes mellitus without complication, without long-term current use of insulin (HCC) Stable. Cont current regime  5. Essential hypertension Controlled. Continue current regime.   Other orders - ibuprofen (ADVIL) 800 MG tablet; Take 1 tablet (800 mg total) by mouth every 8 (eight) hours. With food for 5 days and after than take every 8 hours as needed - busPIRone (BUSPAR) 5 MG tablet; Take 1 tablet (5 mg total) by mouth daily. - benzonatate (TESSALON) 100 MG capsule; Take 1-2 capsules (100-200 mg total) by mouth 3 (three) times daily as needed for cough.  Return in about 3 months (around 01/13/2020).    Rutherford Guys, MD [facility] [place], [place] [address] Ph.  [phone] Fax [phone]

## 2019-10-13 NOTE — Patient Instructions (Signed)
° ° ° °  If you have lab work done today you will be contacted with your lab results within the next 2 weeks.  If you have not heard from us then please contact us. The fastest way to get your results is to register for My Chart. ° ° °IF you received an x-ray today, you will receive an invoice from [facility]. Please contact [facility] at [phone] with questions or concerns regarding your invoice.  ° °IF you received labwork today, you will receive an invoice from [facility]. Please contact [facility] at [phone] with questions or concerns regarding your invoice.  ° °Our billing staff will not be able to assist you with questions regarding bills from these companies. ° °You will be contacted with the lab results as soon as they are available. The fastest way to get your results is to activate your My Chart account. Instructions are located on the last page of this paperwork. If you have not heard from us regarding the results in 2 weeks, please contact this office. °  ° ° ° °

## 2019-10-26 ENCOUNTER — Ambulatory Visit (INDEPENDENT_AMBULATORY_CARE_PROVIDER_SITE_OTHER): Payer: BC Managed Care – PPO | Admitting: Psychology

## 2019-10-26 DIAGNOSIS — F4323 Adjustment disorder with mixed anxiety and depressed mood: Secondary | ICD-10-CM | POA: Diagnosis not present

## 2019-11-18 ENCOUNTER — Ambulatory Visit (INDEPENDENT_AMBULATORY_CARE_PROVIDER_SITE_OTHER): Payer: BC Managed Care – PPO | Admitting: Psychology

## 2019-11-18 DIAGNOSIS — F4323 Adjustment disorder with mixed anxiety and depressed mood: Secondary | ICD-10-CM | POA: Diagnosis not present

## 2019-12-09 ENCOUNTER — Ambulatory Visit (INDEPENDENT_AMBULATORY_CARE_PROVIDER_SITE_OTHER): Payer: BC Managed Care – PPO | Admitting: Psychology

## 2019-12-09 DIAGNOSIS — F4323 Adjustment disorder with mixed anxiety and depressed mood: Secondary | ICD-10-CM

## 2019-12-28 ENCOUNTER — Ambulatory Visit (INDEPENDENT_AMBULATORY_CARE_PROVIDER_SITE_OTHER): Payer: BC Managed Care – PPO | Admitting: Psychology

## 2019-12-28 DIAGNOSIS — F4323 Adjustment disorder with mixed anxiety and depressed mood: Secondary | ICD-10-CM | POA: Diagnosis not present

## 2020-01-09 ENCOUNTER — Ambulatory Visit: Payer: BC Managed Care – PPO | Admitting: Registered Nurse

## 2020-01-09 ENCOUNTER — Telehealth: Payer: Self-pay | Admitting: Family Medicine

## 2020-01-09 ENCOUNTER — Other Ambulatory Visit: Payer: Self-pay

## 2020-01-09 ENCOUNTER — Encounter: Payer: Self-pay | Admitting: Registered Nurse

## 2020-01-09 VITALS — BP 147/92 | HR 98 | Temp 98.0°F | Ht 67.0 in | Wt 197.0 lb

## 2020-01-09 DIAGNOSIS — S0502XA Injury of conjunctiva and corneal abrasion without foreign body, left eye, initial encounter: Secondary | ICD-10-CM | POA: Diagnosis not present

## 2020-01-09 MED ORDER — DICLOFENAC SODIUM 0.1 % OP SOLN: 1.0000 [drp] | Freq: Three times a day (TID) | OPHTHALMIC | 0 refills | Status: DC

## 2020-01-09 MED ORDER — OFLOXACIN 0.3 % OP SOLN: 1.0000 [drp] | Freq: Four times a day (QID) | OPHTHALMIC | 0 refills | Status: DC

## 2020-01-09 MED ORDER — CIPROFLOXACIN HCL 0.3 % OP SOLN: 1.0000 [drp] | OPHTHALMIC | 0 refills | Status: DC

## 2020-01-09 NOTE — Telephone Encounter (Signed)
Have sent ofloxacin for abx. May take PO OTC analgesics (ibuprofen 454m PO tid if tolerated) for pain and inflammation relief rather than waiting for the ophthalmic NSAID, which the pharmacy may not stock.  Thank you  RKathrin Ruddy NP

## 2020-01-09 NOTE — Telephone Encounter (Signed)
Patient was informed.

## 2020-01-09 NOTE — Patient Instructions (Signed)
° ° ° °  If you have lab work done today you will be contacted with your lab results within the next 2 weeks.  If you have not heard from us then please contact us. The fastest way to get your results is to register for My Chart. ° ° °IF you received an x-ray today, you will receive an invoice from [facility]. Please contact [facility] at [phone] with questions or concerns regarding your invoice.  ° °IF you received labwork today, you will receive an invoice from [facility]. Please contact [facility] at [phone] with questions or concerns regarding your invoice.  ° °Our billing staff will not be able to assist you with questions regarding bills from these companies. ° °You will be contacted with the lab results as soon as they are available. The fastest way to get your results is to activate your My Chart account. Instructions are located on the last page of this paperwork. If you have not heard from us regarding the results in 2 weeks, please contact this office. °  ° ° ° °

## 2020-01-09 NOTE — Progress Notes (Signed)
Acute Office Visit  Subjective:    Patient ID: Stacy Hull, female    DOB: 08-30-1971, 49 y.o.   MRN: [id number]  Chief Complaint  Patient presents with  . Eye Pain    patient states that she tried to rub her eye and scrateched her left cornea . patient eye is a little swollen at the moment , and vision blurred  PHQ9 = 16    HPI Patient is in today for corneal abrasion  States that she was lying with her dog this morning and she feels she got some pet dander in her eye - she had rubbed at it but now feels there's a scratch. She has had this happen before and wants to make sure she's not at risk for infection or complication. No change to visual acuity - wears glasses, not contacts - no headache or other facial pain - no drainage or discharge from eye. Feels somewhat like medial canthus on affected eye is inflamed as well.  Past Medical History:  Diagnosis Date  . Allergic rhinitis   . Anxiety   . Asthma    after URI  . Depression   . GERD (gastroesophageal reflux disease)    takes TUMS  . HTN (hypertension)   . Hyperlipidemia   . Hyperparathyroidism ([place])   . Pneumonia     Past Surgical History:  Procedure Laterality Date  . CERVICAL CONE BIOPSY    . CESAREAN SECTION  2005  . HYSTEROSCOPY WITH NOVASURE  11/26/2012   Procedure: HYSTEROSCOPY WITH NOVASURE;  Surgeon: Marvene Staff, MD;  Location: [facility];  Service: Gynecology;  Laterality: N/A;  . LAPAROSCOPIC LYSIS OF ADHESIONS N/A 11/21/2016   Procedure: EXTENSIVE LAPAROSCOPIC LYSIS OF ADHESIONS;  Surgeon: Servando Salina, MD;  Location: [facility];  Service: Gynecology;  Laterality: N/A;  . LAPAROSCOPY N/A 11/21/2016   Procedure: LAPAROSCOPY DIAGNOSTIC, LAPAROSCOPIC REMOVAL OF FOREIGN BODY;  Surgeon: Ralene Ok, MD;  Location: [facility];  Service: General;  Laterality: N/A;  . PARATHYROIDECTOMY  1999   benign  . REPAIR VAGINAL CUFF N/A 12/14/2016   Procedure: REPAIR VAGINAL CUFF;  Surgeon: Brien Few,  MD;  Location: [facility];  Service: Gynecology;  Laterality: N/A;  . ROBOTIC ASSISTED LAPAROSCOPIC OVARIAN CYSTECTOMY Right 10/01/2015   Procedure: ROBOTIC ASSISTED LAPAROSCOPIC BILATERAL SALPINGECTOMY WITH LYSIS OF ADHESIONS AND RIGHT ADNEXAL CYST;  Surgeon: Princess Bruins, MD;  Location: [facility];  Service: Gynecology;  Laterality: Right;  POSSIBLE robot .  DO NOT DRAPE THE ROBOT.....she will use the laparoscopic camera to start.    . ROBOTIC ASSISTED TOTAL HYSTERECTOMY WITH SALPINGECTOMY N/A 11/21/2016   Procedure: ROBOTIC ASSISTED TOTAL HYSTERECTOMY WITH  EXTENSIVE LYSIS OF ADHESIONS;  Surgeon: Servando Salina, MD;  Location: [facility];  Service: Gynecology;  Laterality: N/A;  . TUBAL LIGATION    . UMBILICAL HERNIA REPAIR    . WISDOM TOOTH EXTRACTION      Family History  Problem Relation Age of Onset  . Depression Mother   . Hypertension Father   . Aneurysm Father 63       cerebral  . Diabetes Maternal Grandmother   . Diabetes Maternal Grandfather   . Stroke Paternal Grandfather 11    Social History   Socioeconomic History  . Marital status: Married    Spouse name: Quanasia Defino  . Number of children: 2  . Years of education: Master's +  . Highest education level: Not on file  Occupational History  . Occupation: [employer]  Employer: [employer]    Comment: [employer]  Tobacco Use  . Smoking status: Never Smoker  . Smokeless tobacco: Never Used  Substance and Sexual Activity  . Alcohol use: Yes    Alcohol/week: 4.0 standard drinks    Types: 4 Standard drinks or equivalent per week    Comment: occasionally  . Drug use: No  . Sexual activity: Yes    Partners: Male    Birth control/protection: Surgical  Other Topics Concern  . Not on file  Social History Narrative   Lives with her husband and their 2 children. Walks 3x's weekly for exercise.   Social Determinants of Health   Financial Resource Strain:   . Difficulty of  Paying Living Expenses: Not on file  Food Insecurity:   . Worried About Charity fundraiser in the Last Year: Not on file  . Ran Out of Food in the Last Year: Not on file  Transportation Needs:   . Lack of Transportation (Medical): Not on file  . Lack of Transportation (Non-Medical): Not on file  Physical Activity:   . Days of Exercise per Week: Not on file  . Minutes of Exercise per Session: Not on file  Stress:   . Feeling of Stress : Not on file  Social Connections:   . Frequency of Communication with Friends and Family: Not on file  . Frequency of Social Gatherings with Friends and Family: Not on file  . Attends Religious Services: Not on file  . Active Member of Clubs or Organizations: Not on file  . Attends Archivist Meetings: Not on file  . Marital Status: Not on file  Intimate Partner Violence:   . Fear of Current or Ex-Partner: Not on file  . Emotionally Abused: Not on file  . Physically Abused: Not on file  . Sexually Abused: Not on file    Outpatient Medications Prior to Visit  Medication Sig Dispense Refill  . ALPRAZolam (XANAX) 0.5 MG tablet TAKE 1 TABLET(0.5 MG) BY MOUTH DAILY AS NEEDED FOR ANXIETY 30 tablet 2  . busPIRone (BUSPAR) 5 MG tablet Take 1 tablet (5 mg total) by mouth daily. 90 tablet 1  . citalopram (CELEXA) 40 MG tablet TAKE 1 TABLET(40 MG) BY MOUTH DAILY 90 tablet 3  . cyclobenzaprine (FLEXERIL) 10 MG tablet Take 1 tablet (10 mg total) by mouth 3 (three) times daily as needed for muscle spasms. 30 tablet 0  . ibuprofen (ADVIL) 800 MG tablet Take 1 tablet (800 mg total) by mouth every 8 (eight) hours. With food for 5 days and after than take every 8 hours as needed 60 tablet 2  . lisinopril-hydrochlorothiazide (ZESTORETIC) 20-25 MG tablet Take 1 tablet by mouth daily. 90 tablet 3  . metFORMIN (GLUCOPHAGE) 500 MG tablet Take 1 tablet (500 mg total) by mouth daily with breakfast. 90 tablet 1  . Multiple Vitamin (MULTIVITAMIN WITH MINERALS) TABS  tablet Take 1 tablet by mouth daily.    . prazosin (MINIPRESS) 1 MG capsule Take 1-2 capsules (1-2 mg total) by mouth at bedtime. 60 capsule 1  . traZODone (DESYREL) 50 MG tablet Take 1-2 tablets (50-100 mg total) by mouth at bedtime. 60 tablet 4  . triamcinolone (NASACORT) 55 MCG/ACT AERO nasal inhaler Place 2 sprays into the nose daily. 1 Inhaler 12  . benzonatate (TESSALON) 100 MG capsule Take 1-2 capsules (100-200 mg total) by mouth 3 (three) times daily as needed for cough. (Patient not taking: Reported on 01/09/2020) 40  capsule 1  . predniSONE (DELTASONE) 20 MG tablet Take 3 tablets (60 mg total) by mouth daily. (Patient not taking: Reported on 01/09/2020) 12 tablet 0   No facility-administered medications prior to visit.    Allergies  Allergen Reactions  . Erythromycin Other (See Comments)    Reaction:  GI upset     Review of Systems  Constitutional: Negative.   HENT: Negative.   Eyes: Positive for pain, redness and itching. Negative for photophobia, discharge and visual disturbance.  Respiratory: Negative.   Cardiovascular: Negative.   Gastrointestinal: Negative.   Endocrine: Negative.   Genitourinary: Negative.   Musculoskeletal: Negative.   Skin: Negative.   Allergic/Immunologic: Negative.   Neurological: Negative.   Hematological: Negative.   Psychiatric/Behavioral: Negative.   All other systems reviewed and are negative.      Objective:    Physical Exam Vitals and nursing note reviewed.  Constitutional:      Appearance: Normal appearance. She is normal weight.  Eyes:     General: Lids are normal. Lids are everted, no foreign bodies appreciated. Vision grossly intact. Gaze aligned appropriately. No allergic shiner, visual field deficit or scleral icterus.       Right eye: No discharge.        Left eye: No foreign body, discharge or hordeolum.     Extraocular Movements: Extraocular movements intact.     Conjunctiva/sclera:     Left eye: Left conjunctiva is  injected. Chemosis and exudate present. No hemorrhage.    Pupils: Pupils are equal, round, and reactive to light.  Cardiovascular:     Rate and Rhythm: Normal rate and regular rhythm.  Pulmonary:     Effort: Pulmonary effort is normal. No respiratory distress.  Musculoskeletal:     Cervical back: Normal range of motion and neck supple.  Skin:    Capillary Refill: Capillary refill takes less than 2 seconds.  Neurological:     General: No focal deficit present.     Mental Status: She is alert and oriented to person, place, and time. Mental status is at baseline.     Cranial Nerves: No cranial nerve deficit.  Psychiatric:        Mood and Affect: Mood normal.        Behavior: Behavior normal.        Thought Content: Thought content normal.        Judgment: Judgment normal.     BP (!) 147/92   Pulse 98   Temp 98 F (36.7 C) (Temporal)   Ht 5' 7"  (1.702 m)   Wt 197 lb (89.4 kg)   LMP 11/03/2016 (Exact Date)   SpO2 96%   BMI 30.85 kg/m  Wt Readings from Last 3 Encounters:  01/09/20 197 lb (89.4 kg)  10/13/19 193 lb 12.8 oz (87.9 kg)  10/10/19 195 lb (88.5 kg)    Health Maintenance Due  Topic Date Due  . MAMMOGRAM  09/03/2017  . OPHTHALMOLOGY EXAM  12/01/2017  . HEMOGLOBIN A1C  12/15/2019    There are no preventive care reminders to display for this patient.   Lab Results  Component Value Date   TSH 1.480 06/14/2019   Lab Results  Component Value Date   WBC 11.1 (A) 02/08/2019   HGB 16.0 (A) 02/08/2019   HCT 45.6 (A) 02/08/2019   MCV 93.9 02/08/2019   PLT 269 12/15/2016   Lab Results  Component Value Date   NA 136 09/15/2019   K 4.0 09/15/2019   CO2 20 09/15/2019  GLUCOSE 152 (H) 09/15/2019   BUN 9 09/15/2019   CREATININE 0.58 09/15/2019   BILITOT 0.5 09/15/2019   ALKPHOS 57 09/15/2019   AST 20 09/15/2019   ALT 17 09/15/2019   PROT 7.5 09/15/2019   ALBUMIN 4.8 09/15/2019   CALCIUM 9.9 09/15/2019   ANIONGAP 7 11/22/2016   Lab Results  Component  Value Date   CHOL 237 (H) 09/15/2019   Lab Results  Component Value Date   HDL 66 09/15/2019   Lab Results  Component Value Date   LDLCALC 141 (H) 09/15/2019   Lab Results  Component Value Date   TRIG 171 (H) 09/15/2019   Lab Results  Component Value Date   CHOLHDL 3.6 09/15/2019   Lab Results  Component Value Date   HGBA1C 6.5 (H) 06/14/2019       Assessment & Plan:   Problem List Items Addressed This Visit    None    Visit Diagnoses    Abrasion of left cornea, initial encounter    -  Primary   Relevant Medications   diclofenac (VOLTAREN) 0.1 % ophthalmic solution   ciprofloxacin (CILOXAN) 0.3 % ophthalmic solution   ofloxacin (OCUFLOX) 0.3 % ophthalmic solution       Meds ordered this encounter  Medications  . diclofenac (VOLTAREN) 0.1 % ophthalmic solution    Sig: Place 1 drop into the left eye 3 (three) times daily.    Dispense:  2.5 mL    Refill:  0    Order Specific Question:   Supervising Provider    Answer:   Delia Chimes A [id number]  . ciprofloxacin (CILOXAN) 0.3 % ophthalmic solution    Sig: Place 1 drop into the left eye every 2 (two) hours. Administer 1 drop, every 2 hours, while awake, for 2 days. Then 1 drop, every 4 hours, while awake, for the next 5 days.    Dispense:  5 mL    Refill:  0    Order Specific Question:   Supervising Provider    Answer:   Delia Chimes A [id number]  . ofloxacin (OCUFLOX) 0.3 % ophthalmic solution    Sig: Place 1 drop into the left eye 4 (four) times daily.    Dispense:  5 mL    Refill:  0    Order Specific Question:   Supervising Provider    Answer:   Forrest Moron [id number]   PLAN  Apparent irritation and swelling of L cornea. Will give abx to prevent infection and suggest OTC NSAIDs if pharmacy does not carry ophth NSAIDs.  Given return precautions   Work from home today, bandage placed over eye  Patient encouraged to call clinic with any questions, comments, or concerns.    Maximiano Coss,  NP

## 2020-01-09 NOTE — Telephone Encounter (Signed)
Please advise 

## 2020-01-09 NOTE — Telephone Encounter (Signed)
Pt needs RX had appointment today   States that one prescription is  not available until tomorrow and  Would like to know if she will be ok enough to wait.   The other medication needs to be sent since it was never received  Please advise

## 2020-01-13 ENCOUNTER — Ambulatory Visit: Payer: BC Managed Care – PPO | Admitting: Family Medicine

## 2020-01-16 ENCOUNTER — Ambulatory Visit (INDEPENDENT_AMBULATORY_CARE_PROVIDER_SITE_OTHER): Payer: BC Managed Care – PPO | Admitting: Psychology

## 2020-01-16 DIAGNOSIS — F4323 Adjustment disorder with mixed anxiety and depressed mood: Secondary | ICD-10-CM | POA: Diagnosis not present

## 2020-01-24 ENCOUNTER — Ambulatory Visit (INDEPENDENT_AMBULATORY_CARE_PROVIDER_SITE_OTHER): Payer: BC Managed Care – PPO | Admitting: Psychology

## 2020-01-24 DIAGNOSIS — F4323 Adjustment disorder with mixed anxiety and depressed mood: Secondary | ICD-10-CM | POA: Diagnosis not present

## 2020-01-26 ENCOUNTER — Ambulatory Visit: Payer: BC Managed Care – PPO | Admitting: Family Medicine

## 2020-01-26 ENCOUNTER — Other Ambulatory Visit: Payer: Self-pay

## 2020-01-26 ENCOUNTER — Encounter: Payer: Self-pay | Admitting: Family Medicine

## 2020-01-26 VITALS — BP 125/80 | HR 94 | Temp 97.7°F | Resp 18 | Ht 67.0 in | Wt 194.0 lb

## 2020-01-26 DIAGNOSIS — I10 Essential (primary) hypertension: Secondary | ICD-10-CM

## 2020-01-26 DIAGNOSIS — E119 Type 2 diabetes mellitus without complications: Secondary | ICD-10-CM | POA: Diagnosis not present

## 2020-01-26 DIAGNOSIS — F331 Major depressive disorder, recurrent, moderate: Secondary | ICD-10-CM | POA: Diagnosis not present

## 2020-01-26 DIAGNOSIS — E7800 Pure hypercholesterolemia, unspecified: Secondary | ICD-10-CM

## 2020-01-26 DIAGNOSIS — F411 Generalized anxiety disorder: Secondary | ICD-10-CM | POA: Diagnosis not present

## 2020-01-26 DIAGNOSIS — R0981 Nasal congestion: Secondary | ICD-10-CM

## 2020-01-26 LAB — HEMOGLOBIN A1C
Est. average glucose Bld gHb Est-mCnc: 143 mg/dL
Hgb A1c MFr Bld: 6.6 % — ABNORMAL HIGH (ref 4.8–5.6)

## 2020-01-26 MED ORDER — BUSPIRONE HCL 10 MG PO TABS: 10.0000 mg | ORAL_TABLET | Freq: Two times a day (BID) | ORAL | 2 refills | Status: DC

## 2020-01-26 MED ORDER — ALPRAZOLAM 0.5 MG PO TABS: 0.5000 mg | ORAL_TABLET | Freq: Every day | ORAL | 2 refills | Status: DC | PRN

## 2020-01-26 MED ORDER — ATORVASTATIN CALCIUM 20 MG PO TABS: 20.0000 mg | ORAL_TABLET | Freq: Every day | ORAL | 3 refills | Status: DC

## 2020-01-26 MED ORDER — TRIAMCINOLONE ACETONIDE 55 MCG/ACT NA AERO: 2.0000 | INHALATION_SPRAY | Freq: Every day | NASAL | 12 refills | Status: AC

## 2020-01-26 NOTE — Progress Notes (Signed)
2/25/20218:27 AM  Stacy Hull [birth year], 49 y.o., female [id number]  Chief Complaint  Patient presents with  . Hypertension  . anxiety/ depression    phq9, gad 7 done  . Diabetes    HPI:   Patient is a 49 y.o. 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, teaching both remotely and in person Insurance will stop paying for rent of home while her home continues to be displaced She continues to go to counseling weekly Is starting to have intrusive thoughts again, specially with recent bad weather Really struggling with social aspects of work pmp reviewed Reports passive SI and urge to self harm is getting much better - has been working hard on it with her therapist PH9 and GAd7 noted  Lab Results  Component Value Date   HGBA1C 6.5 (H) 06/14/2019   HGBA1C 6.6 12/05/2017   HGBA1C 6.8 (H) 10/23/2017   Lab Results  Component Value Date   LDLCALC 141 (H) 09/15/2019   CREATININE 0.58 09/15/2019   GAD 7 : Generalized Anxiety Score 01/26/2020 10/13/2019 06/14/2019 05/13/2019  Nervous, Anxious, on Edge 3 1 1 3   Control/stop worrying 3 1 1 2   Worry too much - different things 3 1 1 2   Trouble relaxing 3 1 1 3   Restless 3 0 0 3  Easily annoyed or irritable 3 1 2 3   Afraid - awful might happen 3 1 1 3   Total GAD 7 Score 21 6 7 19   Anxiety Difficulty Extremely difficult Somewhat difficult - Somewhat difficult     Depression screen [facility] 2/9 01/26/2020 01/09/2020 10/13/2019  Decreased Interest 3 2 0  Down, Depressed, Hopeless 3 3 0  PHQ - 2 Score 6 5 0  Altered sleeping 1 1 -  Tired, decreased energy 3 3 -  Change in appetite 0 1 -  Feeling bad or failure about yourself  3 3 -  Trouble concentrating 3 2 -  Moving  slowly or fidgety/restless 1 0 -  Suicidal thoughts 1 1 -  PHQ-9 Score 18 16 -  Difficult doing work/chores Extremely dIfficult - -  Some recent data might be hidden    Fall Risk  01/26/2020 01/09/2020 10/13/2019 09/15/2019 07/19/2019  Falls in the past year? 0 0 0 0 0  Number falls in past yr: 0 0 0 0 0  Injury with Fall? 0 0 0 0 0  Follow up Falls evaluation completed Falls evaluation completed - - -     Allergies  Allergen Reactions  . Erythromycin Other (See Comments)    Reaction:  GI upset     Prior to Admission medications   Medication Sig Start Date End Date Taking? Authorizing Provider  ALPRAZolam (XANAX) 0.5 MG tablet TAKE 1 TABLET(0.5 MG) BY MOUTH DAILY AS NEEDED FOR ANXIETY 09/06/19  Yes Rutherford Guys, MD  busPIRone (BUSPAR) 5 MG tablet Take 1 tablet (5 mg total) by mouth daily. 10/13/19  Yes Rutherford Guys, MD  citalopram (CELEXA) 40 MG tablet TAKE 1 TABLET(40 MG) BY MOUTH DAILY 06/14/19  Yes Rutherford Guys, MD  cyclobenzaprine (FLEXERIL) 10 MG tablet Take 1 tablet (10 mg total) by mouth 3 (three) times daily as needed for muscle spasms. 05/13/19  Yes Pamella Pert, Benay Spice  M, MD  ibuprofen (ADVIL) 800 MG tablet Take 1 tablet (800 mg total) by mouth every 8 (eight) hours. With food for 5 days and after than take every 8 hours as needed 10/13/19  Yes Rutherford Guys, MD  lisinopril-hydrochlorothiazide (ZESTORETIC) 20-25 MG tablet Take 1 tablet by mouth daily. 06/14/19  Yes Rutherford Guys, MD  metFORMIN (GLUCOPHAGE) 500 MG tablet Take 1 tablet (500 mg total) by mouth daily with breakfast. 09/15/19  Yes Rutherford Guys, MD  Multiple Vitamin (MULTIVITAMIN WITH MINERALS) TABS tablet Take 1 tablet by mouth daily.   Yes [provider]  prazosin (MINIPRESS) 1 MG capsule Take 1-2 capsules (1-2 mg total) by mouth at bedtime. 05/13/19  Yes Rutherford Guys, MD  traZODone (DESYREL) 50 MG tablet Take 1-2 tablets (50-100 mg total) by mouth at bedtime. 07/19/19  Yes Rutherford Guys,  MD  triamcinolone (NASACORT) 55 MCG/ACT AERO nasal inhaler Place 2 sprays into the nose daily. 10/13/19  Yes Rutherford Guys, MD  benzonatate (TESSALON) 100 MG capsule Take 1-2 capsules (100-200 mg total) by mouth 3 (three) times daily as needed for cough. Patient not taking: Reported on 01/09/2020 10/13/19   Rutherford Guys, MD  ciprofloxacin (CILOXAN) 0.3 % ophthalmic solution Place 1 drop into the left eye every 2 (two) hours. Administer 1 drop, every 2 hours, while awake, for 2 days. Then 1 drop, every 4 hours, while awake, for the next 5 days. 01/09/20   Maximiano Coss, NP  diclofenac (VOLTAREN) 0.1 % ophthalmic solution Place 1 drop into the left eye 3 (three) times daily. 01/09/20   Maximiano Coss, NP  ofloxacin (OCUFLOX) 0.3 % ophthalmic solution Place 1 drop into the left eye 4 (four) times daily. 01/09/20   Maximiano Coss, NP  predniSONE (DELTASONE) 20 MG tablet Take 3 tablets (60 mg total) by mouth daily. Patient not taking: Reported on 01/09/2020 10/10/19   Hayden Rasmussen, MD    Past Medical History:  Diagnosis Date  . Allergic rhinitis   . Anxiety   . Asthma    after URI  . Depression   . GERD (gastroesophageal reflux disease)    takes TUMS  . HTN (hypertension)   . Hyperlipidemia   . Hyperparathyroidism ([place])   . Pneumonia     Past Surgical History:  Procedure Laterality Date  . CERVICAL CONE BIOPSY    . CESAREAN SECTION  2005  . HYSTEROSCOPY WITH NOVASURE  11/26/2012   Procedure: HYSTEROSCOPY WITH NOVASURE;  Surgeon: Marvene Staff, MD;  Location: [facility];  Service: Gynecology;  Laterality: N/A;  . LAPAROSCOPIC LYSIS OF ADHESIONS N/A 11/21/2016   Procedure: EXTENSIVE LAPAROSCOPIC LYSIS OF ADHESIONS;  Surgeon: Servando Salina, MD;  Location: [facility];  Service: Gynecology;  Laterality: N/A;  . LAPAROSCOPY N/A 11/21/2016   Procedure: LAPAROSCOPY DIAGNOSTIC, LAPAROSCOPIC REMOVAL OF FOREIGN BODY;  Surgeon: Ralene Ok, MD;  Location: [facility];  Service: General;   Laterality: N/A;  . PARATHYROIDECTOMY  1999   benign  . REPAIR VAGINAL CUFF N/A 12/14/2016   Procedure: REPAIR VAGINAL CUFF;  Surgeon: Brien Few, MD;  Location: [facility];  Service: Gynecology;  Laterality: N/A;  . ROBOTIC ASSISTED LAPAROSCOPIC OVARIAN CYSTECTOMY Right 10/01/2015   Procedure: ROBOTIC ASSISTED LAPAROSCOPIC BILATERAL SALPINGECTOMY WITH LYSIS OF ADHESIONS AND RIGHT ADNEXAL CYST;  Surgeon: Princess Bruins, MD;  Location: [facility];  Service: Gynecology;  Laterality: Right;  POSSIBLE robot .  DO NOT DRAPE THE ROBOT.....she will use the laparoscopic camera to start.    Marland Kitchen  ROBOTIC ASSISTED TOTAL HYSTERECTOMY WITH SALPINGECTOMY N/A 11/21/2016   Procedure: ROBOTIC ASSISTED TOTAL HYSTERECTOMY WITH  EXTENSIVE LYSIS OF ADHESIONS;  Surgeon: Servando Salina, MD;  Location: [facility];  Service: Gynecology;  Laterality: N/A;  . TUBAL LIGATION    . UMBILICAL HERNIA REPAIR    . WISDOM TOOTH EXTRACTION      Social History   Tobacco Use  . Smoking status: Never Smoker  . Smokeless tobacco: Never Used  Substance Use Topics  . Alcohol use: Yes    Alcohol/week: 4.0 standard drinks    Types: 4 Standard drinks or equivalent per week    Comment: occasionally    Family History  Problem Relation Age of Onset  . Depression Mother   . Hypertension Father   . Aneurysm Father 35       cerebral  . Diabetes Maternal Grandmother   . Diabetes Maternal Grandfather   . Stroke Paternal Grandfather 64    ROS Per hpi  OBJECTIVE:  Today's Vitals   01/26/20 0816  BP: 125/80  Pulse: 94  Resp: 18  Temp: 97.7 F (36.5 C)  TempSrc: Temporal  SpO2: 95%  Weight: 194 lb (88 kg)  Height: 5' 7"  (1.702 m)   Body mass index is 30.38 kg/m.   Physical Exam Vitals and nursing note reviewed.  Constitutional:      Appearance: She is well-developed.  HENT:     Head: Normocephalic and atraumatic.  Eyes:     General: No scleral icterus.    Conjunctiva/sclera: Conjunctivae normal.     Pupils:  Pupils are equal, round, and reactive to light.  Pulmonary:     Effort: Pulmonary effort is normal.  Musculoskeletal:     Cervical back: Neck supple.  Skin:    General: Skin is warm and dry.  Neurological:     Mental Status: She is alert and oriented to person, place, and time.  Psychiatric:        Attention and Perception: Attention normal.        Mood and Affect: Mood is anxious. Affect is tearful.        Speech: Speech normal.        Behavior: Behavior normal.        Thought Content: Thought content is not paranoid or delusional. Thought content does not include suicidal plan.        Cognition and Memory: Cognition normal.     No results found for this or any previous visit (from the past 24 hour(s)).  No results found.   ASSESSMENT and PLAN  1. GAD (generalized anxiety disorder) Not controlled. Increasing buspar. Cont with counseling. Discussed support groups.   2. Moderate episode of recurrent major depressive disorder ([place]) Not controlled. Being driven by anxiety. Consider changing celexa to another SSRI  3. Controlled type 2 diabetes mellitus without complication, without long-term current use of insulin ([place]) Checking labs today, medications will be adjusted as needed.  - Hemoglobin A1c  4. Pure hypercholesterolemia Not at goal. Starting atorvastatin  5. Essential hypertension Controlled. Continue current regime.   6. Sinus congestion - triamcinolone (NASACORT) 55 MCG/ACT AERO nasal inhaler; Place 2 sprays into the nose daily.  Other orders - busPIRone (BUSPAR) 10 MG tablet; Take 1 tablet (10 mg total) by mouth 2 (two) times daily. - atorvastatin (LIPITOR) 20 MG tablet; Take 1 tablet (20 mg total) by mouth daily. - ALPRAZolam (XANAX) 0.5 MG tablet; Take 1 tablet (0.5 mg total) by mouth daily as needed for anxiety.  Return  in about 4 weeks (around 02/23/2020). anxiety and depression    Rutherford Guys, MD [facility] [place], [place] [address] Ph.  [phone] Fax [phone]

## 2020-01-26 NOTE — Patient Instructions (Signed)
° ° ° °  If you have lab work done today you will be contacted with your lab results within the next 2 weeks.  If you have not heard from us then please contact us. The fastest way to get your results is to register for My Chart. ° ° °IF you received an x-ray today, you will receive an invoice from [facility]. Please contact [facility] at [phone] with questions or concerns regarding your invoice.  ° °IF you received labwork today, you will receive an invoice from [facility]. Please contact [facility] at [phone] with questions or concerns regarding your invoice.  ° °Our billing staff will not be able to assist you with questions regarding bills from these companies. ° °You will be contacted with the lab results as soon as they are available. The fastest way to get your results is to activate your My Chart account. Instructions are located on the last page of this paperwork. If you have not heard from us regarding the results in 2 weeks, please contact this office. °  ° ° ° °

## 2020-01-28 ENCOUNTER — Other Ambulatory Visit: Payer: Self-pay | Admitting: Family Medicine

## 2020-02-08 ENCOUNTER — Ambulatory Visit (INDEPENDENT_AMBULATORY_CARE_PROVIDER_SITE_OTHER): Payer: BC Managed Care – PPO | Admitting: Psychology

## 2020-02-08 DIAGNOSIS — F4323 Adjustment disorder with mixed anxiety and depressed mood: Secondary | ICD-10-CM

## 2020-02-22 ENCOUNTER — Ambulatory Visit (INDEPENDENT_AMBULATORY_CARE_PROVIDER_SITE_OTHER): Payer: BC Managed Care – PPO | Admitting: Psychology

## 2020-02-22 DIAGNOSIS — F4323 Adjustment disorder with mixed anxiety and depressed mood: Secondary | ICD-10-CM | POA: Diagnosis not present

## 2020-02-23 ENCOUNTER — Other Ambulatory Visit: Payer: Self-pay

## 2020-02-23 ENCOUNTER — Encounter: Payer: Self-pay | Admitting: Family Medicine

## 2020-02-23 ENCOUNTER — Ambulatory Visit: Payer: BC Managed Care – PPO | Admitting: Family Medicine

## 2020-02-23 VITALS — BP 143/82 | HR 91 | Temp 97.7°F | Ht 67.0 in | Wt 195.0 lb

## 2020-02-23 DIAGNOSIS — E7800 Pure hypercholesterolemia, unspecified: Secondary | ICD-10-CM

## 2020-02-23 DIAGNOSIS — F411 Generalized anxiety disorder: Secondary | ICD-10-CM

## 2020-02-23 MED ORDER — BUSPIRONE HCL 5 MG PO TABS: 5.0000 mg | ORAL_TABLET | Freq: Two times a day (BID) | ORAL | 1 refills | Status: DC

## 2020-02-23 NOTE — Patient Instructions (Signed)
° ° ° °  If you have lab work done today you will be contacted with your lab results within the next 2 weeks.  If you have not heard from us then please contact us. The fastest way to get your results is to register for My Chart. ° ° °IF you received an x-ray today, you will receive an invoice from [facility]. Please contact [facility] at [phone] with questions or concerns regarding your invoice.  ° °IF you received labwork today, you will receive an invoice from [facility]. Please contact [facility] at [phone] with questions or concerns regarding your invoice.  ° °Our billing staff will not be able to assist you with questions regarding bills from these companies. ° °You will be contacted with the lab results as soon as they are available. The fastest way to get your results is to activate your My Chart account. Instructions are located on the last page of this paperwork. If you have not heard from us regarding the results in 2 weeks, please contact this office. °  ° ° ° °

## 2020-02-23 NOTE — Progress Notes (Signed)
3/25/20218:39 AM  Stacy Hull December 15, 1970, 49 y.o., female [id number]  Chief Complaint  Patient presents with  . GAD    follow up    HPI:   Patient is a 49 y.o. female with past medical history significant for GAD, insomnia, HTN, HLP, DM2who presents today for followup  Last OV Feb 2020 - increased buspar, started atorvastatin Doing much better than last OV Most likely moving into new home at end of April She always does better during the spring Family dynamics are much better Overall feeling "more grounded" Did not tolerate increased dose of buspar, back to 50m BID Tolerating atoravastatin PHQ9 and GAD7 noted  She goes to Dr [facility]for [facility] obgyn and mammo Reports yearly normal mammo Last OV summer of 2020  GAD 7 : Generalized Anxiety Score 02/23/2020 01/26/2020 10/13/2019 06/14/2019  Nervous, Anxious, on Edge 1 3 1 1   Control/stop worrying 1 3 1 1   Worry too much - different things 0 3 1 1   Trouble relaxing 1 3 1 1   Restless 0 3 0 0  Easily annoyed or irritable 1 3 1 2   Afraid - awful might happen 0 3 1 1   Total GAD 7 Score 4 21 6 7   Anxiety Difficulty Somewhat difficult Extremely difficult Somewhat difficult -     [facility] 02/23/2020 01/26/2020 01/09/2020  Decreased Interest 1 3 2   Down, Depressed, Hopeless 1 3 3   PHQ - 2 Score 2 6 5   Altered sleeping 1 1 1   Tired, decreased energy 1 3 3   Change in appetite 0 0 1  Feeling bad or failure about yourself  0 3 3  Trouble concentrating 0 3 2  Moving slowly or fidgety/restless 0 1 0  Suicidal thoughts 0 1 1  PHQ-9 Score 4 18 16   Difficult doing work/chores Somewhat difficult Extremely dIfficult -  Some recent data might be hidden    Fall Risk  02/23/2020 01/26/2020 01/09/2020 10/13/2019 09/15/2019  Falls in the past year? 0 0 0 0 0  Number falls in past yr: 0 0 0 0 0  Injury with Fall? 0 0 0 0 0  Follow up Falls evaluation completed Falls evaluation completed Falls evaluation completed - -     Allergies  Allergen Reactions  . Erythromycin Other (See Comments)    Reaction:  GI upset     Prior to Admission medications   Medication Sig Start Date End Date Taking? Authorizing Provider  ALPRAZolam (Duanne Moron 0.5 MG tablet Take 1 tablet (0.5 mg total) by mouth daily as needed for anxiety. 01/26/20  Yes SRutherford Guys MD  atorvastatin (LIPITOR) 20 MG tablet Take 1 tablet (20 mg total) by mouth daily. 01/26/20  Yes SRutherford Guys MD  busPIRone (BUSPAR) 10 MG tablet Take 1 tablet (10 mg total) by mouth 2 (two) times daily. 01/26/20  Yes SRutherford Guys MD  citalopram (CELEXA) 40 MG tablet TAKE 1 TABLET(40 MG) BY MOUTH DAILY 06/14/19  Yes SRutherford Guys MD  cyclobenzaprine (FLEXERIL) 10 MG tablet Take 1 tablet (10 mg total) by mouth 3 (three) times daily as needed for muscle spasms. 05/13/19  Yes SRutherford Guys MD  ibuprofen (ADVIL) 800 MG tablet Take 1 tablet (800 mg total) by mouth every 8 (eight) hours. With food for 5 days and after than take every 8 hours as needed 10/13/19  Yes SRutherford Guys MD  lisinopril-hydrochlorothiazide (ZESTORETIC) 20-25 MG tablet Take 1 tablet by mouth daily. 06/14/19  Yes  Rutherford Guys, MD  metFORMIN (GLUCOPHAGE) 500 MG tablet Take 1 tablet (500 mg total) by mouth daily with breakfast. 09/15/19  Yes Rutherford Guys, MD  Multiple Vitamin (MULTIVITAMIN WITH MINERALS) TABS tablet Take 1 tablet by mouth daily.   Yes [provider]  prazosin (MINIPRESS) 1 MG capsule TAKE 1 TO 2 CAPSULES(1 TO 2 MG) BY MOUTH AT BEDTIME 01/28/20  Yes Rutherford Guys, MD  traZODone (DESYREL) 50 MG tablet Take 1-2 tablets (50-100 mg total) by mouth at bedtime. 07/19/19  Yes Rutherford Guys, MD  triamcinolone (NASACORT) 55 MCG/ACT AERO nasal inhaler Place 2 sprays into the nose daily. 01/26/20  Yes Rutherford Guys, MD    Past Medical History:  Diagnosis Date  . Allergic rhinitis   . Anxiety   . Asthma    after URI  . Depression   . GERD (gastroesophageal  reflux disease)    takes TUMS  . HTN (hypertension)   . Hyperlipidemia   . Hyperparathyroidism ([place])   . Pneumonia     Past Surgical History:  Procedure Laterality Date  . CERVICAL CONE BIOPSY    . CESAREAN SECTION  2005  . HYSTEROSCOPY WITH NOVASURE  11/26/2012   Procedure: HYSTEROSCOPY WITH NOVASURE;  Surgeon: Marvene Staff, MD;  Location: [facility];  Service: Gynecology;  Laterality: N/A;  . LAPAROSCOPIC LYSIS OF ADHESIONS N/A 11/21/2016   Procedure: EXTENSIVE LAPAROSCOPIC LYSIS OF ADHESIONS;  Surgeon: Servando Salina, MD;  Location: [facility];  Service: Gynecology;  Laterality: N/A;  . LAPAROSCOPY N/A 11/21/2016   Procedure: LAPAROSCOPY DIAGNOSTIC, LAPAROSCOPIC REMOVAL OF FOREIGN BODY;  Surgeon: Ralene Ok, MD;  Location: [facility];  Service: General;  Laterality: N/A;  . PARATHYROIDECTOMY  1999   benign  . REPAIR VAGINAL CUFF N/A 12/14/2016   Procedure: REPAIR VAGINAL CUFF;  Surgeon: Brien Few, MD;  Location: [facility];  Service: Gynecology;  Laterality: N/A;  . ROBOTIC ASSISTED LAPAROSCOPIC OVARIAN CYSTECTOMY Right 10/01/2015   Procedure: ROBOTIC ASSISTED LAPAROSCOPIC BILATERAL SALPINGECTOMY WITH LYSIS OF ADHESIONS AND RIGHT ADNEXAL CYST;  Surgeon: Princess Bruins, MD;  Location: [facility];  Service: Gynecology;  Laterality: Right;  POSSIBLE robot .  DO NOT DRAPE THE ROBOT.....she will use the laparoscopic camera to start.    . ROBOTIC ASSISTED TOTAL HYSTERECTOMY WITH SALPINGECTOMY N/A 11/21/2016   Procedure: ROBOTIC ASSISTED TOTAL HYSTERECTOMY WITH  EXTENSIVE LYSIS OF ADHESIONS;  Surgeon: Servando Salina, MD;  Location: [facility];  Service: Gynecology;  Laterality: N/A;  . TUBAL LIGATION    . UMBILICAL HERNIA REPAIR    . WISDOM TOOTH EXTRACTION      Social History   Tobacco Use  . Smoking status: Never Smoker  . Smokeless tobacco: Never Used  Substance Use Topics  . Alcohol use: Yes    Alcohol/week: 4.0 standard drinks    Types: 4 Standard drinks or equivalent per  week    Comment: occasionally    Family History  Problem Relation Age of Onset  . Depression Mother   . Hypertension Father   . Aneurysm Father 67       cerebral  . Diabetes Maternal Grandmother   . Diabetes Maternal Grandfather   . Stroke Paternal Grandfather 61    Review of Systems  Constitutional: Negative for chills and fever.  Respiratory: Negative for cough and shortness of breath.   Cardiovascular: Negative for chest pain, palpitations and leg swelling.  Gastrointestinal: Negative for abdominal pain, nausea and vomiting.     OBJECTIVE:  Today's Vitals  02/23/20 0826 02/23/20 0909  BP: (!) 143/89 (!) 143/82  Pulse: 91   Temp: 97.7 F (36.5 C)   SpO2: 95%   Weight: 195 lb (88.5 kg)   Height: 5' 7"  (1.702 m)    Body mass index is 30.54 kg/m.   Physical Exam Vitals and nursing note reviewed.  Constitutional:      Appearance: She is well-developed.  HENT:     Head: Normocephalic and atraumatic.     Mouth/Throat:     Pharynx: No oropharyngeal exudate.  Eyes:     General: No scleral icterus.    Conjunctiva/sclera: Conjunctivae normal.     Pupils: Pupils are equal, round, and reactive to light.  Cardiovascular:     Rate and Rhythm: Normal rate and regular rhythm.     Heart sounds: Normal heart sounds. No murmur. No friction rub. No gallop.   Pulmonary:     Effort: Pulmonary effort is normal.     Breath sounds: Normal breath sounds. No wheezing or rales.  Musculoskeletal:     Cervical back: Neck supple.  Skin:    General: Skin is warm and dry.  Neurological:     Mental Status: She is alert and oriented to person, place, and time.     No results found for this or any previous visit (from the past 24 hour(s)).  No results found.   ASSESSMENT and PLAN  1. GAD (generalized anxiety disorder) Controlled now that life stressors are improving. Cont current regime  2. Pure hypercholesterolemia Tolerating atorvastatin. Cont with LFM. Recheck labs at  next OV  Other orders - busPIRone (BUSPAR) 5 MG tablet; Take 1 tablet (5 mg total) by mouth 2 (two) times daily.  Requested mammo results from obgyn  Return in about 3 months (around 05/25/2020).    Rutherford Guys, MD Primary [facility] at [place], [place] [address] Ph.  [phone] Fax [phone]

## 2020-03-01 ENCOUNTER — Other Ambulatory Visit: Payer: Self-pay | Admitting: Family Medicine

## 2020-03-01 NOTE — Telephone Encounter (Signed)
Requested medication (s) are due for refill today: yes  Requested medication (s) are on the active medication list: yes  Last refill: 05/13/2019  Future visit scheduled: yes  Notes to clinic:  not delegated   Requested Prescriptions  Pending Prescriptions Disp Refills   cyclobenzaprine (FLEXERIL) 10 MG tablet [Pharmacy Med Name: CYCLOBENZAPRINE 10MG TABLETS] 30 tablet 0    Sig: TAKE 1 TABLET(10 MG) BY MOUTH THREE TIMES DAILY AS NEEDED FOR MUSCLE SPASMS      Not Delegated - Analgesics:  Muscle Relaxants Failed - 03/01/2020 10:36 AM      Failed - This refill cannot be delegated      Passed - Valid encounter within last 6 months    Recent Outpatient Visits           1 week ago GAD (generalized anxiety disorder)   [facility], Lilia Argue, MD   1 month ago GAD (generalized anxiety disorder)   [facility], Lilia Argue, MD   1 month ago Abrasion of left cornea, initial encounter   [facility], [place], NP   4 months ago GAD (generalized anxiety disorder)   [facility], Lilia Argue, MD   5 months ago GAD (generalized anxiety disorder)   [facility], Lilia Argue, MD       Future Appointments             In 2 months Rutherford Guys, MD Primary [facility] at [place], [facility]

## 2020-03-05 ENCOUNTER — Ambulatory Visit (INDEPENDENT_AMBULATORY_CARE_PROVIDER_SITE_OTHER): Payer: BC Managed Care – PPO | Admitting: Psychology

## 2020-03-05 DIAGNOSIS — F4323 Adjustment disorder with mixed anxiety and depressed mood: Secondary | ICD-10-CM | POA: Diagnosis not present

## 2020-03-20 ENCOUNTER — Ambulatory Visit (INDEPENDENT_AMBULATORY_CARE_PROVIDER_SITE_OTHER): Payer: BC Managed Care – PPO | Admitting: Psychology

## 2020-03-20 DIAGNOSIS — F4323 Adjustment disorder with mixed anxiety and depressed mood: Secondary | ICD-10-CM

## 2020-04-04 ENCOUNTER — Ambulatory Visit (INDEPENDENT_AMBULATORY_CARE_PROVIDER_SITE_OTHER): Payer: BC Managed Care – PPO | Admitting: Psychology

## 2020-04-04 DIAGNOSIS — F4323 Adjustment disorder with mixed anxiety and depressed mood: Secondary | ICD-10-CM | POA: Diagnosis not present

## 2020-04-25 ENCOUNTER — Ambulatory Visit (INDEPENDENT_AMBULATORY_CARE_PROVIDER_SITE_OTHER): Payer: BC Managed Care – PPO | Admitting: Psychology

## 2020-04-25 DIAGNOSIS — F4323 Adjustment disorder with mixed anxiety and depressed mood: Secondary | ICD-10-CM

## 2020-05-09 ENCOUNTER — Ambulatory Visit (INDEPENDENT_AMBULATORY_CARE_PROVIDER_SITE_OTHER): Payer: BC Managed Care – PPO | Admitting: Psychology

## 2020-05-09 DIAGNOSIS — F4323 Adjustment disorder with mixed anxiety and depressed mood: Secondary | ICD-10-CM | POA: Diagnosis not present

## 2020-05-17 ENCOUNTER — Other Ambulatory Visit: Payer: Self-pay | Admitting: Family Medicine

## 2020-05-17 NOTE — Telephone Encounter (Signed)
Patient is requesting a refill of the following medications: Requested Prescriptions   Pending Prescriptions Disp Refills   traZODone (DESYREL) 50 MG tablet [Pharmacy Med Name: TRAZODONE 50MG TABLETS] 60 tablet 4    Sig: TAKE 1 TO 2 TABLETS(50 TO 100 MG) BY MOUTH AT BEDTIME    Date of patient request: 05/17/20 Last office visit: 02/23/20 Date of last refill: 07/18/20 Last refill amount: 60 x4rf Follow up time period per chart: 06/12/20

## 2020-05-17 NOTE — Telephone Encounter (Signed)
Requested medication (s) are due for refill today:yes Requested medication (s) are on the active medication list: yes   Last refill: 07/19/2019   #60   4 refills  Future visit scheduled  Yes 06/12/2020  Notes to clinic: not delegated  Requested Prescriptions  Pending Prescriptions Disp Refills   traZODone (DESYREL) 50 MG tablet [Pharmacy Med Name: TRAZODONE 50MG TABLETS] 60 tablet 4    Sig: TAKE 1 TO 2 TABLETS(50 TO 100 MG) BY MOUTH AT BEDTIME      Psychiatry: Antidepressants - Serotonin Modulator Passed - 05/17/2020  3:05 PM      Passed - Completed PHQ-2 or PHQ-9 in the last 360 days.      Passed - Valid encounter within last 6 months    Recent Outpatient Visits           2 months ago GAD (generalized anxiety disorder)   [facility], Lilia Argue, MD   3 months ago GAD (generalized anxiety disorder)   [facility], Lilia Argue, MD   4 months ago Abrasion of left cornea, initial encounter   [facility] at [place], NP   7 months ago GAD (generalized anxiety disorder)   [facility], Lilia Argue, MD   8 months ago GAD (generalized anxiety disorder)   [facility], Lilia Argue, MD       Future Appointments             In 3 weeks Rutherford Guys, MD Primary Care at [place], [facility]

## 2020-05-25 ENCOUNTER — Ambulatory Visit: Payer: BC Managed Care – PPO | Admitting: Family Medicine

## 2020-05-28 ENCOUNTER — Ambulatory Visit: Payer: BC Managed Care – PPO | Admitting: Family Medicine

## 2020-05-30 ENCOUNTER — Ambulatory Visit (INDEPENDENT_AMBULATORY_CARE_PROVIDER_SITE_OTHER): Payer: BC Managed Care – PPO | Admitting: Psychology

## 2020-05-30 DIAGNOSIS — F4323 Adjustment disorder with mixed anxiety and depressed mood: Secondary | ICD-10-CM

## 2020-06-12 ENCOUNTER — Ambulatory Visit (INDEPENDENT_AMBULATORY_CARE_PROVIDER_SITE_OTHER): Payer: BC Managed Care – PPO | Admitting: Family Medicine

## 2020-06-12 ENCOUNTER — Other Ambulatory Visit: Payer: Self-pay

## 2020-06-12 ENCOUNTER — Encounter: Payer: Self-pay | Admitting: Family Medicine

## 2020-06-12 VITALS — BP 138/80 | HR 100 | Temp 98.0°F | Ht 67.0 in | Wt 196.8 lb

## 2020-06-12 DIAGNOSIS — I10 Essential (primary) hypertension: Secondary | ICD-10-CM

## 2020-06-12 DIAGNOSIS — F411 Generalized anxiety disorder: Secondary | ICD-10-CM | POA: Diagnosis not present

## 2020-06-12 DIAGNOSIS — E7800 Pure hypercholesterolemia, unspecified: Secondary | ICD-10-CM | POA: Diagnosis not present

## 2020-06-12 DIAGNOSIS — E119 Type 2 diabetes mellitus without complications: Secondary | ICD-10-CM

## 2020-06-12 MED ORDER — IBUPROFEN 800 MG PO TABS: 800.0000 mg | ORAL_TABLET | Freq: Three times a day (TID) | ORAL | 2 refills | Status: DC | PRN

## 2020-06-12 NOTE — Patient Instructions (Signed)
° ° ° °  If you have lab work done today you will be contacted with your lab results within the next 2 weeks.  If you have not heard from us then please contact us. The fastest way to get your results is to register for My Chart. ° ° °IF you received an x-ray today, you will receive an invoice from [facility]. Please contact [facility] at [phone] with questions or concerns regarding your invoice.  ° °IF you received labwork today, you will receive an invoice from [facility]. Please contact [facility] at [phone] with questions or concerns regarding your invoice.  ° °Our billing staff will not be able to assist you with questions regarding bills from these companies. ° °You will be contacted with the lab results as soon as they are available. The fastest way to get your results is to activate your My Chart account. Instructions are located on the last page of this paperwork. If you have not heard from us regarding the results in 2 weeks, please contact this office. °  ° ° ° °

## 2020-06-12 NOTE — Progress Notes (Signed)
7/13/20219:55 AM  Stacy Hull 1971-01-24, 49 y.o., female [id number]  Chief Complaint  Patient presents with  . Anxiety  . Health Maintenance    mammo 07/13/20    HPI:   Patient is a 49 y.o. female with past medical history significant for GAD, insomnia, HTN,HLP,DM2who presents today for followup  Last OV march 2021 - no changes  She is overall doing much better Back at home, on summer break Anxiety much better, cont to see therapist Walking her dog in the mornings Has been able to stop buspar Cont to celexa, przosin, trazadone Cont to take metformin, atorvastatin, lisinorpil-hctz wo issues Takes ibu prn for post surgical pain   Lab Results  Component Value Date   HGBA1C 6.6 (H) 01/26/2020   HGBA1C 6.5 (H) 06/14/2019   HGBA1C 6.6 12/05/2017   Lab Results  Component Value Date   LDLCALC 141 (H) 09/15/2019   CREATININE 0.58 09/15/2019    Depression screen PHQ 2/9 02/23/2020 01/26/2020 01/09/2020  Decreased Interest 1 3 2   Down, Depressed, Hopeless 1 3 3   PHQ - 2 Score 2 6 5   Altered sleeping 1 1 1   Tired, decreased energy 1 3 3   Change in appetite 0 0 1  Feeling bad or failure about yourself  0 3 3  Trouble concentrating 0 3 2  Moving slowly or fidgety/restless 0 1 0  Suicidal thoughts 0 1 1  PHQ-9 Score 4 18 16   Difficult doing work/chores Somewhat difficult Extremely dIfficult -  Some recent data might be hidden    Fall Risk  06/12/2020 02/23/2020 01/26/2020 01/09/2020 10/13/2019  Falls in the past year? 0 0 0 0 0  Number falls in past yr: 0 0 0 0 0  Injury with Fall? 0 0 0 0 0  Follow up - Falls evaluation completed Falls evaluation completed Falls evaluation completed -     Allergies  Allergen Reactions  . Erythromycin Other (See Comments)    Reaction:  GI upset     Prior to Admission medications   Medication Sig Start Date End Date Taking? Authorizing Provider  ALPRAZolam Duanne Moron) 0.5 MG tablet Take 1 tablet (0.5 mg total) by mouth daily as  needed for anxiety. 01/26/20  Yes Rutherford Guys, MD  atorvastatin (LIPITOR) 20 MG tablet Take 1 tablet (20 mg total) by mouth daily. 01/26/20  Yes Rutherford Guys, MD  citalopram (CELEXA) 40 MG tablet TAKE 1 TABLET(40 MG) BY MOUTH DAILY 06/14/19  Yes Rutherford Guys, MD  cyclobenzaprine (FLEXERIL) 10 MG tablet Take 1 tablet (10 mg total) by mouth 3 (three) times daily as needed for muscle spasms. 05/13/19  Yes Rutherford Guys, MD  ibuprofen (ADVIL) 800 MG tablet Take 1 tablet (800 mg total) by mouth every 8 (eight) hours. With food for 5 days and after than take every 8 hours as needed 10/13/19  Yes Rutherford Guys, MD  lisinopril-hydrochlorothiazide (ZESTORETIC) 20-25 MG tablet Take 1 tablet by mouth daily. 06/14/19  Yes Rutherford Guys, MD  metFORMIN (GLUCOPHAGE) 500 MG tablet Take 1 tablet (500 mg total) by mouth daily with breakfast. 09/15/19  Yes Rutherford Guys, MD  Multiple Vitamin (MULTIVITAMIN WITH MINERALS) TABS tablet Take 1 tablet by mouth daily.   Yes [provider]  prazosin (MINIPRESS) 1 MG capsule TAKE 1 TO 2 CAPSULES(1 TO 2 MG) BY MOUTH AT BEDTIME 01/28/20  Yes Rutherford Guys, MD  traZODone (DESYREL) 50 MG tablet TAKE 1 TO 2 TABLETS(50 TO 100 MG)  BY MOUTH AT BEDTIME 05/17/20  Yes Rutherford Guys, MD  triamcinolone (NASACORT) 55 MCG/ACT AERO nasal inhaler Place 2 sprays into the nose daily. 01/26/20  Yes Rutherford Guys, MD    Past Medical History:  Diagnosis Date  . Allergic rhinitis   . Anxiety   . Asthma    after URI  . Depression   . GERD (gastroesophageal reflux disease)    takes TUMS  . HTN (hypertension)   . Hyperlipidemia   . Hyperparathyroidism ([place])   . Pneumonia     Past Surgical History:  Procedure Laterality Date  . CERVICAL CONE BIOPSY    . CESAREAN SECTION  2005  . HYSTEROSCOPY WITH NOVASURE  11/26/2012   Procedure: HYSTEROSCOPY WITH NOVASURE;  Surgeon: Marvene Staff, MD;  Location: [facility];  Service: Gynecology;  Laterality: N/A;    . LAPAROSCOPIC LYSIS OF ADHESIONS N/A 11/21/2016   Procedure: EXTENSIVE LAPAROSCOPIC LYSIS OF ADHESIONS;  Surgeon: Servando Salina, MD;  Location: [facility];  Service: Gynecology;  Laterality: N/A;  . LAPAROSCOPY N/A 11/21/2016   Procedure: LAPAROSCOPY DIAGNOSTIC, LAPAROSCOPIC REMOVAL OF FOREIGN BODY;  Surgeon: Ralene Ok, MD;  Location: [facility];  Service: General;  Laterality: N/A;  . PARATHYROIDECTOMY  1999   benign  . REPAIR VAGINAL CUFF N/A 12/14/2016   Procedure: REPAIR VAGINAL CUFF;  Surgeon: Brien Few, MD;  Location: [facility];  Service: Gynecology;  Laterality: N/A;  . ROBOTIC ASSISTED LAPAROSCOPIC OVARIAN CYSTECTOMY Right 10/01/2015   Procedure: ROBOTIC ASSISTED LAPAROSCOPIC BILATERAL SALPINGECTOMY WITH LYSIS OF ADHESIONS AND RIGHT ADNEXAL CYST;  Surgeon: Princess Bruins, MD;  Location: [facility];  Service: Gynecology;  Laterality: Right;  POSSIBLE robot .  DO NOT DRAPE THE ROBOT.....she will use the laparoscopic camera to start.    . ROBOTIC ASSISTED TOTAL HYSTERECTOMY WITH SALPINGECTOMY N/A 11/21/2016   Procedure: ROBOTIC ASSISTED TOTAL HYSTERECTOMY WITH  EXTENSIVE LYSIS OF ADHESIONS;  Surgeon: Servando Salina, MD;  Location: [facility];  Service: Gynecology;  Laterality: N/A;  . TUBAL LIGATION    . UMBILICAL HERNIA REPAIR    . WISDOM TOOTH EXTRACTION      Social History   Tobacco Use  . Smoking status: Never Smoker  . Smokeless tobacco: Never Used  Substance Use Topics  . Alcohol use: Yes    Alcohol/week: 4.0 standard drinks    Types: 4 Standard drinks or equivalent per week    Comment: occasionally    Family History  Problem Relation Age of Onset  . Depression Mother   . Hypertension Father   . Aneurysm Father 27       cerebral  . Diabetes Maternal Grandmother   . Diabetes Maternal Grandfather   . Stroke Paternal Grandfather 59    Review of Systems  Constitutional: Negative for chills and fever.  Respiratory: Negative for cough and shortness of breath.    Cardiovascular: Negative for chest pain, palpitations and leg swelling.  Gastrointestinal: Negative for abdominal pain, nausea and vomiting.     OBJECTIVE:  Today's Vitals   06/12/20 0937  BP: 138/80  Pulse: 100  Temp: 98 F (36.7 C)  SpO2: 95%  Weight: 196 lb 12.8 oz (89.3 kg)  Height: 5' 7"  (1.702 m)   Body mass index is 30.82 kg/m.  Wt Readings from Last 3 Encounters:  06/12/20 196 lb 12.8 oz (89.3 kg)  02/23/20 195 lb (88.5 kg)  01/26/20 194 lb (88 kg)    Physical Exam Vitals and nursing note reviewed.  Constitutional:  Appearance: She is well-developed.  HENT:     Head: Normocephalic and atraumatic.     Mouth/Throat:     Pharynx: No oropharyngeal exudate.  Eyes:     General: No scleral icterus.    Conjunctiva/sclera: Conjunctivae normal.     Pupils: Pupils are equal, round, and reactive to light.  Cardiovascular:     Rate and Rhythm: Normal rate and regular rhythm.     Heart sounds: Normal heart sounds. No murmur heard.  No friction rub. No gallop.   Pulmonary:     Effort: Pulmonary effort is normal.     Breath sounds: Normal breath sounds. No wheezing or rales.  Musculoskeletal:     Cervical back: Neck supple.  Skin:    General: Skin is warm and dry.  Neurological:     Mental Status: She is alert and oriented to person, place, and time.     No results found for this or any previous visit (from the past 24 hour(s)).  No results found.   ASSESSMENT and PLAN  1. Essential hypertension Controlled. Continue current regime.   2. Pure hypercholesterolemia Continue working on LFM. Checking labs, medications will be adjusted as needed.  - Comprehensive metabolic panel; Future - Lipid panel; Future  3. Controlled type 2 diabetes mellitus without complication, without long-term current use of insulin ([place]) Continue working on LFM. Checking labs, medications will be adjusted as needed.  - Hemoglobin A1c; Future  4. GAD (generalized anxiety  disorder) Controlled. Continue current regime.   Other orders - ibuprofen (ADVIL) 800 MG tablet; Take 1 tablet (800 mg total) by mouth every 8 (eight) hours as needed for moderate pain. Take with food  Return for fasting labs end of aug, followup end feb 2021.    Rutherford Guys, MD Primary Care at [place], [place] [address] Ph.  [phone] Fax [phone]

## 2020-06-21 ENCOUNTER — Ambulatory Visit (INDEPENDENT_AMBULATORY_CARE_PROVIDER_SITE_OTHER): Payer: BC Managed Care – PPO | Admitting: Psychology

## 2020-06-21 DIAGNOSIS — F4323 Adjustment disorder with mixed anxiety and depressed mood: Secondary | ICD-10-CM | POA: Diagnosis not present

## 2020-07-02 ENCOUNTER — Ambulatory Visit: Payer: BC Managed Care – PPO | Admitting: Psychology

## 2020-07-25 LAB — HM MAMMOGRAPHY

## 2020-07-30 ENCOUNTER — Other Ambulatory Visit: Payer: Self-pay | Admitting: Family Medicine

## 2020-07-30 ENCOUNTER — Ambulatory Visit: Payer: BC Managed Care – PPO

## 2020-08-01 ENCOUNTER — Other Ambulatory Visit: Payer: Self-pay | Admitting: Family Medicine

## 2020-08-01 NOTE — Telephone Encounter (Signed)
Requested medication (s) are due for refill today:no  Requested medication (s) are on the active medication list: yes   Last refill:  05/17/2020  Future visit scheduled: yes  Notes to clinic:  This refill cannot be delegated   Requested Prescriptions  Pending Prescriptions Disp Refills   ALPRAZolam (XANAX) 0.5 MG tablet [Pharmacy Med Name: ALPRAZOLAM 0.5MG TABLETS] 30 tablet     Sig: TAKE 1 TABLET(0.5 MG) BY MOUTH DAILY AS NEEDED FOR ANXIETY      Not Delegated - Psychiatry:  Anxiolytics/Hypnotics Failed - 08/01/2020 11:11 AM      Failed - This refill cannot be delegated      Failed - Urine Drug Screen completed in last 360 days.      Passed - Valid encounter within last 6 months    Recent Outpatient Visits           1 month ago Essential hypertension   [facility], Lilia Argue, MD   5 months ago GAD (generalized anxiety disorder)   [facility], Lilia Argue, MD   6 months ago GAD (generalized anxiety disorder)   [facility], Lilia Argue, MD   6 months ago Abrasion of left cornea, initial encounter   Primary Care at [place], NP   9 months ago GAD (generalized anxiety disorder)   [facility], Lilia Argue, MD

## 2020-08-01 NOTE — Telephone Encounter (Signed)
Patient is requesting a refill of the following medications: Requested Prescriptions   Pending Prescriptions Disp Refills   ALPRAZolam (XANAX) 0.5 MG tablet [Pharmacy Med Name: ALPRAZOLAM 0.5MG TABLETS] 30 tablet     Sig: TAKE 1 TABLET(0.5 MG) BY MOUTH DAILY AS NEEDED FOR ANXIETY    Date of patient request: 08/01/20 Last office visit: 06/12/20 Date of last refill: 01/26/20 Last refill amount: 30 x2 Follow up time period per chart: 08/02/20

## 2020-08-02 ENCOUNTER — Other Ambulatory Visit: Payer: Self-pay

## 2020-08-02 ENCOUNTER — Ambulatory Visit (INDEPENDENT_AMBULATORY_CARE_PROVIDER_SITE_OTHER): Payer: BC Managed Care – PPO | Admitting: Family Medicine

## 2020-08-02 DIAGNOSIS — E119 Type 2 diabetes mellitus without complications: Secondary | ICD-10-CM

## 2020-08-02 DIAGNOSIS — E7800 Pure hypercholesterolemia, unspecified: Secondary | ICD-10-CM

## 2020-08-02 LAB — COMPREHENSIVE METABOLIC PANEL
ALT: 13 IU/L (ref 0–32)
AST: 12 IU/L (ref 0–40)
Albumin/Globulin Ratio: 1.9 (ref 1.2–2.2)
Albumin: 4.7 g/dL (ref 3.8–4.8)
Alkaline Phosphatase: 53 IU/L (ref 48–121)
BUN/Creatinine Ratio: 14 (ref 9–23)
BUN: 8 mg/dL (ref 6–24)
Bilirubin Total: 0.5 mg/dL (ref 0.0–1.2)
CO2: 20 mmol/L (ref 20–29)
Calcium: 10.2 mg/dL (ref 8.7–10.2)
Chloride: 100 mmol/L (ref 96–106)
Creatinine, Ser: 0.58 mg/dL (ref 0.57–1.00)
GFR calc Af Amer: 128 mL/min/{1.73_m2} (ref >59)
GFR calc non Af Amer: 111 mL/min/{1.73_m2} (ref >59)
Globulin, Total: 2.5 g/dL (ref 1.5–4.5)
Glucose: 165 mg/dL — ABNORMAL HIGH (ref 65–99)
Potassium: 4.1 mmol/L (ref 3.5–5.2)
Sodium: 137 mmol/L (ref 134–144)
Total Protein: 7.2 g/dL (ref 6.0–8.5)

## 2020-08-02 LAB — HEMOGLOBIN A1C
Est. average glucose Bld gHb Est-mCnc: 143 mg/dL
Hgb A1c MFr Bld: 6.6 % — ABNORMAL HIGH (ref 4.8–5.6)

## 2020-08-02 LAB — LIPID PANEL
Chol/HDL Ratio: 4.1 ratio (ref 0.0–4.4)
Cholesterol, Total: 219 mg/dL — ABNORMAL HIGH (ref 100–199)
HDL: 54 mg/dL (ref >39)
LDL Chol Calc (NIH): 129 mg/dL — ABNORMAL HIGH (ref 0–99)
Triglycerides: 204 mg/dL — ABNORMAL HIGH (ref 0–149)
VLDL Cholesterol Cal: 36 mg/dL (ref 5–40)

## 2020-08-02 NOTE — Telephone Encounter (Signed)
pmp reviewd, appropriate meds refilled

## 2020-09-24 ENCOUNTER — Ambulatory Visit (INDEPENDENT_AMBULATORY_CARE_PROVIDER_SITE_OTHER): Payer: BC Managed Care – PPO | Admitting: Psychology

## 2020-09-24 DIAGNOSIS — F4323 Adjustment disorder with mixed anxiety and depressed mood: Secondary | ICD-10-CM | POA: Diagnosis not present

## 2020-10-04 ENCOUNTER — Ambulatory Visit: Payer: BC Managed Care – PPO | Admitting: Psychology

## 2020-10-19 ENCOUNTER — Ambulatory Visit: Payer: BC Managed Care – PPO | Admitting: Family Medicine

## 2020-10-19 ENCOUNTER — Encounter: Payer: Self-pay | Admitting: Family Medicine

## 2020-10-19 ENCOUNTER — Other Ambulatory Visit: Payer: Self-pay

## 2020-10-19 VITALS — BP 135/92 | HR 84 | Temp 97.8°F | Resp 16 | Ht 67.0 in | Wt 196.8 lb

## 2020-10-19 DIAGNOSIS — F411 Generalized anxiety disorder: Secondary | ICD-10-CM | POA: Diagnosis not present

## 2020-10-19 DIAGNOSIS — Z23 Encounter for immunization: Secondary | ICD-10-CM

## 2020-10-19 DIAGNOSIS — E7800 Pure hypercholesterolemia, unspecified: Secondary | ICD-10-CM

## 2020-10-19 DIAGNOSIS — K649 Unspecified hemorrhoids: Secondary | ICD-10-CM

## 2020-10-19 DIAGNOSIS — K602 Anal fissure, unspecified: Secondary | ICD-10-CM | POA: Diagnosis not present

## 2020-10-19 MED ORDER — ATORVASTATIN CALCIUM 20 MG PO TABS: 20.0000 mg | ORAL_TABLET | Freq: Every day | ORAL | 3 refills | Status: DC

## 2020-10-19 MED ORDER — HYDROCORT-PRAMOXINE (PERIANAL) 2.5-1 % EX CREA: 1.0000 "application " | TOPICAL_CREAM | Freq: Three times a day (TID) | CUTANEOUS | 1 refills | Status: DC

## 2020-10-19 MED ORDER — ALPRAZOLAM 0.5 MG PO TABS: ORAL_TABLET | ORAL | 0 refills | Status: DC

## 2020-10-19 NOTE — Progress Notes (Signed)
Patient ID: Stacy Hull, female    DOB: 12/09/1970  Age: 48 y.o. MRN: [id number]  Chief Complaint  Patient presents with  . Hemorrhoids    pt has struggled with hemorrhoids since birth of son 72 years old, pt reports new spot that is now painful esspecially with BM despite stool softeners has had to had thrombosd (?)one removed many years ago     Subjective:   Patient been having a lot of severe problem with her hemorrhoids the past few days.  It started with a hard stool last week.  She can hardly stand in the classroom or sit down because of the discomfort.  Some OTC preparations.  She has history of hemorrhoids for the last 16 years since the birth of her child.  She thinks she can feel something from internal when she is seated down on the toilet.  She has had a lot of bleeding with it.  She has never had a colonoscopy.  She has a history of generalized anxiety disorder and would like a refill on her this amounts to 2 or 3 times a week.  She needs her regular doctor again to prescribe this for her.  I explained that we will have to get her lined up with somebody for regular use of this but I will 4 months.  There was a misunderstanding when she was given the Lipitor before and she never took it, would like it represcribed.    Current allergies, medications, problem list, past/family and social histories reviewed.  Objective:  BP (!) 135/92   Pulse 84   Temp 97.8 F (36.6 C) (Temporal)   Resp 16   Ht 5' 7"  (1.702 m)   Wt 196 lb 12.8 oz (89.3 kg)   LMP 11/03/2016 (Exact Date)   SpO2 96%   BMI 30.82 kg/m  Obvious distress today with the discomfort.  He has large external hemorrhoidal tags, 3 of them I believe.  No gross external hemorrhoid is visible.  She is very tender at about the 6 or 7 o'clock position where I believe she has a fissure though I cannot get a great view due to the pain it is hard to even separate her cheeks.  A digital exam was attempted and the base of a  hemorrhoidal vein can be palpated there also. Assessment & Plan:   Assessment: 1. Hemorrhoids, unspecified hemorrhoid type   2. Anal fissure   3. GAD (generalized anxiety disorder)   4. Pure hypercholesterolemia       Plan: See instructions.  Called in the prescription for nitroglycerin with lidocaine [facility].  The other medicines will go to her regular pharmacy.  Orders Placed This Encounter  Procedures  . Flu Vaccine QUAD 6+ mos PF IM (Fluarix Quad PF)    Meds ordered this encounter  Medications  . ALPRAZolam (XANAX) 0.5 MG tablet    Sig: TAKE 1 TABLET(0.5 MG) BY MOUTH DAILY AS NEEDED FOR ANXIETY    Dispense:  30 tablet    Refill:  0  . atorvastatin (LIPITOR) 20 MG tablet    Sig: Take 1 tablet (20 mg total) by mouth daily.    Dispense:  90 tablet    Refill:  3  . hydrocortisone-pramoxine (ANALPRAM HC) 2.5-1 % rectal cream    Sig: Place 1 application rectally 3 (three) times daily.    Dispense:  30 g    Refill:  1         Patient  Instructions    Use sitz baths, soaking your bottom in the tub with your cheeks spread apart once or twice a day.  Clean well after bowel movements  Continue using a stool softener such as Colace  Use the alprazolam as needed, do not use excessively  Use the nitroglycerin and lidocaine gel for the fissure as directed 2-4 times daily  Use the Analpram cream for the hemorrhoids 2-4 times daily  I do recommend you see a gastroenterologist for a colonoscopy sometime in the not too distant future  Follow-up in 3 to 4 months for the lipids  Return sooner if needed   If you have lab work done today you will be contacted with your lab results within the next 2 weeks.  If you have not heard from [place] then please contact us. The fastest way to get your results is to register for My Chart.   IF you received an x-ray today, you will receive an invoice from [facility]. Please contact [facility]  Radiology at [phone] with questions or concerns regarding your invoice.   IF you received labwork today, you will receive an invoice from [place] [facility]. Please contact [facility] at [phone] with questions or concerns regarding your invoice.   Our billing staff will not be able to assist you with questions regarding bills from these companies.  You will be contacted with the lab results as soon as they are available. The fastest way to get your results is to activate your My Chart account. Instructions are located on the last page of this paperwork. If you have not heard from [place] regarding the results in 2 weeks, please contact this office.        Return in about 3 months (around 01/19/2021), or Need to establish with a new primary care.   Ruben Reason, MD 10/19/2020

## 2020-10-19 NOTE — Patient Instructions (Addendum)
  Use sitz baths, soaking your bottom in the tub with your cheeks spread apart once or twice a day.  Clean well after bowel movements  Continue using a stool softener such as Colace  Use the alprazolam as needed, do not use excessively  Use the nitroglycerin and lidocaine gel for the fissure as directed 2-4 times daily  Use the Analpram cream for the hemorrhoids 2-4 times daily  I do recommend you see a gastroenterologist for a colonoscopy sometime in the not too distant future  Follow-up in 3 to 4 months for the lipids  Return sooner if needed   If you have lab work done today you will be contacted with your lab results within the next 2 weeks.  If you have not heard from [place] then please contact us. The fastest way to get your results is to register for My Chart.   IF you received an x-ray today, you will receive an invoice from [facility] Radiology. Please contact [facility] Radiology at [phone] with questions or concerns regarding your invoice.   IF you received labwork today, you will receive an invoice from [place]. Please contact [facility] at [phone] with questions or concerns regarding your invoice.   Our billing staff will not be able to assist you with questions regarding bills from these companies.  You will be contacted with the lab results as soon as they are available. The fastest way to get your results is to activate your My Chart account. Instructions are located on the last page of this paperwork. If you have not heard from [place] regarding the results in 2 weeks, please contact this office.

## 2020-10-30 ENCOUNTER — Other Ambulatory Visit: Payer: Self-pay | Admitting: Emergency Medicine

## 2020-10-30 DIAGNOSIS — F430 Acute stress reaction: Secondary | ICD-10-CM

## 2020-10-30 MED ORDER — CITALOPRAM HYDROBROMIDE 40 MG PO TABS: ORAL_TABLET | ORAL | 0 refills | Status: DC

## 2021-01-01 ENCOUNTER — Other Ambulatory Visit: Payer: Self-pay | Admitting: Family Medicine

## 2021-01-14 ENCOUNTER — Ambulatory Visit: Payer: BC Managed Care – PPO | Admitting: Family Medicine

## 2021-01-14 ENCOUNTER — Encounter: Payer: Self-pay | Admitting: Family Medicine

## 2021-01-14 ENCOUNTER — Other Ambulatory Visit: Payer: Self-pay

## 2021-01-14 VITALS — BP 164/100 | HR 94 | Temp 97.9°F | Ht 67.0 in | Wt 199.9 lb

## 2021-01-14 DIAGNOSIS — I10 Essential (primary) hypertension: Secondary | ICD-10-CM

## 2021-01-14 DIAGNOSIS — B029 Zoster without complications: Secondary | ICD-10-CM

## 2021-01-14 MED ORDER — IBUPROFEN 800 MG PO TABS: 800.0000 mg | ORAL_TABLET | Freq: Three times a day (TID) | ORAL | 2 refills | Status: DC | PRN

## 2021-01-14 MED ORDER — TRIAMCINOLONE ACETONIDE 0.1 % EX CREA
1.0000 | TOPICAL_CREAM | Freq: Two times a day (BID) | CUTANEOUS | 0 refills | Status: DC
Start: 2021-01-14 — End: 2021-04-23

## 2021-01-14 MED ORDER — VALACYCLOVIR HCL 1 G PO TABS: 1000.0000 mg | ORAL_TABLET | Freq: Three times a day (TID) | ORAL | 0 refills | Status: AC

## 2021-01-14 NOTE — Progress Notes (Signed)
2/14/202212:39 PM  IVAH GIRARDOT 1971-04-22, 50 y.o., female [id number]  Chief Complaint  Patient presents with  . possible shingles outbreak    Right side, started sat. Itchy and painful     HPI:   Patient is a 50 y.o. female with past medical history significant for GAD, insomnia, HTN,HLP,DM2who presents today for rash.  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 On Friday noticed a painful patch to right lower flank Has now notice increased itching Has not tried anything for the rash, pain or itching Took Ibuprofen x1 yesterday Feels like the rash is better but the itching and pain is worse   HTN Home BP 135-140/ 90's Denies issues with medication compliance Discussed would need to add additional medications to bring to goal BP goal< 130/80 She declines adding medications at this time Will follow up at the end of the week BP Readings from Last 3 Encounters:  01/14/21 (!) 164/100  10/19/20 (!) 135/92  06/12/20 138/80     Depression screen PHQ 2/9 01/14/2021 10/19/2020 02/23/2020  Decreased Interest 0 0 1  Down, Depressed, Hopeless 0 0 1  PHQ - 2 Score 0 0 2  Altered sleeping - - 1  Tired, decreased energy - - 1  Change in appetite - - 0  Feeling bad or failure about yourself  - - 0  Trouble concentrating - - 0  Moving slowly or fidgety/restless - - 0  Suicidal thoughts - - 0  PHQ-9 Score - - 4  Difficult doing work/chores - - Somewhat difficult  Some recent data might be hidden    Fall Risk  01/14/2021 10/19/2020 06/12/2020 02/23/2020 01/26/2020  Falls in the past year? 0 0 0 0 0  Number falls in past yr: 0 0 0 0 0  Injury with Fall? 0 0 0 0 0  Risk for fall due to : - No Fall Risks - - -  Follow up Falls evaluation completed Falls evaluation completed - Falls evaluation completed Falls evaluation completed     Allergies  Allergen Reactions  . Erythromycin Other (See Comments)    Reaction:  GI upset     Prior  to Admission medications   Medication Sig Start Date End Date Taking? Authorizing Provider  ALPRAZolam Duanne Moron) 0.5 MG tablet TAKE 1 TABLET(0.5 MG) BY MOUTH DAILY AS NEEDED FOR ANXIETY 10/19/20  Yes Posey Boyer, MD  atorvastatin (LIPITOR) 20 MG tablet Take 1 tablet (20 mg total) by mouth daily. 10/19/20  Yes Posey Boyer, MD  citalopram (CELEXA) 40 MG tablet Take 1 tablet (40mg ) by mouth daily 10/30/20  Yes Posey Boyer, MD  hydrocortisone-pramoxine [facility]) 2.5-1 % rectal cream Place 1 application rectally 3 (three) times daily. 10/19/20  Yes Posey Boyer, MD  ibuprofen (ADVIL) 800 MG tablet Take 1 tablet (800 mg total) by mouth every 8 (eight) hours as needed for moderate pain. Take with food 06/12/20  Yes Jacelyn Pi, Lilia Argue, MD  lisinopril-hydrochlorothiazide (ZESTORETIC) 20-25 MG tablet TAKE 1 TABLET BY MOUTH DAILY 01/01/21  Yes Wendie Agreste, MD  Multiple Vitamin (MULTIVITAMIN WITH MINERALS) TABS tablet Take 1 tablet by mouth daily.   Yes [provider]  traZODone (DESYREL) 50 MG tablet TAKE 1 TO 2 TABLETS(50 TO 100 MG) BY MOUTH AT BEDTIME 05/17/20  Yes Jacelyn Pi, Irma M, MD  triamcinolone (NASACORT) 55 MCG/ACT AERO nasal inhaler Place 2 sprays into the nose daily. 01/26/20  Yes Jacelyn Pi, Lilia Argue, MD    Past Medical History:  Diagnosis Date  . Allergic rhinitis   . Anxiety   . Asthma    after URI  . Depression   . GERD (gastroesophageal reflux disease)    takes TUMS  . HTN (hypertension)   . Hyperlipidemia   . Hyperparathyroidism ([place])   . Pneumonia     Past Surgical History:  Procedure Laterality Date  . CERVICAL CONE BIOPSY    . CESAREAN SECTION  2005  . HYSTEROSCOPY WITH NOVASURE  11/26/2012   Procedure: HYSTEROSCOPY WITH NOVASURE;  Surgeon: Marvene Staff, MD;  Location: [facility];  Service: Gynecology;  Laterality: N/A;  . LAPAROSCOPIC LYSIS OF ADHESIONS N/A 11/21/2016   Procedure: EXTENSIVE LAPAROSCOPIC LYSIS OF ADHESIONS;   Surgeon: Servando Salina, MD;  Location: [facility];  Service: Gynecology;  Laterality: N/A;  . LAPAROSCOPY N/A 11/21/2016   Procedure: LAPAROSCOPY DIAGNOSTIC, LAPAROSCOPIC REMOVAL OF FOREIGN BODY;  Surgeon: Ralene Ok, MD;  Location: [facility];  Service: General;  Laterality: N/A;  . PARATHYROIDECTOMY  1999   benign  . REPAIR VAGINAL CUFF N/A 12/14/2016   Procedure: REPAIR VAGINAL CUFF;  Surgeon: Brien Few, MD;  Location: [facility];  Service: Gynecology;  Laterality: N/A;  . ROBOTIC ASSISTED LAPAROSCOPIC OVARIAN CYSTECTOMY Right 10/01/2015   Procedure: ROBOTIC ASSISTED LAPAROSCOPIC BILATERAL SALPINGECTOMY WITH LYSIS OF ADHESIONS AND RIGHT ADNEXAL CYST;  Surgeon: Princess Bruins, MD;  Location: [facility];  Service: Gynecology;  Laterality: Right;  POSSIBLE robot .  DO NOT DRAPE THE ROBOT.....she will use the laparoscopic camera to start.    . ROBOTIC ASSISTED TOTAL HYSTERECTOMY WITH SALPINGECTOMY N/A 11/21/2016   Procedure: ROBOTIC ASSISTED TOTAL HYSTERECTOMY WITH  EXTENSIVE LYSIS OF ADHESIONS;  Surgeon: Servando Salina, MD;  Location: [facility];  Service: Gynecology;  Laterality: N/A;  . TUBAL LIGATION    . UMBILICAL HERNIA REPAIR    . WISDOM TOOTH EXTRACTION      Social History   Tobacco Use  . Smoking status: Never Smoker  . Smokeless tobacco: Never Used  Substance Use Topics  . Alcohol use: Yes    Alcohol/week: 4.0 standard drinks    Types: 4 Standard drinks or equivalent per week    Comment: occasionally    Family History  Problem Relation Age of Onset  . Depression Mother   . Hypertension Father   . Aneurysm Father 36       cerebral  . Diabetes Maternal Grandmother   . Diabetes Maternal Grandfather   . Stroke Paternal Grandfather 45    Review of Systems  Constitutional: Negative for chills, fever and malaise/fatigue.  Respiratory: Negative for cough, shortness of breath and wheezing.   Cardiovascular: Negative for chest pain, palpitations and leg swelling.   Gastrointestinal: Negative for abdominal pain, heartburn, nausea and vomiting.  Musculoskeletal: Negative for back pain and joint pain.  Skin: Positive for itching and rash.  Neurological: Negative for dizziness, tingling, tremors, sensory change, focal weakness, weakness and headaches.     OBJECTIVE:  Today's Vitals   01/14/21 1141  BP: (!) 164/100  Pulse: 94  Temp: 97.9 F (36.6 C)  TempSrc: Temporal  SpO2: 97%  Weight: 199 lb 14.4 oz (90.7 kg)  Height: 5\' 7"  (1.702 m)   Body mass index is 31.31 kg/m.   Physical Exam Constitutional:      General: She is not in acute distress.    Appearance: Normal appearance. She is not ill-appearing.  HENT:     Head: Normocephalic.  Cardiovascular:     Rate and Rhythm: Normal rate and regular rhythm.     Pulses: Normal pulses.     Heart sounds: Normal heart sounds. No murmur heard. No friction rub. No gallop.   Pulmonary:     Effort: Pulmonary effort is normal. No respiratory distress.     Breath sounds: Normal breath sounds. No stridor. No wheezing, rhonchi or rales.  Abdominal:     General: Bowel sounds are normal.     Palpations: Abdomen is soft.     Tenderness: There is no abdominal tenderness.  Musculoskeletal:     Right lower leg: No edema.     Left lower leg: No edema.  Skin:    General: Skin is warm and dry.     Findings: Rash (red scaly lesion to right lower flank) present.  Neurological:     General: No focal deficit present.     Mental Status: She is alert and oriented to person, place, and time. Mental status is at baseline.     Sensory: No sensory deficit.     Motor: No weakness.  Psychiatric:        Mood and Affect: Mood normal.        Behavior: Behavior normal.     No results found for this or any previous visit (from the past 24 hour(s)).  No results found.   ASSESSMENT and PLAN  Problem List Items Addressed This Visit      Cardiovascular and Mediastinum   Essential hypertension    Other  Visit Diagnoses    Herpes zoster without complication    -  Primary   Relevant Medications   valACYclovir (VALTREX) 1000 MG tablet   triamcinolone (KENALOG) 0.1 %   ibuprofen (ADVIL) 800 MG tablet      Plan . Treat potential shingles: Valacyclovir tid x 7 days  Pain: Tylenol Ibuprofen  Itching: Kenalog to pharmacy and calamine lotion  Work note provided  Will follow up on BP discussed r/se/b declined medication changes at this time   Return if symptoms worsen or fail to improve, for Next scheduled follow up.Huston Foley Reyansh Kushnir, FNP-BC Primary Care at [address], [place] [address] Ph.  [phone] Fax [phone]

## 2021-01-14 NOTE — Patient Instructions (Addendum)
Pain: Tylenol Ibuprofen  Itching: Kenalog to pharmacy and calamine lotion   Shingles  Shingles is an infection. It gives you a painful skin rash and blisters that have fluid in them. Shingles is caused by the same germ (virus) that causes chickenpox. Shingles only happens in people who:  Have had chickenpox.  Have been given a shot of medicine (vaccine) to protect against chickenpox. Shingles is rare in this group. The first symptoms of shingles may be itching, tingling, or pain in an area on your skin. A rash will show on your skin a few days or weeks later. The rash is likely to be on one side of your body. The rash usually has a shape like a belt or a band. Over time, the rash turns into fluid-filled blisters. The blisters will break open, change into scabs, and dry up. Medicines may:  Help with pain and itching.  Help you get better sooner.  Help to prevent long-term problems. Follow these instructions at home: Medicines  Take over-the-counter and prescription medicines only as told by your doctor.  Put on an anti-itch cream or numbing cream where you have a rash, blisters, or scabs. Do this as told by your doctor. Helping with itching and discomfort  Put cold, wet cloths (cold compresses) on the area of the rash or blisters as told by your doctor.  Cool baths can help you feel better. Try adding baking soda or dry oatmeal to the water to lessen itching. Do not bathe in hot water.   Blister and rash care  Keep your rash covered with a loose bandage (dressing).  Wear loose clothing that does not rub on your rash.  Keep your rash and blisters clean. To do this, wash the area with mild soap and cool water as told by your doctor.  Check your rash every day for signs of infection. Check for: ? More redness, swelling, or pain. ? Fluid or blood. ? Warmth. ? Pus or a bad smell.  Do not scratch your rash. Do not pick at your blisters. To help you to not scratch: ? Keep  your fingernails clean and cut short. ? Wear gloves or mittens when you sleep, if scratching is a problem. General instructions  Rest as told by your doctor.  Keep all follow-up visits as told by your doctor. This is important.  Wash your hands often with soap and water. If soap and water are not available, use hand sanitizer. Doing this lowers your chance of getting a skin infection caused by germs (bacteria).  Your infection can cause chickenpox in people who have never had chickenpox or never got a shot of chickenpox vaccine. If you have blisters that did not change into scabs yet, try not to touch other people or be around other people, especially: ? Babies. ? Pregnant women. ? Children who have areas of red, itchy, or rough skin (eczema). ? Very old people who have transplants. ? People who have a long-term (chronic) sickness, like cancer or AIDS. Contact a doctor if:  Your pain does not get better with medicine.  Your pain does not get better after the rash heals.  You have any signs of infection in the rash area. These signs include: ? More redness, swelling, or pain around the rash. ? Fluid or blood coming from the rash. ? The rash area feeling warm to the touch. ? Pus or a bad smell coming from the rash. Get help right away if:  The rash is  on your face or nose.  You have pain in your face or pain by your eye.  You lose feeling on one side of your face.  You have trouble seeing.  You have ear pain, or you have ringing in your ear.  You have a loss of taste.  Your condition gets worse. Summary  Shingles gives you a painful skin rash and blisters that have fluid in them.  Shingles is an infection. It is caused by the same germ (virus) that causes chickenpox.  Keep your rash covered with a loose bandage (dressing). Wear loose clothing that does not rub on your rash.  If you have blisters that did not change into scabs yet, try not to touch other people or be  around people. This information is not intended to replace advice given to you by your health care provider. Make sure you discuss any questions you have with your health care provider. Document Revised: 03/11/2019 Document Reviewed: 07/22/2017 Elsevier Patient Education  2021 [employer].   If you have lab work done today you will be contacted with your lab results within the next 2 weeks.  If you have not heard from [place] then please contact us. The fastest way to get your results is to register for My Chart.   IF you received an x-ray today, you will receive an invoice from [facility]. Please contact [facility] Radiology at [phone] with questions or concerns regarding your invoice.   IF you received labwork today, you will receive an invoice from [facility]. Please contact [facility] at [phone] with questions or concerns regarding your invoice.   Our billing staff will not be able to assist you with questions regarding bills from these companies.  You will be contacted with the lab results as soon as they are available. The fastest way to get your results is to activate your My Chart account. Instructions are located on the last page of this paperwork. If you have not heard from [place] regarding the results in 2 weeks, please contact this office.

## 2021-01-18 ENCOUNTER — Other Ambulatory Visit: Payer: Self-pay

## 2021-01-18 ENCOUNTER — Ambulatory Visit: Payer: BC Managed Care – PPO | Admitting: Family Medicine

## 2021-01-18 VITALS — BP 132/89 | HR 100 | Temp 98.4°F | Ht 67.0 in | Wt 199.0 lb

## 2021-01-18 DIAGNOSIS — I10 Essential (primary) hypertension: Secondary | ICD-10-CM | POA: Diagnosis not present

## 2021-01-18 DIAGNOSIS — E119 Type 2 diabetes mellitus without complications: Secondary | ICD-10-CM | POA: Diagnosis not present

## 2021-01-18 DIAGNOSIS — F411 Generalized anxiety disorder: Secondary | ICD-10-CM | POA: Diagnosis not present

## 2021-01-18 DIAGNOSIS — E559 Vitamin D deficiency, unspecified: Secondary | ICD-10-CM

## 2021-01-18 DIAGNOSIS — F430 Acute stress reaction: Secondary | ICD-10-CM

## 2021-01-18 DIAGNOSIS — F331 Major depressive disorder, recurrent, moderate: Secondary | ICD-10-CM

## 2021-01-18 DIAGNOSIS — E7800 Pure hypercholesterolemia, unspecified: Secondary | ICD-10-CM

## 2021-01-18 MED ORDER — LISINOPRIL-HYDROCHLOROTHIAZIDE 20-25 MG PO TABS: 1.0000 | ORAL_TABLET | Freq: Every day | ORAL | 2 refills | Status: DC

## 2021-01-18 MED ORDER — CITALOPRAM HYDROBROMIDE 40 MG PO TABS: ORAL_TABLET | ORAL | 2 refills | Status: DC

## 2021-01-18 MED ORDER — ALPRAZOLAM 0.5 MG PO TABS: ORAL_TABLET | ORAL | 0 refills | Status: DC

## 2021-01-18 MED ORDER — TRAZODONE HCL 50 MG PO TABS: ORAL_TABLET | ORAL | 2 refills | Status: DC

## 2021-01-18 NOTE — Patient Instructions (Addendum)
I do recommend meeting with therapist. Let me know a note for work or FMLA to allow time for your care. Talk to HR about how to go about getting the time for your care and let me know how I can help. See info on stress management below, but call therapist to discuss as well.  Try to incorporate some form of activity/low intensity exercise daily.   No med changes for now. I will check some labs. Thanks for coming in today, recheck in 3 months, but let me know if there are questions sooner.   Textbook of family medicine (9th ed., pp. 0086-7619). [place], [place]: [employer].">  Stress, Adult Stress is a normal reaction to life events. Stress is what you feel when life demands more than you are used to, or more than you think you can handle. Some stress can be useful, such as studying for a test or meeting a deadline at work. Stress that occurs too often or for too long can cause problems. It can affect your emotional health and interfere with relationships and normal daily activities. Too much stress can weaken your body's defense system (immune system) and increase your risk for physical illness. If you already have a medical problem, stress can make it worse. What are the causes? All sorts of life events can cause stress. An event that causes stress for one person may not be stressful for another person. Major life events, whether positive or negative, commonly cause stress. Examples include:  Losing a job or starting a new job.  Losing a loved one.  Moving to a new town or home.  Getting married or divorced.  Having a baby.  Getting injured or sick. Less obvious life events can also cause stress, especially if they occur day after day or in combination with each other. Examples include:  Working long hours.  Driving in traffic.  Caring for children.  Being in debt.  Being in a difficult relationship. What are the signs or symptoms? Stress can cause emotional symptoms,  including:  Anxiety. This is feeling worried, afraid, on edge, overwhelmed, or out of control.  Anger, including irritation or impatience.  Depression. This is feeling sad, down, helpless, or guilty.  Trouble focusing, remembering, or making decisions. Stress can cause physical symptoms, including:  Aches and pains. These may affect your head, neck, back, stomach, or other areas of your body.  Tight muscles or a clenched jaw.  Low energy.  Trouble sleeping. Stress can cause unhealthy behaviors, including:  Eating to feel better (overeating) or skipping meals.  Working too much or putting off tasks.  Smoking, drinking alcohol, or using drugs to feel better. How is this diagnosed? Stress is diagnosed through an assessment by your health care provider. He or she may diagnose this condition based on:  Your symptoms and any stressful life events.  Your medical history.  Tests to rule out other causes of your symptoms. Depending on your condition, your health care provider may refer you to a specialist for further evaluation. How is this treated? Stress management techniques are the recommended treatment for stress. Medicine is not typically recommended for the treatment of stress. Techniques to reduce your reaction to stressful life events include:  Stress identification. Monitor yourself for symptoms of stress and identify what causes stress for you. These skills may help you to avoid or prepare for stressful events.  Time management. Set your priorities, keep a calendar of events, and learn to say no. Taking these actions can  help you avoid making too many commitments. Techniques for coping with stress include:  Rethinking the problem. Try to think realistically about stressful events rather than ignoring them or overreacting. Try to find the positives in a stressful situation rather than focusing on the negatives.  Exercise. Physical exercise can release both physical and  emotional tension. The key is to find a form of exercise that you enjoy and do it regularly.  Relaxation techniques. These relax the body and mind. The key is to find one or more that you enjoy and use the techniques regularly. Examples include: ? Meditation, deep breathing, or progressive relaxation techniques. ? Yoga or tai chi. ? Biofeedback, mindfulness techniques, or journaling. ? Listening to music, being out in nature, or participating in other hobbies.  Practicing a healthy lifestyle. Eat a balanced diet, drink plenty of water, limit or avoid caffeine, and get plenty of sleep.  Having a strong support network. Spend time with family, friends, or other people you enjoy being around. Express your feelings and talk things over with someone you trust. Counseling or talk therapy with a mental health professional may be helpful if you are having trouble managing stress on your own.   Follow these instructions at home: Lifestyle  Avoid drugs.  Do not use any products that contain nicotine or tobacco, such as cigarettes, e-cigarettes, and chewing tobacco. If you need help quitting, ask your health care provider.  Limit alcohol intake to no more than 1 drink a day for nonpregnant women and 2 drinks a day for men. One drink equals 12 oz of beer, 5 oz of wine, or 1 oz of hard liquor  Do not use alcohol or drugs to relax.  Eat a balanced diet that includes fresh fruits and vegetables, whole grains, lean meats, fish, eggs, and beans, and low-fat dairy. Avoid processed foods and foods high in added fat, sugar, and salt.  Exercise at least 30 minutes on 5 or more days each week.  Get 7-8 hours of sleep each night.   General instructions  Practice stress management techniques as discussed with your health care provider.  Drink enough fluid to keep your urine clear or pale yellow.  Take over-the-counter and prescription medicines only as told by your health care provider.  Keep all  follow-up visits as told by your health care provider. This is important.   Contact a health care provider if:  Your symptoms get worse.  You have new symptoms.  You feel overwhelmed by your problems and can no longer manage them on your own. Get help right away if:  You have thoughts of hurting yourself or others. If you ever feel like you may hurt yourself or others, or have thoughts about taking your own life, get help right away. You can go to your nearest emergency department or call:  Your local emergency services (911 in the [place]  A suicide crisis helpline, such as the [facility] at 226 490 5363. This is open 24 hours a day. Summary  Stress is a normal reaction to life events. It can cause problems if it happens too often or for too long.  Practicing stress management techniques is the best way to treat stress.  Counseling or talk therapy with a mental health professional may be helpful if you are having trouble managing stress on your own. This information is not intended to replace advice given to you by your health care provider. Make sure you discuss any questions you have with your  health care provider. Document Revised: 08/03/2020 Document Reviewed: 08/03/2020 Elsevier Patient Education  2021 [employer].    If you have lab work done today you will be contacted with your lab results within the next 2 weeks.  If you have not heard from [place] then please contact us. The fastest way to get your results is to register for My Chart.   IF you received an x-ray today, you will receive an invoice from [facility]. Please contact [facility] at [phone] with questions or concerns regarding your invoice.   IF you received labwork today, you will receive an invoice from [facility]. Please contact [facility] at [phone] with questions or concerns regarding your invoice.   Our billing staff will not be able to assist you with  questions regarding bills from these companies.  You will be contacted with the lab results as soon as they are available. The fastest way to get your results is to activate your My Chart account. Instructions are located on the last page of this paperwork. If you have not heard from [place] regarding the results in 2 weeks, please contact this office.
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
## 2021-01-19 LAB — COMPREHENSIVE METABOLIC PANEL
ALT: 14 IU/L (ref 0–32)
AST: 17 IU/L (ref 0–40)
Albumin/Globulin Ratio: 1.6 (ref 1.2–2.2)
Albumin: 4.5 g/dL (ref 3.8–4.8)
Alkaline Phosphatase: 63 IU/L (ref 44–121)
BUN/Creatinine Ratio: 16 (ref 9–23)
BUN: 9 mg/dL (ref 6–24)
Bilirubin Total: 0.4 mg/dL (ref 0.0–1.2)
CO2: 20 mmol/L (ref 20–29)
Calcium: 9.7 mg/dL (ref 8.7–10.2)
Chloride: 97 mmol/L (ref 96–106)
Creatinine, Ser: 0.58 mg/dL (ref 0.57–1.00)
GFR calc Af Amer: 127 mL/min/{1.73_m2} (ref >59)
GFR calc non Af Amer: 110 mL/min/{1.73_m2} (ref >59)
Globulin, Total: 2.8 g/dL (ref 1.5–4.5)
Glucose: 137 mg/dL — ABNORMAL HIGH (ref 65–99)
Potassium: 4 mmol/L (ref 3.5–5.2)
Sodium: 136 mmol/L (ref 134–144)
Total Protein: 7.3 g/dL (ref 6.0–8.5)

## 2021-01-19 LAB — LIPID PANEL
Chol/HDL Ratio: 2.6 ratio (ref 0.0–4.4)
Cholesterol, Total: 150 mg/dL (ref 100–199)
HDL: 58 mg/dL (ref >39)
LDL Chol Calc (NIH): 50 mg/dL (ref 0–99)
Triglycerides: 275 mg/dL — ABNORMAL HIGH (ref 0–149)
VLDL Cholesterol Cal: 42 mg/dL — ABNORMAL HIGH (ref 5–40)

## 2021-01-19 LAB — MICROALBUMIN / CREATININE URINE RATIO
Creatinine, Urine: 61.3 mg/dL
Microalb/Creat Ratio: <5 mg/g creat (ref 0–29)
Microalbumin, Urine: <3 ug/mL

## 2021-01-19 LAB — HEMOGLOBIN A1C
Est. average glucose Bld gHb Est-mCnc: 160 mg/dL
Hgb A1c MFr Bld: 7.2 % — ABNORMAL HIGH (ref 4.8–5.6)

## 2021-01-19 LAB — VITAMIN D 25 HYDROXY (VIT D DEFICIENCY, FRACTURES): Vit D, 25-Hydroxy: 16.4 ng/mL — ABNORMAL LOW (ref 30.0–100.0)

## 2021-01-28 ENCOUNTER — Other Ambulatory Visit: Payer: Self-pay

## 2021-01-28 ENCOUNTER — Encounter: Payer: Self-pay | Admitting: Family Medicine

## 2021-01-28 MED ORDER — METFORMIN HCL 500 MG PO TABS: 500.0000 mg | ORAL_TABLET | Freq: Every day | ORAL | 1 refills | Status: DC

## 2021-01-28 MED ORDER — VITAMIN D (ERGOCALCIFEROL) 1.25 MG (50000 UNIT) PO CAPS: 50000.0000 [IU] | ORAL_CAPSULE | ORAL | 1 refills | Status: DC

## 2021-02-19 ENCOUNTER — Other Ambulatory Visit: Payer: Self-pay

## 2021-02-19 ENCOUNTER — Ambulatory Visit: Payer: BC Managed Care – PPO | Admitting: Registered Nurse

## 2021-02-19 ENCOUNTER — Encounter: Payer: Self-pay | Admitting: Registered Nurse

## 2021-02-19 VITALS — BP 152/89 | HR 100 | Temp 98.0°F | Resp 18 | Ht 67.0 in | Wt 197.0 lb

## 2021-02-19 DIAGNOSIS — K644 Residual hemorrhoidal skin tags: Secondary | ICD-10-CM

## 2021-02-19 DIAGNOSIS — K645 Perianal venous thrombosis: Secondary | ICD-10-CM | POA: Diagnosis not present

## 2021-02-19 DIAGNOSIS — K6289 Other specified diseases of anus and rectum: Secondary | ICD-10-CM

## 2021-02-19 LAB — HEMOCCULT GUIAC POC 1CARD (OFFICE): Fecal Occult Blood, POC: NEGATIVE

## 2021-02-19 MED ORDER — TRAMADOL HCL 50 MG PO TABS: 50.0000 mg | ORAL_TABLET | Freq: Three times a day (TID) | ORAL | 0 refills | Status: AC | PRN

## 2021-02-19 MED ORDER — LIDOCAINE 5 % EX OINT: 1.0000 "application " | TOPICAL_OINTMENT | CUTANEOUS | 0 refills | Status: DC | PRN

## 2021-02-19 NOTE — Progress Notes (Signed)
Acute Office Visit  Subjective:    Patient ID: Stacy Hull, female    DOB: 01/07/1971, 50 y.o.   MRN: [id number]  Chief Complaint  Patient presents with  . Hemorrhoids    Patient states she is having problems with her bowel movement where she is now shaking and about to pass out. Hard to sit and so uncomfortable.    HPI Patient is in today for hemorrhoids  Ongoing problem for past 10+ years Has had surgical intervention on rectal skin tags in the past Notes that she was sitting on bar stool on Saturday for 4-5 hours which likely caused current exacerbation.  Taking anusol applied three times daily, senna daily, not straining for BM. Has had on and off R sided abdominal pain in recent years but does not seem to be worsening recently No unexpected weight loss, no blood in the stool Does note that she has had on and off mucus and bile in stool since starting statin in November, but this doesn't seem to associate with worsening of hemorrhoids and rectal pain. No other AEs typically associated with statin use.   Past Medical History:  Diagnosis Date  . Allergic rhinitis   . Anxiety   . Asthma    after URI  . Depression   . GERD (gastroesophageal reflux disease)    takes TUMS  . HTN (hypertension)   . Hyperlipidemia   . Hyperparathyroidism ([place])   . Pneumonia     Past Surgical History:  Procedure Laterality Date  . ABDOMINAL HYSTERECTOMY N/A    [employer]rees[employer] 01/18/2021  . CERVICAL CONE BIOPSY    . CESAREAN SECTION  2005  . CESAREAN SECTION N/A    [employer]rees[employer] 01/18/2021  . HERNIA REPAIR N/A    [employer] 01/18/2021  . HYSTEROSCOPY WITH NOVASURE  11/26/2012   Procedure: HYSTEROSCOPY WITH NOVASURE;  Surgeon: Marvene Staff, MD;  Location: [facility];  Service: Gynecology;  Laterality: N/A;  . LAPAROSCOPIC LYSIS OF ADHESIONS N/A 11/21/2016   Procedure: EXTENSIVE LAPAROSCOPIC LYSIS OF ADHESIONS;  Surgeon: Servando Salina, MD;  Location: [facility];  Service: Gynecology;   Laterality: N/A;  . LAPAROSCOPY N/A 11/21/2016   Procedure: LAPAROSCOPY DIAGNOSTIC, LAPAROSCOPIC REMOVAL OF FOREIGN BODY;  Surgeon: Ralene Ok, MD;  Location: [facility];  Service: General;  Laterality: N/A;  . PARATHYROIDECTOMY  1999   benign  . REPAIR VAGINAL CUFF N/A 12/14/2016   Procedure: REPAIR VAGINAL CUFF;  Surgeon: Brien Few, MD;  Location: [facility];  Service: Gynecology;  Laterality: N/A;  . ROBOTIC ASSISTED LAPAROSCOPIC OVARIAN CYSTECTOMY Right 10/01/2015   Procedure: ROBOTIC ASSISTED LAPAROSCOPIC BILATERAL SALPINGECTOMY WITH LYSIS OF ADHESIONS AND RIGHT ADNEXAL CYST;  Surgeon: Princess Bruins, MD;  Location: [facility];  Service: Gynecology;  Laterality: Right;  POSSIBLE robot .  DO NOT DRAPE THE ROBOT.....she will use the laparoscopic camera to start.    . ROBOTIC ASSISTED TOTAL HYSTERECTOMY WITH SALPINGECTOMY N/A 11/21/2016   Procedure: ROBOTIC ASSISTED TOTAL HYSTERECTOMY WITH  EXTENSIVE LYSIS OF ADHESIONS;  Surgeon: Servando Salina, MD;  Location: [facility];  Service: Gynecology;  Laterality: N/A;  . TUBAL LIGATION    . UMBILICAL HERNIA REPAIR    . WISDOM TOOTH EXTRACTION      Family History  Problem Relation Age of Onset  . Depression Mother   . Hypertension Father   . Aneurysm Father 73       cerebral  . Diabetes Maternal Grandmother   . Diabetes Maternal Grandfather   . Stroke Paternal Grandfather 32  Social History   Socioeconomic History  . Marital status: Married    Spouse name: Leilany Digeronimo  . Number of children: 2  . Years of education: Master's +  . Highest education level: Not on file  Occupational History  . Occupation: HIGH SCHOOL ENGLISH     Employer: [employer]    Comment: [employer]  Tobacco Use  . Smoking status: Never Smoker  . Smokeless tobacco: Never Used  Vaping Use  . Vaping Use: Never used  Substance and Sexual Activity  . Alcohol use: Yes    Alcohol/week: 4.0 standard drinks    Types: 4 Standard  drinks or equivalent per week    Comment: occasionally  . Drug use: No  . Sexual activity: Yes    Partners: Male    Birth control/protection: Surgical  Other Topics Concern  . Not on file  Social History Narrative   Lives with her husband and their 2 children. Walks 3x's weekly for exercise.   Social Determinants of Health   Financial Resource Strain: Not on file  Food Insecurity: Not on file  Transportation Needs: Not on file  Physical Activity: Not on file  Stress: Not on file  Social Connections: Not on file  Intimate Partner Violence: Not on file    Outpatient Medications Prior to Visit  Medication Sig Dispense Refill  . ALPRAZolam (XANAX) 0.5 MG tablet TAKE 1 TABLET(0.5 MG) BY MOUTH DAILY AS NEEDED FOR ANXIETY 30 tablet 0  . atorvastatin (LIPITOR) 20 MG tablet Take 1 tablet (20 mg total) by mouth daily. 90 tablet 3  . citalopram (CELEXA) 40 MG tablet Take 1 tablet (31m) by mouth daily 90 tablet 2  . hydrocortisone-pramoxine (ANALPRAM HC) 2.5-1 % rectal cream Place 1 application rectally 3 (three) times daily. 30 g 1  . ibuprofen (ADVIL) 800 MG tablet Take 1 tablet (800 mg total) by mouth every 8 (eight) hours as needed for moderate pain. Take with food 60 tablet 2  . lisinopril-hydrochlorothiazide (ZESTORETIC) 20-25 MG tablet Take 1 tablet by mouth daily. 90 tablet 2  . metFORMIN (GLUCOPHAGE) 500 MG tablet Take 1 tablet (500 mg total) by mouth daily with breakfast. 90 tablet 1  . Multiple Vitamin (MULTIVITAMIN WITH MINERALS) TABS tablet Take 1 tablet by mouth daily.    . traZODone (DESYREL) 50 MG tablet TAKE 1 TO 2 TABLETS(50 TO 100 MG) BY MOUTH AT BEDTIME 90 tablet 2  . triamcinolone (KENALOG) 0.1 % Apply 1 application topically 2 (two) times daily. 30 g 0  . triamcinolone (NASACORT) 55 MCG/ACT AERO nasal inhaler Place 2 sprays into the nose daily. 1 Inhaler 12  . Vitamin D, Ergocalciferol, (DRISDOL) 1.25 MG (50000 UNIT) CAPS capsule Take 1 capsule (50,000 Units total) by  mouth every 7 (seven) days. 15 capsule 1   No facility-administered medications prior to visit.    Allergies  Allergen Reactions  . Erythromycin Other (See Comments)    Reaction:  GI upset     Review of Systems Per hpi      Objective:    Physical Exam Vitals and nursing note reviewed. Exam conducted with a chaperone present.  Constitutional:      General: She is not in acute distress.    Appearance: Normal appearance. She is not ill-appearing, toxic-appearing or diaphoretic.  Cardiovascular:     Rate and Rhythm: Normal rate and regular rhythm.     Pulses: Normal pulses.     Heart sounds: Normal heart sounds. No murmur heard.  No friction rub. No gallop.   Pulmonary:     Effort: Pulmonary effort is normal. No respiratory distress.     Breath sounds: Normal breath sounds. No stridor. No wheezing, rhonchi or rales.  Chest:     Chest wall: No tenderness.  Abdominal:     General: Abdomen is flat.     Palpations: Abdomen is rigid.     Tenderness: There is abdominal tenderness in the right upper quadrant and right lower quadrant.     Hernia: No hernia is present.  Genitourinary:    Exam position: Knee-chest position.    Skin:    General: Skin is warm and dry.     Capillary Refill: Capillary refill takes less than 2 seconds.  Neurological:     General: No focal deficit present.     Mental Status: She is alert and oriented to person, place, and time. Mental status is at baseline.  Psychiatric:        Mood and Affect: Mood normal.        Behavior: Behavior normal.        Thought Content: Thought content normal.        Judgment: Judgment normal.     BP (!) 152/89   Pulse 100   Temp 98 F (36.7 C) (Temporal)   Resp 18   Ht 5' 7"  (1.702 m)   Wt 197 lb (89.4 kg)   LMP 11/03/2016 (Exact Date)   SpO2 100%   BMI 30.85 kg/m  Wt Readings from Last 3 Encounters:  02/19/21 197 lb (89.4 kg)  01/18/21 199 lb (90.3 kg)  01/14/21 199 lb 14.4 oz (90.7 kg)    There are no  preventive care reminders to display for this patient.  There are no preventive care reminders to display for this patient.   Lab Results  Component Value Date   TSH 1.480 06/14/2019   Lab Results  Component Value Date   WBC 11.1 (A) 02/08/2019   HGB 16.0 (A) 02/08/2019   HCT 45.6 (A) 02/08/2019   MCV 93.9 02/08/2019   PLT 269 12/15/2016   Lab Results  Component Value Date   NA 136 01/18/2021   K 4.0 01/18/2021   CO2 20 01/18/2021   GLUCOSE 137 (H) 01/18/2021   BUN 9 01/18/2021   CREATININE 0.58 01/18/2021   BILITOT 0.4 01/18/2021   ALKPHOS 63 01/18/2021   AST 17 01/18/2021   ALT 14 01/18/2021   PROT 7.3 01/18/2021   ALBUMIN 4.5 01/18/2021   CALCIUM 9.7 01/18/2021   ANIONGAP 7 11/22/2016   Lab Results  Component Value Date   CHOL 150 01/18/2021   Lab Results  Component Value Date   HDL 58 01/18/2021   Lab Results  Component Value Date   LDLCALC 50 01/18/2021   Lab Results  Component Value Date   TRIG 275 (H) 01/18/2021   Lab Results  Component Value Date   CHOLHDL 2.6 01/18/2021   Lab Results  Component Value Date   HGBA1C 7.2 (H) 01/18/2021       Assessment & Plan:   Problem List Items Addressed This Visit   None   Visit Diagnoses    Rectal pain    -  Primary   Relevant Medications   traMADol (ULTRAM) 50 MG tablet   lidocaine (XYLOCAINE) 5 % ointment   Other Relevant Orders   Ambulatory referral to General Surgery   Skin tag of anus       Relevant Orders   Ambulatory  referral to General Surgery   Thrombosed hemorrhoids       Relevant Orders   Ambulatory referral to General Surgery       Meds ordered this encounter  Medications  . traMADol (ULTRAM) 50 MG tablet    Sig: Take 1 tablet (50 mg total) by mouth every 8 (eight) hours as needed for up to 5 days.    Dispense:  15 tablet    Refill:  0    Order Specific Question:   Supervising Provider    Answer:   Carlota Raspberry, JEFFREY R [[id number]]  . lidocaine (XYLOCAINE) 5 % ointment    Sig:  Apply 1 application topically as needed.    Dispense:  35.44 g    Refill:  0    Order Specific Question:   Supervising Provider    Answer:   Carlota Raspberry, JEFFREY R [[id number]]   PLAN  Tramadol and lidocaine for pain. Pt reports mild constipation in past with opioid use, encourage her to start half dose miralax with tramadol  Written note for work for remainder of week  Refer to gen surg for intervention. Not sure that conservative care will continue to suffice given severity of tags and thrombosed hemorrhoids.  Patient encouraged to call clinic with any questions, comments, or concerns.   Maximiano Coss, NP

## 2021-02-19 NOTE — Addendum Note (Signed)
Addended by: Veneda Melter on: 02/19/2021 02:02 PM   Modules accepted: Orders

## 2021-02-19 NOTE — Patient Instructions (Signed)
° ° ° °  If you have lab work done today you will be contacted with your lab results within the next 2 weeks.  If you have not heard from us then please contact us. The fastest way to get your results is to register for My Chart. ° ° °IF you received an x-ray today, you will receive an invoice from [facility]. Please contact [facility] at [phone] with questions or concerns regarding your invoice.  ° °IF you received labwork today, you will receive an invoice from [facility]. Please contact [facility] at [phone] with questions or concerns regarding your invoice.  ° °Our billing staff will not be able to assist you with questions regarding bills from these companies. ° °You will be contacted with the lab results as soon as they are available. The fastest way to get your results is to activate your My Chart account. Instructions are located on the last page of this paperwork. If you have not heard from us regarding the results in 2 weeks, please contact this office. °  ° ° ° °

## 2021-03-11 ENCOUNTER — Ambulatory Visit: Payer: Self-pay | Admitting: Surgery

## 2021-03-11 NOTE — H&P (Signed)
Stacy Hull Appointment: 03/11/2021 4:30 PM Location: [facility] Patient #: [id number] DOB: 1971-10-06 Married / Language: Stacy Hull / Race: White Female  History of Present Illness Stacy Hull; 03/11/2021 5:07 PM) The patient is a 50 year old female who presents with hemorrhoids. Note for "Hemorrhoids": ` ` ` Patient sent for surgical consultation at the request of Stacy Hull  Chief Complaint: Anal pains with fissure versus symptomatic hemorrhoids. ` `   The past for hernia repair by Dr. Rosendo Hull 6 years ago. Recently had some anal pain and discomfort. Significant external hemorrhoids. History suspicious for fissure. Prescribed some lidocaine cream. Recommended colorectal follow-up. Patient notes she's been struggling with hemorrhoids ever since she had her son who is now 82 years old. She usually has about 2 bowel movements a day. She works as a Public relations account executive. She's getting increasing episodes of significant rectal bleeding after bowel movements and pain. She's try to soften her stools. She wonders if she has an irritable bowel type picture since she can get diarrhea with stress. A lot of cramping as well. She usually tries to Rideout flares with creams and warm soaks. Heart to do the work. She's had stool softeners to help soften her stool. Still struggled with intermittent significant bleeding. She saw [facility] gastroenterology. Dr. Michail Hull is planning colonoscopy later this month to rule out other sources of bleeding.  She Stacy Hull office urgently. History suspicious for anal fissure. Patient had tried diltiazem cream and did not know if that helps. She was offered a nitroglycerin base cream. She notes that the lidocaine 5% ointment helps her right out her hemorrhoid/anal pain and work better. She wanted a refill. She's been quite miserable and is hoping to get something done to more definitively deal with this problem.  No personal nor  family history of GI/colon cancer, inflammatory bowel disease, irritable bowel syndrome, allergy such as Celiac Sprue, dietary/dairy problems, colitis, ulcers nor gastritis. No recent sick contacts/gastroenteritis. No travel outside the country. No changes in diet. No dysphagia to solids or liquids. No significant heartburn or reflux. No melena, hematemesis, coffee ground emesis. No evidence of prior gastric/peptic ulceration.  (Review of systems as stated in this history (HPI) or in the review of systems. Otherwise all other 12 point ROS are negative) ` ` ###########################################`  This patient encounter took 35 minutes today to perform the following: obtain history, perform exam, review outside records, interpret tests & imaging, counsel the patient on their diagnosis; and, document this encounter, including findings & plan in the electronic health record (EHR).   Problem List/Past Medical Stacy Hector, Hull; 03/11/2021 5:10 PM) POSTOPERATIVE STATE (Z98.890) DIASTASIS RECTI (M62.08) ABDOMINAL HERNIA WITHOUT OBSTRUCTION AND WITHOUT GANGRENE, RECURRENCE NOT SPECIFIED, UNSPECIFIED HERNIA TYPE (K46.9) ANAL PAIN (K62.89) ENCOUNTER FOR PREOPERATIVE EXAMINATION FOR GENERAL SURGICAL PROCEDURE (Z01.818) EXTERNAL HEMORRHOIDS WITH COMPLICATION (W29.5)  Allergies Stacy Forehand, CNA; 03/11/2021 4:24 PM) Erythromycin *MACROLIDES* Allergies Reconciled  Medication History Stacy Forehand, CNA; 03/11/2021 4:24 PM) Percocet (5-325MG Tablet, 1-2 Tablet Oral q 4 hours, Taken starting 12/08/2014) Active. Lidocaine (5% Ointment, 1 (one) External apply to area 3 times daily, as needed, Taken starting 02/21/2021) Active. Lisinopril-Hydrochlorothiazide (20-12.5MG Tablet, Oral) Active. Albuterol Sulfate (108 (90 Base)MCG/ACT Aero Pow Br Act, Inhalation as needed) Active. Drisdol (50000UNIT Capsule, Oral once a week) Active. Hydrocodone-Acetaminophen (5-325MG Tablet,  Oral as needed) Active. Lisinopril-Hydrochlorothiazide (20-25MG Tablet, Oral daily) Active. Tranexamic Acid (650MG Tablet, Oral as needed) Active. (For heavy menstrual flow.) ALPRAZolam (0.5MG Tablet, Oral three times daily) Active.  Citalopram Hydrobromide (20MG Tablet, Oral daily) Active. Multivitamin Adults 50+ (Oral daily) Active. Medications Reconciled     Physical Exam Stacy Hull; 03/11/2021 5:03 PM)  General Mental Status-Alert. General Appearance-Not in acute distress, Not Sickly. Orientation-Oriented X3. Hydration-Well hydrated. Voice-Normal.  Integumentary Global Assessment Upon inspection and palpation of skin surfaces of the - Axillae: non-tender, no inflammation or ulceration, no drainage. and Distribution of scalp and body hair is normal. General Characteristics Temperature - normal warmth is noted.  Head and Neck Head-normocephalic, atraumatic with no lesions or palpable masses. Face Global Assessment - atraumatic, no absence of expression. Neck Global Assessment - no abnormal movements, no bruit auscultated on the right, no bruit auscultated on the left, no decreased range of motion, non-tender. Trachea-midline. Thyroid Gland Characteristics - non-tender.  Eye Eyeball - Left-Extraocular movements intact, No Nystagmus - Left. Eyeball - Right-Extraocular movements intact, No Nystagmus - Right. Cornea - Left-No Hazy - Left. Cornea - Right-No Hazy - Right. Sclera/Conjunctiva - Left-No scleral icterus, No Discharge - Left. Sclera/Conjunctiva - Right-No scleral icterus, No Discharge - Right. Pupil - Left-Direct reaction to light normal. Pupil - Right-Direct reaction to light normal.  ENMT Ears Pinna - Left - no drainage observed, no generalized tenderness observed. Pinna - Right - no drainage observed, no generalized tenderness observed. Nose and Sinuses External Inspection of the Nose - no destructive lesion  observed. Inspection of the nares - Left - quiet respiration. Inspection of the nares - Right - quiet respiration. Mouth and Throat Lips - Upper Lip - no fissures observed, no pallor noted. Lower Lip - no fissures observed, no pallor noted. Nasopharynx - no discharge present. Oral Cavity/Oropharynx - Tongue - no dryness observed. Oral Mucosa - no cyanosis observed. Hypopharynx - no evidence of airway distress observed.  Chest and Lung Exam Inspection Movements - Normal and Symmetrical. Accessory muscles - No use of accessory muscles in breathing. Palpation Palpation of the chest reveals - Non-tender. Auscultation Breath sounds - Normal and Clear.  Cardiovascular Auscultation Rhythm - Regular. Murmurs & Other Heart Sounds - Auscultation of the heart reveals - No Murmurs and No Systolic Clicks.  Abdomen Inspection Inspection of the abdomen reveals - No Visible peristalsis and No Abnormal pulsations. Umbilicus - No Bleeding, No Urine drainage. Palpation/Percussion Palpation and Percussion of the abdomen reveal - Soft, Non Tender, No Rebound tenderness, No Rigidity (guarding) and No Cutaneous hyperesthesia. Note: Abdomen obese but soft. Some supraumbilical diastases with no recurrent hernia obvious. Soft. Not severely distended. No umbilical or other anterior abdominal wall hernias  Female Genitourinary Sexual Maturity Tanner 5 - Adult hair pattern. Note: No vaginal bleeding nor discharge. No inguinal lymphadenopathy. No inguinal hernias.  Rectal Note: ######################################   Perianal skin clear with some tags. Right posterior and anterior midline anal tags/hemorrhoids. Increased sphincter tone. I cannot feel an obvious fissure in the anal canal but I cannot get my finger all the up due to severe pain and discomfort. Seems more discomfort posterior midline. Anterior midline anal canal and softer. I feel no obvious abscess nor do I see if fistula. No warts or  pilonidal disease.  Clinically suspicious for fissure but no obvious one seen/felt.  Peripheral Vascular Upper Extremity Inspection - Left - No Cyanotic nailbeds - Left, Not Ischemic. Inspection - Right - No Cyanotic nailbeds - Right, Not Ischemic.  Neurologic Neurologic evaluation reveals -normal attention span and ability to concentrate, able to name objects and repeat phrases. Appropriate fund of knowledge , normal sensation and normal coordination.  Mental Status Affect - not angry, not paranoid. Cranial Nerves-Normal Bilaterally. Gait-Normal.  Neuropsychiatric Mental status exam performed with findings of-able to articulate well with normal speech/language, rate, volume and coherence, thought content normal with ability to perform basic computations and apply abstract reasoning and no evidence of hallucinations, delusions, obsessions or homicidal/suicidal ideation.  Musculoskeletal Global Assessment Spine, Ribs and Pelvis - no instability, subluxation or laxity. Right Upper Extremity - no instability, subluxation or laxity.  Lymphatic Head & Neck  General Head & Neck Lymphatics: Bilateral - Description - No Localized lymphadenopathy. Axillary  General Axillary Region: Bilateral - Description - No Localized lymphadenopathy. Femoral & Inguinal  Generalized Femoral & Inguinal Lymphatics: Left - Description - No Localized lymphadenopathy. Right - Description - No Localized lymphadenopathy.    Assessment & Plan Stacy Hull; 03/11/2021 5:08 PM)  ANAL PAIN (K62.89) Impression: Patient struggling with severe anal pain. Suspicious for fissure but also has significant bleeding which region suspicion of hemorrhoids. No obvious fissure on exam. She's had persistent recurrent symptoms.  I offered a trial diltiazem 2 least tighter over but ultimately I think she needs examination under anesthesia to sort out was going on. I suspect she will need internal hemorrhoidal  ligation/Paxene hemorrhoidectomy and possible partial internal sphincterotomy depending on what we find. Should be an outpatient surgery. She is quite miserable with this but is hoping she can ride this out until after the end of the school year later next month  I cautioned that several weeks to recover before returning to work. She wants to time this in next month after I school is over. 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 We wrote a letter to allowher to have more frequent breaks until she can get her surgery.  With rectal bleeding, it makes sense to at some point consider colonoscopy. Sounds like she's going to have this happen next week by Dr. Michail Hull with [facility]. Most likely the bowel prep will be challenging to say the least. We will see.  Current Plans Pt Education - [facility] Anal Fissure (Niomie Englert) Pt Education - [facility] Good Bowel Health (Nalleli Largent) Started DILTIAZEM GEL, 2% (External Gel), 1 (one) application four times daily, 15 Gram, 03/11/2021, Ref. x3. Local Order: Pharmacist Notes: Apply on anus for 3-6 weeks to allow fissure to heal Continued Lidocaine 5 % External Ointment, 1 (one) Application apply to area 3 times daily, as needed, 10 Applicator, 99/24/2683, Ref. x5.  EXTERNAL HEMORRHOIDS WITH COMPLICATION (M19.6)  Current Plans Pt Education - [facility] Hemorrhoids (Lorena Benham): discussed with patient and provided information. Pt Education - Pamphlet Given - The Hemorrhoid Book: discussed with patient and provided information.  ENCOUNTER FOR PREOPERATIVE EXAMINATION FOR GENERAL SURGICAL PROCEDURE (Z01.818)  Current Plans You are being scheduled for surgery- Our schedulers will call you.  You should hear from our office's scheduling department within 5 working days about the location, date, and time of surgery. We try to make accommodations for patient's preferences in scheduling surgery, but sometimes the OR schedule or the surgeon's schedule prevents [place] from making  those accommodations.  If you have not heard from our office [phone]) in 5 working days, call the office and ask for your surgeon's nurse.  If you have other questions about your diagnosis, plan, or surgery, call the office and ask for your surgeon's nurse.  Pt Education - [facility] Rectal Prep for Anorectal outpatient/office surgery: discussed with patient and provided information. Pt Education - [facility] Rectal Surgery HCI (Lesha Jager): discussed with patient and  provided information. The anatomy & physiology of the anorectal region was discussed. The pathophysiology of anal fissure and differential diagnosis was discussed. Natural history progression was discussed. I stressed the importance of a bowel regimen to have daily soft bowel movements to minimize progression of disease.  The patient's condition is not adequately controlled. Non-operative treatment has not healed the fissure. Therefore, I recommended examination under anesthesia for better examination to confirm the diagnosis and treat by lateral internal sphincterotomy to relax the spasm better & allow the fissure to heal. Technique, benefits, alternatives were discussed. I noted a good likelihood this will help address the problem. Risks such as bleeding, pain, incontinence, recurrence, heart attack, death, and other risks were discussed.  Educational handouts further explaining the pathology, treatment options, and bowel regimen were given as well. The patient expressed understanding & wishes to proceed with surgery.  The anatomy & physiology of the anorectal region was discussed. The pathophysiology of hemorrhoids and differential diagnosis was discussed. Natural history risks without surgery was discussed. I stressed the importance of a bowel regimen to have daily soft bowel movements to minimize progression of disease. Interventions such as sclerotherapy & banding were discussed.  The patient's symptoms are not adequately  controlled by medicines and other non-operative treatments. I feel the risks & problems of no surgery outweigh the operative risks; therefore, I recommended surgery to treat the hemorrhoids by ligation, pexy, and possible resection.  Risks such as bleeding, infection, urinary difficulties, need for further treatment, heart attack, death, and other risks were discussed. I noted a good likelihood this will help address the problem. Goals of post-operative recovery were discussed as well. Possibility that this will not correct all symptoms was explained. Post-operative pain, bleeding, constipation, and other problems after surgery were discussed. We will work to minimize complications. Educational handouts further explaining the pathology, treatment options, and bowel regimen were given as well. Questions were answered. The patient expresses understanding & wishes to proceed with surgery.  Stacy Hector, Hull, FACS, MASCRS  Esophageal, Gastrointestinal & Colorectal Surgery Robotic and Minimally Invasive Surgery [facility] [address] [place], [place] [address] [phone] Fax [phone] Main/Paging  CONTACT INFORMATION: Weekday (9AM-5PM) concerns: Call [facility] main office at [phone] Weeknight (5PM-9AM) or Weekend/Holiday concerns: Check [email or web address] for General Surgery [facility] coverage (Please, do not use SecureChat as it is not reliable communication to operating surgeons for immediate patient care)

## 2021-03-21 LAB — HM COLONOSCOPY

## 2021-03-28 ENCOUNTER — Encounter: Payer: Self-pay | Admitting: Family Medicine

## 2021-03-28 ENCOUNTER — Other Ambulatory Visit: Payer: Self-pay

## 2021-03-28 ENCOUNTER — Ambulatory Visit: Payer: BC Managed Care – PPO | Admitting: Family Medicine

## 2021-03-28 VITALS — BP 128/80 | HR 93 | Temp 98.2°F | Resp 16 | Ht 67.0 in | Wt 197.6 lb

## 2021-03-28 DIAGNOSIS — F411 Generalized anxiety disorder: Secondary | ICD-10-CM

## 2021-03-28 DIAGNOSIS — K649 Unspecified hemorrhoids: Secondary | ICD-10-CM

## 2021-03-28 DIAGNOSIS — R197 Diarrhea, unspecified: Secondary | ICD-10-CM

## 2021-03-28 DIAGNOSIS — R42 Dizziness and giddiness: Secondary | ICD-10-CM | POA: Diagnosis not present

## 2021-03-28 DIAGNOSIS — Z9181 History of falling: Secondary | ICD-10-CM

## 2021-03-28 DIAGNOSIS — R1013 Epigastric pain: Secondary | ICD-10-CM

## 2021-03-28 DIAGNOSIS — K625 Hemorrhage of anus and rectum: Secondary | ICD-10-CM

## 2021-03-28 DIAGNOSIS — K602 Anal fissure, unspecified: Secondary | ICD-10-CM

## 2021-03-28 LAB — COMPREHENSIVE METABOLIC PANEL
ALT: 20 U/L (ref 0–35)
AST: 18 U/L (ref 0–37)
Albumin: 4.5 g/dL (ref 3.5–5.2)
Alkaline Phosphatase: 57 U/L (ref 39–117)
BUN: 7 mg/dL (ref 6–23)
CO2: 25 mEq/L (ref 19–32)
Calcium: 9.8 mg/dL (ref 8.4–10.5)
Chloride: 97 mEq/L (ref 96–112)
Creatinine, Ser: 0.46 mg/dL (ref 0.40–1.20)
GFR: 113.8 mL/min (ref >60.00)
Glucose, Bld: 125 mg/dL — ABNORMAL HIGH (ref 70–99)
Potassium: 3.4 mEq/L — ABNORMAL LOW (ref 3.5–5.1)
Sodium: 134 mEq/L — ABNORMAL LOW (ref 135–145)
Total Bilirubin: 0.8 mg/dL (ref 0.2–1.2)
Total Protein: 7.8 g/dL (ref 6.0–8.3)

## 2021-03-28 LAB — CBC
HCT: 44.8 % (ref 36.0–46.0)
Hemoglobin: 15.4 g/dL — ABNORMAL HIGH (ref 12.0–15.0)
MCHC: 34.4 g/dL (ref 30.0–36.0)
MCV: 94.9 fl (ref 78.0–100.0)
Platelets: 345 10*3/uL (ref 150.0–400.0)
RBC: 4.72 Mil/uL (ref 3.87–5.11)
RDW: 12.5 % (ref 11.5–15.5)
WBC: 10.8 10*3/uL — ABNORMAL HIGH (ref 4.0–10.5)

## 2021-03-28 MED ORDER — ALPRAZOLAM 0.5 MG PO TABS: ORAL_TABLET | ORAL | 0 refills | Status: DC

## 2021-03-28 NOTE — Patient Instructions (Addendum)
I will check bloodwork today. Continue to try to drink small amounts of fluids frequently. I will discuss your symptoms with gastroenterology today as well.  Be careful taking xanax as that may worsen lightheadedness.  Go to ER if any worsening of symptoms.  Return to the clinic or go to the nearest emergency room if any of your symptoms worsen or new symptoms occur.

## 2021-03-28 NOTE — Progress Notes (Signed)
Subjective:  Patient ID: Stacy Hull, female    DOB: 03-15-1971  Age: 50 y.o. MRN: [id number]  + CC:  Chief Complaint  Patient presents with  . Hemorrhoids    Pt has seen R.Morrow for hemorrhoids, was given Gen Surg referral, was at work Monday and had dizzy spells and EMS was called BP 190/122 BG 169, pt was told to get checked due to rectal bleeding and should be checked out by PCP. Gen surg appt on 04/26/2021 for procedure for removal  . Fall    Pt had a fall yesterday due to light headed spell, no injury small scrape, pt wants labs checked due to possible anemia or other blood loss concern   . Anxiety    Pt also requesting a refill alprazolam due to anxiety over current condition pt notes this helps no side effects she has been more nervous recently given GI issues and dizzy apells GAD7 was 10 2 months ago score is 12 today, pt is aware should see therapy and has their contact and will schedule soon reports busy at work     HPI Stacy Hull presents for   Appointment scheduled for hemorrhoids, multiple concerns as above.  Hemorrhoids: Evaluated by Kathrin Ruddy in March, referred to general surgery, evaluated by Dr. Johney Maine.  Plan for surgical treatment May 27. Had colonoscopy last Thursday. Off and on bleeding since last week, more bleeding - daily, but not not nonstop.  Lightheaded over the weekend. More dizzy on Tuesday.  Internal/external hemoorhoids and fissure.   Fall:  Reports being at work 2 days ago feeling lightheaded, dizzy, BP 190/122 EMS called and reportedly blood pressure was around 160/100. blood glucose 169. Out of work yesterday as dizzy. She did have a fall yesterday at home without injury other than a small scrape on foot. Able to WB ok.moving joints ok.  Trip on bottom step, tripped. No LOC. No head injury.  Felt lightheaded at the time. Some decreased appetite, but drinking water and electrolyte drink.  Still having some diarrhea bowel movements. 3-5 per  day (just after eating). Diarrhea in past at times, but now more since colonoscopy and having to go in middle of night at times. No fever.  Upper abdominal pain off and on forever - worse since prep since colonoscopy.   Generalized anxiety disorder Last discussed in February.  History of depression with anxiety.  Treated with Celexa, trazodone, Xanax 1-2 times per week at that time.  Plan for follow-up with therapist due to her increased anxiety symptoms, handout given on stress, continue on same meds.  She has not followed up with therapist.  Increased stress with other health issues recently.  Taking xanax few days per week - not daily. Took yesterday.  Controlled substance database (PDMP) reviewed. No concerns appreciated.xanax Last filled 02/19/21  No alcohol, no IDU.  No CP/palpitations.    GAD 7 : Generalized Anxiety Score 03/28/2021 01/18/2021 06/12/2020 02/23/2020  Nervous, Anxious, on Edge 2 1 1 1   Control/stop worrying 2 1 0 1  Worry too much - different things 3 1 1  0  Trouble relaxing 2 3 1 1   Restless 1 0 0 0  Easily annoyed or irritable 2 3 1 1   Afraid - awful might happen 0 1 0 0  Total GAD 7 Score 12 10 4 4   Anxiety Difficulty - - Somewhat difficult Somewhat difficult       History Patient Active Problem List   Diagnosis Date Noted  .  Left corneal abrasion 01/09/2020  . Controlled type 2 diabetes mellitus without complication, without long-term current use of insulin ([place]) 10/13/2019  . GAD (generalized anxiety disorder) 03/22/2019  . Sinus congestion 11/18/2017  . Acute non-recurrent maxillary sinusitis 11/18/2017  . S/P hysterectomy 11/21/2016  . Vitamin D deficiency 05/12/2016  . Anxiety state 09/27/2007  . Hyperlipidemia 03/18/2007  . Depression 03/18/2007  . Essential hypertension 03/18/2007   Past Medical History:  Diagnosis Date  . Allergic rhinitis   . Anxiety   . Asthma    after URI  . Depression   . GERD (gastroesophageal reflux disease)     takes TUMS  . HTN (hypertension)   . Hyperlipidemia   . Hyperparathyroidism ([place])   . Pneumonia    Past Surgical History:  Procedure Laterality Date  . ABDOMINAL HYSTERECTOMY N/A    Phreesia 01/18/2021  . CERVICAL CONE BIOPSY    . CESAREAN SECTION  2005  . CESAREAN SECTION N/A    Phreesia 01/18/2021  . HERNIA REPAIR N/A    Phreesia 01/18/2021  . HYSTEROSCOPY WITH NOVASURE  11/26/2012   Procedure: HYSTEROSCOPY WITH NOVASURE;  Surgeon: Marvene Staff, MD;  Location: [facility];  Service: Gynecology;  Laterality: N/A;  . LAPAROSCOPIC LYSIS OF ADHESIONS N/A 11/21/2016   Procedure: EXTENSIVE LAPAROSCOPIC LYSIS OF ADHESIONS;  Surgeon: Servando Salina, MD;  Location: [facility];  Service: Gynecology;  Laterality: N/A;  . LAPAROSCOPY N/A 11/21/2016   Procedure: LAPAROSCOPY DIAGNOSTIC, LAPAROSCOPIC REMOVAL OF FOREIGN BODY;  Surgeon: Ralene Ok, MD;  Location: [facility];  Service: General;  Laterality: N/A;  . PARATHYROIDECTOMY  1999   benign  . REPAIR VAGINAL CUFF N/A 12/14/2016   Procedure: REPAIR VAGINAL CUFF;  Surgeon: Brien Few, MD;  Location: [facility];  Service: Gynecology;  Laterality: N/A;  . ROBOTIC ASSISTED LAPAROSCOPIC OVARIAN CYSTECTOMY Right 10/01/2015   Procedure: ROBOTIC ASSISTED LAPAROSCOPIC BILATERAL SALPINGECTOMY WITH LYSIS OF ADHESIONS AND RIGHT ADNEXAL CYST;  Surgeon: Princess Bruins, MD;  Location: [facility];  Service: Gynecology;  Laterality: Right;  POSSIBLE robot .  DO NOT DRAPE THE ROBOT.....she will use the laparoscopic camera to start.    . ROBOTIC ASSISTED TOTAL HYSTERECTOMY WITH SALPINGECTOMY N/A 11/21/2016   Procedure: ROBOTIC ASSISTED TOTAL HYSTERECTOMY WITH  EXTENSIVE LYSIS OF ADHESIONS;  Surgeon: Servando Salina, MD;  Location: [facility];  Service: Gynecology;  Laterality: N/A;  . TUBAL LIGATION    . UMBILICAL HERNIA REPAIR    . WISDOM TOOTH EXTRACTION     Allergies  Allergen Reactions  . Erythromycin Other (See Comments)    Reaction:  GI upset     Prior to Admission medications   Medication Sig Start Date End Date Taking? Authorizing Provider  ALPRAZolam Duanne Moron) 0.5 MG tablet TAKE 1 TABLET(0.5 MG) BY MOUTH DAILY AS NEEDED FOR ANXIETY 01/18/21  Yes Wendie Agreste, MD  atorvastatin (LIPITOR) 20 MG tablet Take 1 tablet (20 mg total) by mouth daily. 10/19/20  Yes Posey Boyer, MD  citalopram (CELEXA) 40 MG tablet Take 1 tablet (67m) by mouth daily 01/18/21  Yes GWendie Agreste MD  hydrocortisone-pramoxine ([employer] 2.5-1 % rectal cream Place 1 application rectally 3 (three) times daily. 10/19/20  Yes HPosey Boyer MD  lidocaine (XYLOCAINE) 5 % ointment Apply 1 application topically as needed. 02/19/21  Yes MMaximiano Coss NP  lisinopril-hydrochlorothiazide (ZESTORETIC) 20-25 MG tablet Take 1 tablet by mouth daily. 01/18/21  Yes GWendie Agreste MD  Multiple Vitamin (MULTIVITAMIN WITH MINERALS) TABS tablet Take 1 tablet by mouth  daily.   Yes [provider]  traZODone (DESYREL) 50 MG tablet TAKE 1 TO 2 TABLETS(50 TO 100 MG) BY MOUTH AT BEDTIME 01/18/21  Yes Wendie Agreste, MD  triamcinolone (KENALOG) 0.1 % Apply 1 application topically 2 (two) times daily. 01/14/21  Yes Just, Laurita Quint, FNP  triamcinolone (NASACORT) 55 MCG/ACT AERO nasal inhaler Place 2 sprays into the nose daily. 01/26/20  Yes Jacelyn Pi, Lilia Argue, MD  Vitamin D, Ergocalciferol, (DRISDOL) 1.25 MG (50000 UNIT) CAPS capsule Take 1 capsule (50,000 Units total) by mouth every 7 (seven) days. 01/28/21  Yes Wendie Agreste, MD  ibuprofen (ADVIL) 800 MG tablet Take 1 tablet (800 mg total) by mouth every 8 (eight) hours as needed for moderate pain. Take with food Patient not taking: Reported on 03/28/2021 01/14/21   Just, Laurita Quint, FNP   Social History   Socioeconomic History  . Marital status: Married    Spouse name: Mackenzie Groom  . Number of children: 2  . Years of education: Master's +  . Highest education level: Not on file  Occupational History   . Occupation: HIGH SCHOOL ENGLISH     Employer: [employer]    Comment: [employer]  Tobacco Use  . Smoking status: Never Smoker  . Smokeless tobacco: Never Used  Vaping Use  . Vaping Use: Never used  Substance and Sexual Activity  . Alcohol use: Yes    Alcohol/week: 4.0 standard drinks    Types: 4 Standard drinks or equivalent per week    Comment: occasionally  . Drug use: No  . Sexual activity: Yes    Partners: Male    Birth control/protection: Surgical  Other Topics Concern  . Not on file  Social History Narrative   Lives with her husband and their 2 children. Walks 3x's weekly for exercise.   Social Determinants of Health   Financial Resource Strain: Not on file  Food Insecurity: Not on file  Transportation Needs: Not on file  Physical Activity: Not on file  Stress: Not on file  Social Connections: Not on file  Intimate Partner Violence: Not on file    Review of Systems As above in HPI.  Objective:   Vitals:   03/28/21 0927 03/28/21 0936  BP: (!) 144/88 128/80  Pulse: 93   Resp: 16   Temp: 98.2 F (36.8 C)   TempSrc: Temporal   SpO2: 97%   Weight: 197 lb 9.6 oz (89.6 kg)   Height: 5' 7"  (1.702 m)      Physical Exam Vitals reviewed.  Constitutional:      Appearance: She is well-developed.  HENT:     Head: Normocephalic and atraumatic.  Eyes:     Conjunctiva/sclera: Conjunctivae normal.     Pupils: Pupils are equal, round, and reactive to light.  Neck:     Vascular: No carotid bruit.  Cardiovascular:     Rate and Rhythm: Normal rate and regular rhythm.     Heart sounds: Normal heart sounds.  Pulmonary:     Effort: Pulmonary effort is normal.     Breath sounds: Normal breath sounds.  Abdominal:     General: There is no distension.     Palpations: Abdomen is soft. There is no mass or pulsatile mass.     Tenderness: There is abdominal tenderness (epigastric. neg murphys, no rebound/guarding. ).  Musculoskeletal:      Comments: Left ankle, foot no bony tenderness, pain-free range of motion of ankle, no significant swelling.  Small bandage on dorsum of foot without surrounding erythema  Skin:    General: Skin is warm and dry.  Neurological:     Mental Status: She is alert and oriented to person, place, and time.  Psychiatric:        Behavior: Behavior normal.    Orthostatic VS for the past 24 hrs (Last 3 readings):  BP- Lying Pulse- Lying BP- Sitting Pulse- Sitting BP- Standing at 0 minutes Pulse- Standing at 0 minutes BP- Standing at 3 minutes Pulse- Standing at 3 minutes  03/28/21 1039 142/86 88 148/90 93 150/90 108 144/88 66    Assessment & Plan:  ANTIONETTA ATOR is a 50 y.o. female . Lightheadedness - Plan: CBC, Comprehensive metabolic panel, Orthostatic vital signs Epigastric abdominal pain - Plan: Comprehensive metabolic panel Diarrhea, unspecified type - Plan: Comprehensive metabolic panel Rectal bleeding Hemorrhoids, unspecified hemorrhoid type Anal fissure History of fall  -Persistent bleeding from hemorrhoids, fissure likely.  With recent lightheadedness, will check CBC.  Orthostatics okay.  -Recurrent abdominal pain, check CMP, CBC as above.  Differential includes IBS.  May have some loose stools since her colonoscopy, but if persistent may need an office evaluation versus hospital eval if persistent bleeding.  Discussed with gastroenterology.  ER precautions given if acute worsening today.  Potentially may need sooner appointment for surgery with her hemorrhoid, fissure if persistent bleeding from these areas.  GAD (generalized anxiety disorder) - Plan: ALPRAZolam (XANAX) 0.5 MG tablet  -Persistent symptoms, refilled alprazolam temporarily for now, no other med changes at this time.  Suspect component of adjustment disorder with above medical history.     Meds ordered this encounter  Medications  . ALPRAZolam (XANAX) 0.5 MG tablet    Sig: TAKE 1 TABLET(0.5 MG) BY MOUTH DAILY  AS NEEDED FOR ANXIETY    Dispense:  30 tablet    Refill:  0   Patient Instructions  I will check bloodwork today. Continue to try to drink small amounts of fluids frequently. I will discuss your symptoms with gastroenterology today as well.  Be careful taking xanax as that may worsen lightheadedness.   Return to the clinic or go to the nearest emergency room if any of your symptoms worsen or new symptoms occur.        Signed, Merri Ray, MD [facility]

## 2021-04-05 ENCOUNTER — Ambulatory Visit (INDEPENDENT_AMBULATORY_CARE_PROVIDER_SITE_OTHER): Payer: BC Managed Care – PPO | Admitting: Psychology

## 2021-04-05 DIAGNOSIS — F4323 Adjustment disorder with mixed anxiety and depressed mood: Secondary | ICD-10-CM

## 2021-04-11 ENCOUNTER — Other Ambulatory Visit: Payer: Self-pay | Admitting: Gastroenterology

## 2021-04-11 DIAGNOSIS — K529 Noninfective gastroenteritis and colitis, unspecified: Secondary | ICD-10-CM

## 2021-04-11 DIAGNOSIS — K625 Hemorrhage of anus and rectum: Secondary | ICD-10-CM

## 2021-04-12 ENCOUNTER — Ambulatory Visit
Admission: RE | Admit: 2021-04-12 | Discharge: 2021-04-12 | Disposition: A | Discharge status: Home / Self Care | Payer: BC Managed Care – PPO | Source: Ambulatory Visit | Attending: Gastroenterology | Admitting: Gastroenterology

## 2021-04-12 DIAGNOSIS — K529 Noninfective gastroenteritis and colitis, unspecified: Secondary | ICD-10-CM

## 2021-04-12 DIAGNOSIS — K625 Hemorrhage of anus and rectum: Secondary | ICD-10-CM

## 2021-04-12 MED ORDER — IOPAMIDOL (ISOVUE-300) INJECTION 61%
100.0000 mL | Freq: Once | INTRAVENOUS | Status: AC | PRN
  Administered 2021-04-12: 100 mL via INTRAVENOUS

## 2021-04-15 ENCOUNTER — Ambulatory Visit (INDEPENDENT_AMBULATORY_CARE_PROVIDER_SITE_OTHER): Payer: BC Managed Care – PPO | Admitting: Psychology

## 2021-04-15 DIAGNOSIS — F4323 Adjustment disorder with mixed anxiety and depressed mood: Secondary | ICD-10-CM | POA: Diagnosis not present

## 2021-04-23 ENCOUNTER — Other Ambulatory Visit: Payer: Self-pay

## 2021-04-23 ENCOUNTER — Encounter (HOSPITAL_BASED_OUTPATIENT_CLINIC_OR_DEPARTMENT_OTHER): Payer: Self-pay | Admitting: Surgery

## 2021-04-23 ENCOUNTER — Other Ambulatory Visit (HOSPITAL_COMMUNITY)
Admission: RE | Admit: 2021-04-23 | Discharge: 2021-04-23 | Disposition: A | Discharge status: Home / Self Care | Payer: BC Managed Care – PPO | Source: Ambulatory Visit | Attending: Surgery | Admitting: Surgery

## 2021-04-23 DIAGNOSIS — Z01812 Encounter for preprocedural laboratory examination: Secondary | ICD-10-CM | POA: Insufficient documentation

## 2021-04-23 DIAGNOSIS — Z20822 Contact with and (suspected) exposure to covid-19: Secondary | ICD-10-CM | POA: Insufficient documentation

## 2021-04-23 DIAGNOSIS — K642 Third degree hemorrhoids: Secondary | ICD-10-CM | POA: Diagnosis not present

## 2021-04-23 DIAGNOSIS — K625 Hemorrhage of anus and rectum: Secondary | ICD-10-CM | POA: Diagnosis not present

## 2021-04-23 DIAGNOSIS — Z79899 Other long term (current) drug therapy: Secondary | ICD-10-CM | POA: Diagnosis not present

## 2021-04-23 DIAGNOSIS — K6289 Other specified diseases of anus and rectum: Secondary | ICD-10-CM | POA: Diagnosis not present

## 2021-04-23 DIAGNOSIS — K644 Residual hemorrhoidal skin tags: Secondary | ICD-10-CM | POA: Diagnosis not present

## 2021-04-23 DIAGNOSIS — Z8616 Personal history of COVID-19: Secondary | ICD-10-CM | POA: Diagnosis not present

## 2021-04-23 DIAGNOSIS — Z881 Allergy status to other antibiotic agents status: Secondary | ICD-10-CM | POA: Diagnosis not present

## 2021-04-23 LAB — SARS CORONAVIRUS 2 (TAT 6-24 HRS): SARS Coronavirus 2: NEGATIVE

## 2021-04-23 NOTE — Progress Notes (Addendum)
Spoke w/ via phone for pre-op interview---PT Lab needs dos----    None has lab appt 04-25-2021 for cbc bmp and ekg Lab results------ COVID test -----patient states asymptomatic no test needed Arrive at -------930 am 04-26-2021 NPO after MN NO Solid Food.  Clear liquids from MN until-- [place] AT 48 AM-830 am then npo Med rec completed Medications to take morning of surgery -----alprazolam prn, citalopram, nasacort nasal spray, budesonide, albuterol inhaler prn/bring inhaler with you Diabetic medication ----- Patient instructed to bring photo id and insurance card day of surgery Patient aware to have Driver (ride ) / caregiver    for 24 hours after surgery  Patient Special Instructions -----none Pre-Op special Istructions -----none Patient verbalized understanding of instructions that were given at this phone interview. Patient denies shortness of breath, chest pain, fever, cough at this phone interview.

## 2021-04-24 ENCOUNTER — Ambulatory Visit (INDEPENDENT_AMBULATORY_CARE_PROVIDER_SITE_OTHER): Payer: BC Managed Care – PPO | Admitting: Psychology

## 2021-04-24 DIAGNOSIS — F4323 Adjustment disorder with mixed anxiety and depressed mood: Secondary | ICD-10-CM

## 2021-04-25 ENCOUNTER — Encounter (HOSPITAL_COMMUNITY)
Admission: RE | Admit: 2021-04-25 | Discharge: 2021-04-25 | Disposition: A | Discharge status: Home / Self Care | Payer: BC Managed Care – PPO | Source: Ambulatory Visit | Attending: Surgery | Admitting: Surgery

## 2021-04-25 ENCOUNTER — Other Ambulatory Visit: Payer: Self-pay

## 2021-04-25 DIAGNOSIS — Z01818 Encounter for other preprocedural examination: Secondary | ICD-10-CM | POA: Insufficient documentation

## 2021-04-25 DIAGNOSIS — K644 Residual hemorrhoidal skin tags: Secondary | ICD-10-CM | POA: Diagnosis not present

## 2021-04-25 DIAGNOSIS — K642 Third degree hemorrhoids: Secondary | ICD-10-CM | POA: Diagnosis not present

## 2021-04-25 DIAGNOSIS — K625 Hemorrhage of anus and rectum: Secondary | ICD-10-CM | POA: Diagnosis not present

## 2021-04-25 DIAGNOSIS — K6289 Other specified diseases of anus and rectum: Secondary | ICD-10-CM | POA: Diagnosis not present

## 2021-04-25 LAB — BASIC METABOLIC PANEL
Anion gap: 10 (ref 5–15)
BUN: 14 mg/dL (ref 6–20)
CO2: 25 mmol/L (ref 22–32)
Calcium: 9.4 mg/dL (ref 8.9–10.3)
Chloride: 101 mmol/L (ref 98–111)
Creatinine, Ser: 0.58 mg/dL (ref 0.44–1.00)
GFR, Estimated: >60 mL/min (ref >60)
Glucose, Bld: 146 mg/dL — ABNORMAL HIGH (ref 70–99)
Potassium: 4.1 mmol/L (ref 3.5–5.1)
Sodium: 136 mmol/L (ref 135–145)

## 2021-04-25 LAB — CBC
HCT: 41.5 % (ref 36.0–46.0)
Hemoglobin: 14.4 g/dL (ref 12.0–15.0)
MCH: 32.5 pg (ref 26.0–34.0)
MCHC: 34.7 g/dL (ref 30.0–36.0)
MCV: 93.7 fL (ref 80.0–100.0)
Platelets: 287 10*3/uL (ref 150–400)
RBC: 4.43 MIL/uL (ref 3.87–5.11)
RDW: 11.8 % (ref 11.5–15.5)
WBC: 10.5 10*3/uL (ref 4.0–10.5)
nRBC: 0 % (ref 0.0–0.2)

## 2021-04-26 ENCOUNTER — Ambulatory Visit (HOSPITAL_BASED_OUTPATIENT_CLINIC_OR_DEPARTMENT_OTHER)
Admission: RE | Admit: 2021-04-26 | Discharge: 2021-04-26 | Disposition: A | Discharge status: Home / Self Care | Payer: BC Managed Care – PPO | Attending: Surgery | Admitting: Surgery

## 2021-04-26 ENCOUNTER — Encounter (HOSPITAL_BASED_OUTPATIENT_CLINIC_OR_DEPARTMENT_OTHER)
Admission: RE | Disposition: A | Discharge status: Home / Self Care | Payer: Self-pay | Source: Home / Self Care | Attending: Surgery

## 2021-04-26 ENCOUNTER — Encounter (HOSPITAL_BASED_OUTPATIENT_CLINIC_OR_DEPARTMENT_OTHER): Payer: Self-pay | Admitting: Surgery

## 2021-04-26 ENCOUNTER — Ambulatory Visit (HOSPITAL_BASED_OUTPATIENT_CLINIC_OR_DEPARTMENT_OTHER): Payer: BC Managed Care – PPO | Admitting: Anesthesiology

## 2021-04-26 DIAGNOSIS — K642 Third degree hemorrhoids: Secondary | ICD-10-CM

## 2021-04-26 DIAGNOSIS — K644 Residual hemorrhoidal skin tags: Secondary | ICD-10-CM | POA: Insufficient documentation

## 2021-04-26 DIAGNOSIS — K6289 Other specified diseases of anus and rectum: Secondary | ICD-10-CM | POA: Diagnosis not present

## 2021-04-26 DIAGNOSIS — K5000 Crohn's disease of small intestine without complications: Secondary | ICD-10-CM

## 2021-04-26 DIAGNOSIS — Z881 Allergy status to other antibiotic agents status: Secondary | ICD-10-CM | POA: Insufficient documentation

## 2021-04-26 DIAGNOSIS — K625 Hemorrhage of anus and rectum: Secondary | ICD-10-CM | POA: Insufficient documentation

## 2021-04-26 DIAGNOSIS — K635 Polyp of colon: Secondary | ICD-10-CM

## 2021-04-26 DIAGNOSIS — K602 Anal fissure, unspecified: Secondary | ICD-10-CM | POA: Diagnosis present

## 2021-04-26 DIAGNOSIS — Z79899 Other long term (current) drug therapy: Secondary | ICD-10-CM | POA: Insufficient documentation

## 2021-04-26 DIAGNOSIS — Z8616 Personal history of COVID-19: Secondary | ICD-10-CM | POA: Insufficient documentation

## 2021-04-26 HISTORY — PX: EVALUATION UNDER ANESTHESIA WITH HEMORRHOIDECTOMY: SHX5624

## 2021-04-26 HISTORY — DX: Type 2 diabetes mellitus without complications: E11.9

## 2021-04-26 HISTORY — DX: Presence of spectacles and contact lenses: Z97.3

## 2021-04-26 LAB — GLUCOSE, CAPILLARY
Glucose-Capillary: 165 mg/dL — ABNORMAL HIGH (ref 70–99)
Glucose-Capillary: 254 mg/dL — ABNORMAL HIGH (ref 70–99)
Glucose-Capillary: 199 mg/dL — ABNORMAL HIGH (ref 70–99)

## 2021-04-26 SURGERY — EXAM UNDER ANESTHESIA WITH HEMORRHOIDECTOMY
Anesthesia: General | Site: Rectum

## 2021-04-26 MED ORDER — SODIUM CHLORIDE 0.9 % IV SOLN
INTRAVENOUS | Status: AC
  Filled 2021-04-26: qty 100

## 2021-04-26 MED ORDER — MIDAZOLAM HCL 2 MG/2ML IJ SOLN
INTRAMUSCULAR | Status: AC
  Filled 2021-04-26: qty 2

## 2021-04-26 MED ORDER — METRONIDAZOLE 500 MG/100ML IV SOLN
INTRAVENOUS | Status: AC
  Filled 2021-04-26: qty 100

## 2021-04-26 MED ORDER — CHLORHEXIDINE GLUCONATE CLOTH 2 % EX PADS: 6.0000 | MEDICATED_PAD | Freq: Once | CUTANEOUS | Status: DC

## 2021-04-26 MED ORDER — DEXAMETHASONE SODIUM PHOSPHATE 10 MG/ML IJ SOLN
INTRAMUSCULAR | Status: AC
  Filled 2021-04-26: qty 1

## 2021-04-26 MED ORDER — DROPERIDOL 2.5 MG/ML IJ SOLN
INTRAMUSCULAR | Status: DC | PRN
  Administered 2021-04-26: .625 mg via INTRAVENOUS

## 2021-04-26 MED ORDER — ROCURONIUM BROMIDE 100 MG/10ML IV SOLN
INTRAVENOUS | Status: DC | PRN
  Administered 2021-04-26: 70 mg via INTRAVENOUS
  Administered 2021-04-26: 20 mg via INTRAVENOUS

## 2021-04-26 MED ORDER — ONDANSETRON HCL 4 MG/2ML IJ SOLN
INTRAMUSCULAR | Status: AC
  Filled 2021-04-26: qty 2

## 2021-04-26 MED ORDER — INSULIN ASPART 100 UNIT/ML IJ SOLN
INTRAMUSCULAR | Status: AC
  Filled 2021-04-26: qty 1

## 2021-04-26 MED ORDER — OXYCODONE HCL 5 MG PO TABS
ORAL_TABLET | ORAL | Status: AC
  Filled 2021-04-26: qty 1

## 2021-04-26 MED ORDER — GLYCOPYRROLATE 0.2 MG/ML IJ SOLN
INTRAMUSCULAR | Status: DC | PRN
  Administered 2021-04-26: .2 mg via INTRAVENOUS

## 2021-04-26 MED ORDER — GLYCOPYRROLATE PF 0.2 MG/ML IJ SOSY
PREFILLED_SYRINGE | INTRAMUSCULAR | Status: AC
  Filled 2021-04-26: qty 1

## 2021-04-26 MED ORDER — GABAPENTIN 300 MG PO CAPS
ORAL_CAPSULE | ORAL | Status: AC
  Filled 2021-04-26: qty 1

## 2021-04-26 MED ORDER — OXYCODONE HCL 5 MG PO TABS: 5.0000 mg | ORAL_TABLET | Freq: Four times a day (QID) | ORAL | 0 refills | Status: DC | PRN

## 2021-04-26 MED ORDER — LIDOCAINE 2% (20 MG/ML) 5 ML SYRINGE
INTRAMUSCULAR | Status: AC
  Filled 2021-04-26: qty 5

## 2021-04-26 MED ORDER — ENSURE PRE-SURGERY PO LIQD
296.0000 mL | Freq: Once | ORAL | Status: DC
Start: 2021-04-27 — End: 2021-04-26

## 2021-04-26 MED ORDER — SODIUM CHLORIDE 0.9 % IV SOLN
2.0000 g | INTRAVENOUS | Status: AC
  Administered 2021-04-26: 2 g via INTRAVENOUS

## 2021-04-26 MED ORDER — CEFTRIAXONE SODIUM 2 G IJ SOLR
INTRAMUSCULAR | Status: AC
  Filled 2021-04-26: qty 20

## 2021-04-26 MED ORDER — ONDANSETRON HCL 4 MG/2ML IJ SOLN
INTRAMUSCULAR | Status: DC | PRN
  Administered 2021-04-26: 4 mg via INTRAVENOUS

## 2021-04-26 MED ORDER — ONDANSETRON HCL 4 MG/2ML IJ SOLN: 4.0000 mg | Freq: Once | INTRAMUSCULAR | Status: DC | PRN

## 2021-04-26 MED ORDER — FENTANYL CITRATE (PF) 100 MCG/2ML IJ SOLN: 25.0000 ug | INTRAMUSCULAR | Status: DC | PRN

## 2021-04-26 MED ORDER — ROCURONIUM BROMIDE 10 MG/ML (PF) SYRINGE
PREFILLED_SYRINGE | INTRAVENOUS | Status: AC
  Filled 2021-04-26: qty 10

## 2021-04-26 MED ORDER — LACTATED RINGERS IV SOLN: INTRAVENOUS | Status: DC

## 2021-04-26 MED ORDER — CELECOXIB 200 MG PO CAPS
200.0000 mg | ORAL_CAPSULE | ORAL | Status: AC
  Administered 2021-04-26: 200 mg via ORAL

## 2021-04-26 MED ORDER — SUGAMMADEX SODIUM 200 MG/2ML IV SOLN
INTRAVENOUS | Status: DC | PRN
  Administered 2021-04-26: 200 mg via INTRAVENOUS

## 2021-04-26 MED ORDER — MIDAZOLAM HCL 5 MG/5ML IJ SOLN
INTRAMUSCULAR | Status: DC | PRN
  Administered 2021-04-26: 2 mg via INTRAVENOUS

## 2021-04-26 MED ORDER — INSULIN ASPART 100 UNIT/ML IJ SOLN
4.0000 [IU] | Freq: Once | INTRAMUSCULAR | Status: AC
  Administered 2021-04-26: 4 [IU] via SUBCUTANEOUS

## 2021-04-26 MED ORDER — BUPIVACAINE-EPINEPHRINE 0.25% -1:200000 IJ SOLN
INTRAMUSCULAR | Status: DC | PRN
  Administered 2021-04-26: 20 mL

## 2021-04-26 MED ORDER — BUPIVACAINE LIPOSOME 1.3 % IJ SUSP: 20.0000 mL | Freq: Once | INTRAMUSCULAR | Status: DC

## 2021-04-26 MED ORDER — BUPIVACAINE LIPOSOME 1.3 % IJ SUSP
INTRAMUSCULAR | Status: DC | PRN
  Administered 2021-04-26: 20 mL

## 2021-04-26 MED ORDER — ACETAMINOPHEN 500 MG PO TABS
ORAL_TABLET | ORAL | Status: AC
  Filled 2021-04-26: qty 2

## 2021-04-26 MED ORDER — DEXAMETHASONE SODIUM PHOSPHATE 4 MG/ML IJ SOLN
INTRAMUSCULAR | Status: DC | PRN
  Administered 2021-04-26: 5 mg via INTRAVENOUS

## 2021-04-26 MED ORDER — FENTANYL CITRATE (PF) 100 MCG/2ML IJ SOLN
INTRAMUSCULAR | Status: DC | PRN
  Administered 2021-04-26: 50 ug via INTRAVENOUS
  Administered 2021-04-26 (×2): 100 ug via INTRAVENOUS

## 2021-04-26 MED ORDER — CELECOXIB 200 MG PO CAPS
ORAL_CAPSULE | ORAL | Status: AC
  Filled 2021-04-26: qty 1

## 2021-04-26 MED ORDER — DIBUCAINE (PERIANAL) 1 % EX OINT
TOPICAL_OINTMENT | CUTANEOUS | Status: DC | PRN
  Administered 2021-04-26: 1 via RECTAL

## 2021-04-26 MED ORDER — ACETAMINOPHEN 500 MG PO TABS
1000.0000 mg | ORAL_TABLET | ORAL | Status: AC
  Administered 2021-04-26: 1000 mg via ORAL

## 2021-04-26 MED ORDER — OXYCODONE HCL 5 MG/5ML PO SOLN: 5.0000 mg | Freq: Once | ORAL | Status: AC | PRN

## 2021-04-26 MED ORDER — FENTANYL CITRATE (PF) 250 MCG/5ML IJ SOLN
INTRAMUSCULAR | Status: AC
  Filled 2021-04-26: qty 5

## 2021-04-26 MED ORDER — GABAPENTIN 300 MG PO CAPS
300.0000 mg | ORAL_CAPSULE | ORAL | Status: AC
  Administered 2021-04-26: 300 mg via ORAL

## 2021-04-26 MED ORDER — METRONIDAZOLE 500 MG/100ML IV SOLN
500.0000 mg | INTRAVENOUS | Status: AC
  Administered 2021-04-26: 500 mg via INTRAVENOUS

## 2021-04-26 MED ORDER — PROPOFOL 10 MG/ML IV BOLUS
INTRAVENOUS | Status: AC
  Filled 2021-04-26: qty 20

## 2021-04-26 MED ORDER — OXYCODONE HCL 5 MG PO TABS
5.0000 mg | ORAL_TABLET | Freq: Once | ORAL | Status: AC | PRN
Start: 2021-04-26 — End: 2021-04-26
  Administered 2021-04-26: 5 mg via ORAL

## 2021-04-26 MED ORDER — SODIUM CHLORIDE 0.9 % IV SOLN
INTRAVENOUS | Status: DC
  Administered 2021-04-26: 1000 mL via INTRAVENOUS

## 2021-04-26 MED ORDER — LIDOCAINE HCL (CARDIAC) PF 100 MG/5ML IV SOSY
PREFILLED_SYRINGE | INTRAVENOUS | Status: DC | PRN
  Administered 2021-04-26: 100 mg via INTRAVENOUS

## 2021-04-26 MED ORDER — DIAZEPAM 5 MG PO TABS: 5.0000 mg | ORAL_TABLET | Freq: Four times a day (QID) | ORAL | 2 refills | Status: DC | PRN

## 2021-04-26 MED ORDER — PROPOFOL 10 MG/ML IV BOLUS
INTRAVENOUS | Status: DC | PRN
  Administered 2021-04-26: 200 mg via INTRAVENOUS

## 2021-04-26 SURGICAL SUPPLY — 63 items
APL SKNCLS STERI-STRIP NONHPOA (GAUZE/BANDAGES/DRESSINGS) ×2
BENZOIN TINCTURE PRP APPL 2/3 (GAUZE/BANDAGES/DRESSINGS) ×3 IMPLANT
BLADE HEX COATED 2.75 (ELECTRODE) IMPLANT
BLADE SURG 10 STRL SS (BLADE) IMPLANT
BLADE SURG 15 STRL LF DISP TIS (BLADE) ×2 IMPLANT
BLADE SURG 15 STRL SS (BLADE) ×3
BNDG GAUZE ELAST 4 BULKY (GAUZE/BANDAGES/DRESSINGS) ×3 IMPLANT
BRIEF STRETCH FOR OB PAD LRG (UNDERPADS AND DIAPERS) ×3 IMPLANT
CANISTER SUCT 1200ML W/VALVE (MISCELLANEOUS) IMPLANT
COVER BACK TABLE 60X90IN (DRAPES) ×3 IMPLANT
COVER MAYO STAND STRL (DRAPES) ×3 IMPLANT
COVER WAND RF STERILE (DRAPES) ×3 IMPLANT
DECANTER SPIKE VIAL GLASS SM (MISCELLANEOUS) ×3 IMPLANT
DRAPE HYSTEROSCOPY (MISCELLANEOUS) IMPLANT
DRAPE LAPAROTOMY 100X72 PEDS (DRAPES) ×3 IMPLANT
DRAPE SHEET LG 3/4 BI-LAMINATE (DRAPES) IMPLANT
DRSG PAD ABDOMINAL 8X10 ST (GAUZE/BANDAGES/DRESSINGS) ×3 IMPLANT
ELECT NEEDLE TIP 2.8 STRL (NEEDLE) IMPLANT
ELECT REM PT RETURN 9FT ADLT (ELECTROSURGICAL) ×3
ELECTRODE REM PT RTRN 9FT ADLT (ELECTROSURGICAL) ×2 IMPLANT
FILTER STRAW (MISCELLANEOUS) ×3 IMPLANT
GAUZE 4X4 16PLY RFD (DISPOSABLE) ×3 IMPLANT
GLOVE SRG 8 PF TXTR STRL LF DI (GLOVE) IMPLANT
GLOVE SURG ENC MOIS LTX SZ6.5 (GLOVE) ×3 IMPLANT
GLOVE SURG ENC MOIS LTX SZ7 (GLOVE) ×3 IMPLANT
GLOVE SURG LTX SZ8 (GLOVE) ×3 IMPLANT
GLOVE SURG ORTHO LTX SZ9 (GLOVE) ×3 IMPLANT
GLOVE SURG UNDER LTX SZ8 (GLOVE) ×3 IMPLANT
GLOVE SURG UNDER POLY LF SZ7 (GLOVE) ×9 IMPLANT
GLOVE SURG UNDER POLY LF SZ8 (GLOVE)
GOWN STRL REUS W/TWL LRG LVL3 (GOWN DISPOSABLE) ×6 IMPLANT
GOWN STRL REUS W/TWL XL LVL3 (GOWN DISPOSABLE) ×6 IMPLANT
IV CATH PLACEMENT 20 GA (IV SOLUTION) ×3 IMPLANT
KIT SIGMOIDOSCOPE (SET/KITS/TRAYS/PACK) IMPLANT
KIT TURNOVER CYSTO (KITS) ×3 IMPLANT
LEGGING LITHOTOMY PAIR STRL (DRAPES) IMPLANT
MANIFOLD NEPTUNE II (INSTRUMENTS) ×3 IMPLANT
NEEDLE HYPO 22GX1.5 SAFETY (NEEDLE) ×3 IMPLANT
NS IRRIG 500ML POUR BTL (IV SOLUTION) ×3 IMPLANT
PACK BASIN DAY SURGERY FS (CUSTOM PROCEDURE TRAY) ×3 IMPLANT
PAD PREP 24X48 CUFFED NSTRL (MISCELLANEOUS) IMPLANT
PENCIL SMOKE EVACUATOR (MISCELLANEOUS) ×3 IMPLANT
SCRUB TECHNI CARE 4 OZ NO DYE (MISCELLANEOUS) IMPLANT
SHEARS HARMONIC 9CM CVD (BLADE) IMPLANT
SURGILUBE 2OZ TUBE FLIPTOP (MISCELLANEOUS) ×3 IMPLANT
SUT CHROMIC 2 0 SH (SUTURE) IMPLANT
SUT CHROMIC 3 0 SH 27 (SUTURE) ×6 IMPLANT
SUT VIC AB 2-0 SH 27 (SUTURE)
SUT VIC AB 2-0 SH 27XBRD (SUTURE) IMPLANT
SUT VIC AB 2-0 UR6 27 (SUTURE) ×18 IMPLANT
SUT VICRYL 0 UR6 27IN ABS (SUTURE) IMPLANT
SUT VICRYL AB 2 0 TIE (SUTURE) IMPLANT
SUT VICRYL AB 2 0 TIES (SUTURE)
SYR 20ML LL LF (SYRINGE) ×3 IMPLANT
SYR 27GX1/2 1ML LL SAFETY (SYRINGE) IMPLANT
SYR BULB IRRIG 60ML STRL (SYRINGE) ×3 IMPLANT
SYR CONTROL 10ML LL (SYRINGE) IMPLANT
TAPE CLOTH 3X10 TAN LF (GAUZE/BANDAGES/DRESSINGS) ×3 IMPLANT
TOWEL OR 17X26 10 PK STRL BLUE (TOWEL DISPOSABLE) ×3 IMPLANT
TRAY DSU PREP LF (CUSTOM PROCEDURE TRAY) ×3 IMPLANT
TUBE CONNECTING 12X1/4 (SUCTIONS) ×3 IMPLANT
UNDERPAD 30X36 HEAVY ABSORB (UNDERPADS AND DIAPERS) ×3 IMPLANT
YANKAUER SUCT BULB TIP NO VENT (SUCTIONS) ×3 IMPLANT

## 2021-04-26 NOTE — Anesthesia Postprocedure Evaluation (Signed)
Anesthesia Post Note  Patient: Stacy Hull  Procedure(s) Performed: EXAM UNDER ANESTHESIA  WITH HEMORRHOIDECTOMY; HEMORRHOIDPEXY AND LIGATION (N/A Rectum)     Patient location during evaluation: PACU Anesthesia Type: General Level of consciousness: awake and alert Pain management: pain level controlled Vital Signs Assessment: post-procedure vital signs reviewed and stable Respiratory status: spontaneous breathing, nonlabored ventilation, respiratory function stable and patient connected to nasal cannula oxygen Cardiovascular status: blood pressure returned to baseline and stable Postop Assessment: no apparent nausea or vomiting Anesthetic complications: no   No complications documented.  Last Vitals:  Vitals:   04/26/21 1500 04/26/21 1515  BP: (!) 162/101 (!) 165/101  Pulse: 99 94  Resp: (!) 21 17  Temp:    SpO2: 100% 96%    Last Pain:  Vitals:   04/26/21 1515  TempSrc:   PainSc: 0-No pain                 Danielle Mink S

## 2021-04-26 NOTE — Interval H&P Note (Signed)
History and Physical Interval Note:  04/26/2021 10:09 AM  Stacy Hull  has presented today for surgery, with the diagnosis of ANAL PAIN. PROBABLE FISSURE REFRACTORY TO MEDICAL MANAGEMENT.  The various methods of treatment have been discussed with the patient and family. After consideration of risks, benefits and other options for treatment, the patient has consented to  Procedure(s): ANAL SPHINCTEROTOMY (N/A) EXAM UNDER ANESTHESIA WITH POSSIBLE HEMORRHOIDECTOMY (N/A) as a surgical intervention.  The patient's history has been reviewed, patient examined, no change in status, stable for surgery.  I have reviewed the patient's chart and labs.  Questions were answered to the patient's satisfaction.    ANORECTAL SURGERY:  POST OPERATIVE INSTRUCTIONS  ######################################################################  EAT Start with a pureed / full liquid diet After 24 hours, gradually transition to a high fiber diet.    CONTROL PAIN Control pain so you can tolerate bowel movements,  walk, sleep, tolerate sneezing/coughing, and go up/down stairs.   HAVE A BOWEL MOVEMENT DAILY Keep your bowels regular to avoid problems.   Taking a fiber supplement every day to keep bowels soft.   Try a laxative to override constipation. Use an antidairrheal to slow down diarrhea.   Call if not better after 2 tries  WALK Walk an hour a day.  Control your pain to do that.   CALL IF YOU HAVE PROBLEMS/CONCERNS Call if you are still struggling despite following these instructions. Call if you have concerns not answered by these instructions  ######################################################################    Take your usually prescribed home medications unless otherwise directed.  DIET: Follow a light bland diet & liquids the first 24 hours after arrival home, such as soup, liquids, starches, etc.  Be sure to drink plenty of fluids.  Quickly advance to a usual solid diet within a few  days.  Avoid fast food or heavy meals as your are more likely to get nauseated or have irregular bowels.  A low-fat, high-fiber diet for the rest of your life is ideal.  PAIN CONTROL: Pain is best controlled by a usual combination of three different methods TOGETHER: Ice/Heat Over the counter pain medication Prescription pain medication Expect swelling and discomfort in the anus/rectal area.  Warm water baths (30-60 minutes up to 6 times a day, especially after bowel meovements) will help. Use ice for the first few days to help decrease swelling and bruising, then switch to heat such as warm towels, sitz baths, warm baths, etc to help relax tight/sore spots and speed recovery.  Some people prefer to use ice alone, heat alone, alternating between ice & heat.  Experiment to what works for you.   It is helpful to take an over-the-counter pain medication continuously for the first few weeks.  Choose one of the following that works best for you: Naproxen (Aleve, etc)  Two 278m tabs twice a day Ibuprofen (Advil, etc) Three 2071mtabs four times a day (every meal & bedtime) Acetaminophen (Tylenol, etc) 500-65062mour times a day (every meal & bedtime) A  prescription for pain medication (such as oxycodone, hydrocodone, etc) should be given to you upon discharge.  Take your pain medication as prescribed.  If you are having problems/concerns with the prescription medicine (does not control pain, nausea, vomiting, rash, itching, etc), please call us [phone] see if we need to switch you to a different pain medicine that will work better for you and/or control your side effect better. If you need a refill on your pain medication, please contact your pharmacy.  They will contact our office to request authorization. Prescriptions will not be filled after 5 pm or on week-ends.  If can take up to 48 hours for it to be filled & ready so avoid waiting until you are down to thel ast pill. A topical cream  (Dibucaine) or a prescription for a cream (such as diltiazem 2% gel) may be given to you.  Many people find relief with topical creams.  Some people find it burns too much.  Experiment.  If it helps, use it.  If it burns, don't using it.  Use a Sitz Bath 4-8 times a day for relief   [employer] A sitz bath is a warm water bath taken in the sitting position that covers only the hips and buttocks. It may be used for either healing or hygiene purposes. Sitz baths are also used to relieve pain, itching, or muscle spasms. The water may contain medicine. Moist heat will help you heal and relax.  HOME CARE INSTRUCTIONS  Take 3 to 4 sitz baths a day. Fill the bathtub half full with warm water. Sit in the water and open the drain a little. Turn on the warm water to keep the tub half full. Keep the water running constantly. Soak in the water for 15 to 20 minutes. After the sitz bath, pat the affected area dry first.   KEEP YOUR BOWELS REGULAR The goal is one soft bowel movement a day Avoid getting constipated.  Between the surgery and the pain medications, it is common to experience some constipation.  Increasing fluid intake and taking a fiber supplement (such as Metamucil, Citrucel, FiberCon, MiraLax, etc) 2-3 times a day regularly will usually help prevent this problem from occurring.  A mild laxative (prune juice, Milk of Magnesia, MiraLax, etc) should be taken according to package directions if there are no bowel movements after 48 hours. Watch out for diarrhea.  If you have many loose bowel movements, simplify your diet to bland foods & liquids for a few days.  Stop any stool softeners and decrease your fiber supplement.  Switching to mild anti-diarrheal medications (Kayopectate, Pepto Bismol) can help.  Can try an imodium/loperamide dose.  If this worsens or does not improve, please call us.  Wound Care  Remove your bandages with your first bowel movement, usually the day after surgery.  Let the  gauze fall off with the first bowel movement or shower.   Wear an absorbent pad or soft cotton balls in your underwear as needed to catch any drainage and help keep the area  Keep the area clean and dry.  Bathe / shower every day.  Keep the area clean by showering / bathing over the incision / wound.   It is okay to soak an open wound to help wash it.  Consider using a squeeze bottle filled with warm water to gently wash the anal area.  Wet wipes or showers / gentle washing after bowel movements is often less traumatic than regular toilet paper. You will often notice bleeding with bowel movements.  This should slow down by the end of the first week of surgery.  Sitting on an ice pack can help. Expect some drainage.  This should slow down by the end of the first week of surgery, but you will have occasional bleeding or drainage up to a few months after surgery.  Wear an absorbent pad or soft cotton gauze in your underwear until the drainage stops.  ACTIVITIES as tolerated:   You  may resume regular (light) daily activities beginning the next day--such as daily self-care, walking, climbing stairs--gradually increasing activities as tolerated.  If you can walk 30 minutes without difficulty, it is safe to try more intense activity such as jogging, treadmill, bicycling, low-impact aerobics, swimming, etc. Save the most intensive and strenuous activity for last such as sit-ups, heavy lifting, contact sports, etc  Refrain from any heavy lifting or straining until you are off narcotics for pain control.   DO NOT PUSH THROUGH PAIN.  Let pain be your guide: If it hurts to do something, don't do it.  Pain is your body warning you to avoid that activity for another week until the pain goes down. You may drive when you are no longer taking prescription pain medication, you can comfortably sit for long periods of time, and you can safely maneuver your car and apply brakes. You may have sexual intercourse when it is  comfortable.  FOLLOW UP in our office Please call [facility] at [phone] to set up an appointment to see your surgeon in the office for a follow-up appointment approximately 2-3 weeks after your surgery. Make sure that you call for this appointment the day you arrive home to ensure a convenient appointment time.  8. IF YOU HAVE DISABILITY OR FAMILY LEAVE FORMS, BRING THEM TO THE OFFICE FOR PROCESSING.  DO NOT GIVE THEM TO YOUR DOCTOR.        WHEN TO CALL us [phone]: Poor pain control Reactions / problems with new medications (rash/itching, nausea, etc)  Fever over 101.5 F (38.5 C) Inability to urinate Nausea and/or vomiting Worsening swelling or bruising Continued bleeding from incision. Increased pain, redness, or drainage from the incision  The clinic staff is available to answer your questions during regular business hours (8:30am-5pm).  Please don't hesitate to call and ask to speak to one of our nurses for clinical concerns.   A surgeon from [facility] is always on call at the hospitals   If you have a medical emergency, go to the nearest emergency room or call 911.    [facility], [address], [place], [place], [place]  [address] ? MAIN: [phone] ? TOLL FREE: [phone] ? FAX [phone] [email or web address]    Adin Hector

## 2021-04-26 NOTE — Anesthesia Preprocedure Evaluation (Signed)
Anesthesia Evaluation  Patient identified by MRN, date of birth, ID band Patient awake    Reviewed: Allergy & Precautions, NPO status , Patient's Chart, lab work & pertinent test results  Airway Mallampati: II  TM Distance: >3 FB Neck ROM: Full    Dental no notable dental hx.    Pulmonary asthma ,    Pulmonary exam normal breath sounds clear to auscultation       Cardiovascular hypertension, Pt. on medications Normal cardiovascular exam Rhythm:Regular Rate:Normal     Neuro/Psych Anxiety negative neurological ROS     GI/Hepatic negative GI ROS, Neg liver ROS,   Endo/Other  diabetes  Renal/GU negative Renal ROS  negative genitourinary   Musculoskeletal negative musculoskeletal ROS (+)   Abdominal   Peds negative pediatric ROS (+)  Hematology negative hematology ROS (+)   Anesthesia Other Findings   Reproductive/Obstetrics negative OB ROS                             Anesthesia Physical Anesthesia Plan  ASA: II  Anesthesia Plan: General   Post-op Pain Management:    Induction: Intravenous  PONV Risk Score and Plan: 3 and Ondansetron, Dexamethasone, Droperidol, Midazolam and Treatment may vary due to age or medical condition  Airway Management Planned: Oral ETT  Additional Equipment:   Intra-op Plan:   Post-operative Plan: Extubation in OR  Informed Consent: I have reviewed the patients History and Physical, chart, labs and discussed the procedure including the risks, benefits and alternatives for the proposed anesthesia with the patient or authorized representative who has indicated his/her understanding and acceptance.     Dental advisory given  Plan Discussed with: CRNA and Surgeon  Anesthesia Plan Comments:         Anesthesia Quick Evaluation

## 2021-04-26 NOTE — Transfer of Care (Signed)
Immediate Anesthesia Transfer of Care Note  Patient: Stacy Hull  Procedure(s) Performed: Jasmine December UNDER ANESTHESIA  WITH HEMORRHOIDECTOMY; HEMORRHOIDPEXY AND LIGATION (N/A Rectum)  Patient Location: PACU  Anesthesia Type:General  Level of Consciousness: awake, alert  and oriented  Airway & Oxygen Therapy: Patient Spontanous Breathing and Patient connected to nasal cannula oxygen  Post-op Assessment: Report given to RN and Post -op Vital signs reviewed and stable  Post vital signs: Reviewed and stable  Last Vitals:  Vitals Value Taken Time  BP 150/86 04/26/21 1442  Temp 37.1 C 04/26/21 1443  Pulse 108 04/26/21 1445  Resp 39 04/26/21 1445  SpO2 99 % 04/26/21 1445  Vitals shown include unvalidated device data.  Last Pain:  Vitals:   04/26/21 0954  TempSrc: Oral  PainSc: 1       Patients Stated Pain Goal: 5 (93/90/30 0923)  Complications: No complications documented.

## 2021-04-26 NOTE — Op Note (Signed)
04/26/2021  2:39 PM  PATIENT:  Stacy Hull  50 y.o. female  Patient Care Team: Wendie Agreste, MD as PCP - General (Family Medicine) Juanita Craver, MD as Consulting Physician (Gastroenterology) Servando Salina, MD as Consulting Physician (Obstetrics and Gynecology) Michael Boston, MD as Consulting Physician (General Surgery) Wilford Corner, MD as Consulting Physician (Gastroenterology)  PRE-OPERATIVE DIAGNOSIS:  ANAL PAIN. PROBABLE FISSURE REFRACTORY TO MEDICAL MANAGEMENT  POST-OPERATIVE DIAGNOSIS:   ACUTE PROCTITIS GRADE 3 PROLAPSING INTERNAL HEMORRHOIDS  PROCEDURE:   HEMORRHOIDECTOMY X 2 HEMORRHOIDPEXY AND LIGATION EXCISION OF ANAL TAGS X 2 ANORECTAL EXAM UNDER ANESTHESIA    SURGEON:  Adin Hector, MD  ASSIST:  Wynelle Link, MD, [facility], PGY-6  ANESTHESIA:   General Anorectal & Local field block (0.25% bupivacaine with epinephrine mixed with Liposomal bupivacaine (Experel)   EBL:  Total I/O In: 1000 [I.V.:1000] Out: 100 [Blood:100].  See operative record  Delay start of Pharmacological VTE agent (>24hrs) due to surgical blood loss or risk of bleeding:  NO  DRAINS: NONE  SPECIMEN: Internal hemorrhoid x2  Anal tags x 2  DISPOSITION OF SPECIMEN:  PATHOLOGY  COUNTS:  YES  PLAN OF CARE: Discharge home after PACU  PATIENT DISPOSITION:  PACU - hemodynamically stable.  INDICATION: Pleasant patient with struggles with hemorrhoids & anal pain.  Concern for possible anal fissure.  Only partial relief with topical nitroglycerin and diltiazem cream.  Aggressive effort for fiber bowel regimen as well as soaks.  Had colonoscopy that revealed a few simple serrated polyps and some focal ileitis not consistent with Crohn's/inflammatory bowel disease.  Because of her pain and symptoms I offered examination under anesthesia and surgical treatment:  The anatomy & physiology of the anorectal region was discussed.  The pathophysiology of hemorrhoids and  differential diagnosis was discussed.  Natural history risks without surgery was discussed.   I stressed the importance of a bowel regimen to have daily soft bowel movements to minimize progression of disease.  Interventions such as sclerotherapy & banding were discussed.  The patient's symptoms are not adequately controlled by medicines and other non-operative treatments.  I feel the risks & problems of no surgery outweigh the operative risks; therefore, I recommended surgery to treat the hemorrhoids by ligation, pexy, and possible resection.  Risks such as bleeding, infection, need for further treatment, heart attack, death, and other risks were discussed.   I noted a good likelihood this will help address the problem.  Goals of post-operative recovery were discussed as well.  Possibility that this will not correct all symptoms was explained.  Post-operative pain, bleeding, constipation, urinary difficulties, and other problems after surgery were discussed.  We will work to minimize complications.   Educational handouts further explaining the pathology, treatment options, and bowel regimen were given as well.  Questions were answered.  The patient expresses understanding & wishes to proceed with surgery.  OR FINDINGS: Friable inflamed rectum concerning for proctitis.  No fistulous disease.  No anal fissures.  Inflamed internal hemorrhoids especially right posterior and right anterior at least grade 2 probable grade 3 with some prolapse.  Some right-sided external hemorrhoidal tags as well.  Hemorrhoidal ligation and pexy done.  Hemorrhoidectomy x2 internal hemorrhoids for pathology to rule out any inflammatory bowel disease or other concerns.  Normal sphincter tone.  No condyloma.  No pilonidal disease.  No fistulas.  DESCRIPTION:   Informed consent was confirmed. Patient underwent general anesthesia without difficulty. Patient was placed into prone positioning.  The perianal region was prepped  and  draped in sterile fashion. Surgical time-out confirmed our plan.  I did digital rectal examination and then transitioned over to anoscopy to get a sense of the anatomy.  Findings noted above.  Her history had been suspicious for an anal fissure but there was no evidence of any anal fissure.  No scarring suspicion of old anal fissure.  She seemed to have general circumferential prolapse especially the hemorrhoidal piles with very friable hemorrhoids in rectum overall.  We proceeded to do hemorrhoidal ligation and pexy.  I used a 2-0 Vicryl suture on a UR-6 needle in a figure-of-eight fashion 6 cm proximal to the anal verge.  I started at the largest hemorrhoid pile.  Because of redundant hemorrhoidal tissue too bulky to merely ligate or pexy, I excised the excess internal hemorrhoid piles longitudinally in a fusiform biconcave fashion, sparing the anal canal to avoid narrowing.  I then ran that stitch longitudinally more distally to close the hemorrhoidectomy wound to the anal verge over a large Parks self-retaining anal retractor to avoid narrowing of the anal canal.  I then tied that stitch down to cause a hemorrhoidopexy.   We also had to do an excision at the  right anterior and right posterior pile locations.  We also did this to send hemorrhoid and rectal tissue to rule out other etiologies for bleeding besides hemorrhoidal irritation.  I then did hemorrhoidal ligation and pexy at the other 4 hemorrhoidal columns.  At the completion of this, all 6 anorectal columns were ligated and pexied in the classic hexagonal fashion (right anterior/lateral/posterior, left anterior/lateral/posterior).  I closed the external part of the hemorrhoidectomy wounds with radial interrupted horizontal mattress 2-0 chromic suture over a large Sawyer anal retractor, leaving the last 5 mm open to allow natural drainage.    I redid anoscopy & examination.  At completion of this, all hemorrhoids had been removed or reduced into  the rectum.  There is no more prolapse.  Internal & external anatomy was more more normal.  Hemostasis was good.  Fluffed gauze was on-laid over the perianal region.  No packing done.  Patient is being extubated go to go to the recovery room.  I had discussed postop care in detail with the patient in the preop holding area.  Instructions for post-operative recovery and prescriptions are written. I discussed operative findings, updated the patient's status, discussed probable steps to recovery, and gave postoperative recommendations to the patient's spouse, Marnita Poirier.   Recommendations were made.  Questions were answered.  He expressed understanding & appreciation.  Adin Hector, M.D., F.A.C.S. Gastrointestinal and Minimally Invasive Surgery [facility] [address], [place], [place] [address] [phone] Main / Paging

## 2021-04-26 NOTE — Discharge Instructions (Signed)

## 2021-04-26 NOTE — Anesthesia Procedure Notes (Signed)
Procedure Name: Intubation Date/Time: 04/26/2021 1:03 PM Performed by: Bufford Spikes, CRNA Pre-anesthesia Checklist: Patient identified, Emergency Drugs available, Suction available and Patient being monitored Patient Re-evaluated:Patient Re-evaluated prior to induction Oxygen Delivery Method: Circle system utilized Preoxygenation: Pre-oxygenation with 100% oxygen Induction Type: IV induction Ventilation: Mask ventilation without difficulty Laryngoscope Size: Miller and 2 Tube type: Oral Tube size: 7.0 mm Number of attempts: 1 Airway Equipment and Method: Stylet Placement Confirmation: ETT inserted through vocal cords under direct vision,  positive ETCO2 and breath sounds checked- equal and bilateral Secured at: 21 cm Tube secured with: Tape Dental Injury: Teeth and Oropharynx as per pre-operative assessment

## 2021-04-26 NOTE — H&P (Signed)
Stacy Hull DOB: 10-30-1971   Patient Care Team: Wendie Agreste, MD as PCP - General (Family Medicine) Juanita Craver, MD as Consulting Physician (Gastroenterology) Servando Salina, MD as Consulting Physician (Obstetrics and Gynecology) Marland Kitchen Patient sent for surgical consultation at the request of Carlena Hurl  Chief Complaint: Anal pains with fissure versus symptomatic hemorrhoids. ` `   The past for hernia repair by Dr. Rosendo Gros 6 years ago. Recently had some anal pain and discomfort. Significant external hemorrhoids. History suspicious for fissure. Prescribed some lidocaine cream. Recommended colorectal follow-up. Patient notes she's been struggling with hemorrhoids ever since she had her son who is now 24 years old. She usually has about 2 bowel movements a day. She works as a Public relations account executive. She's getting increasing episodes of significant rectal bleeding after bowel movements and pain. She's try to soften her stools. She wonders if she has an irritable bowel type picture since she can get diarrhea with stress. A lot of cramping as well. She usually tries to Rideout flares with creams and warm soaks. Heart to do the work. She's had stool softeners to help soften her stool. Still struggled with intermittent significant bleeding. She saw [facility] gastroenterology. Dr. Michail Sermon is planning colonoscopy later this month to rule out other sources of bleeding.  She [place] office urgently. History suspicious for anal fissure. Patient had tried diltiazem cream and did not know if that helps. She was offered a nitroglycerin base cream. She notes that the lidocaine 5% ointment helps her right out her hemorrhoid/anal pain and work better. She wanted a refill. She's been quite miserable and is hoping to get something done to more definitively deal with this problem.  No personal nor family history of GI/colon cancer, inflammatory bowel disease, irritable bowel  syndrome, allergy such as Celiac Sprue, dietary/dairy problems, colitis, ulcers nor gastritis. No recent sick contacts/gastroenteritis. No travel outside the country. No changes in diet. No dysphagia to solids or liquids. No significant heartburn or reflux. No melena, hematemesis, coffee ground emesis. No evidence of prior gastric/peptic ulceration.  (Review of systems as stated in this history (HPI) or in the review of systems. Otherwise all other 12 point ROS are negative) ` ` ###########################################`  This patient encounter took 35 minutes today to perform the following: obtain history, perform exam, review outside records, interpret tests & imaging, counsel the patient on their diagnosis; and, document this encounter, including findings & plan in the electronic health record (EHR).   Problem List/Past Medical Adin Hector, MD; 03/11/2021 5:10 PM) POSTOPERATIVE STATE (Z98.890) DIASTASIS RECTI (M62.08) ABDOMINAL HERNIA WITHOUT OBSTRUCTION AND WITHOUT GANGRENE, RECURRENCE NOT SPECIFIED, UNSPECIFIED HERNIA TYPE (K46.9) ANAL PAIN (K62.89) ENCOUNTER FOR PREOPERATIVE EXAMINATION FOR GENERAL SURGICAL PROCEDURE (Z01.818) EXTERNAL HEMORRHOIDS WITH COMPLICATION (O12.2)  Allergies Janeann Forehand, CNA; 03/11/2021 4:24 PM) Erythromycin *MACROLIDES* Allergies Reconciled  Medication History Janeann Forehand, CNA; 03/11/2021 4:24 PM) Percocet (5-325MG Tablet, 1-2 Tablet Oral q 4 hours, Taken starting 12/08/2014) Active. Lidocaine (5% Ointment, 1 (one) External apply to area 3 times daily, as needed, Taken starting 02/21/2021) Active. Lisinopril-Hydrochlorothiazide (20-12.5MG Tablet, Oral) Active. Albuterol Sulfate (108 (90 Base)MCG/ACT Aero Pow Br Act, Inhalation as needed) Active. Drisdol (50000UNIT Capsule, Oral once a week) Active. Hydrocodone-Acetaminophen (5-325MG Tablet, Oral as needed) Active. Lisinopril-Hydrochlorothiazide (20-25MG Tablet,  Oral daily) Active. Tranexamic Acid (650MG Tablet, Oral as needed) Active. (For heavy menstrual flow.) ALPRAZolam (0.5MG Tablet, Oral three times daily) Active. Citalopram Hydrobromide (20MG Tablet, Oral daily) Active. Multivitamin Adults 50+ (Oral daily) Active. Medications Reconciled  BP (!) 157/100   Pulse 84   Temp 99 F (37.2 C) (Oral)   Resp 18   Ht 5' 7"  (1.702 m)   Wt 90.4 kg   LMP 11/03/2016 (Exact Date)   SpO2 97%   BMI 31.23 kg/m  04/26/2021    Physical Exam   General Mental Status-Alert. General Appearance-Not in acute distress, Not Sickly. Orientation-Oriented X3. Hydration-Well hydrated. Voice-Normal.  Integumentary Global Assessment Upon inspection and palpation of skin surfaces of the - Axillae: non-tender, no inflammation or ulceration, no drainage. and Distribution of scalp and body hair is normal. General Characteristics Temperature - normal warmth is noted.  Head and Neck Head-normocephalic, atraumatic with no lesions or palpable masses. Face Global Assessment - atraumatic, no absence of expression. Neck Global Assessment - no abnormal movements, no bruit auscultated on the right, no bruit auscultated on the left, no decreased range of motion, non-tender. Trachea-midline. Thyroid Gland Characteristics - non-tender.  Eye Eyeball - Left-Extraocular movements intact, No Nystagmus - Left. Eyeball - Right-Extraocular movements intact, No Nystagmus - Right. Cornea - Left-No Hazy - Left. Cornea - Right-No Hazy - Right. Sclera/Conjunctiva - Left-No scleral icterus, No Discharge - Left. Sclera/Conjunctiva - Right-No scleral icterus, No Discharge - Right. Pupil - Left-Direct reaction to light normal. Pupil - Right-Direct reaction to light normal.  ENMT Ears Pinna - Left - no drainage observed, no generalized tenderness observed. Pinna - Right - no drainage observed, no generalized tenderness observed. Nose  and Sinuses External Inspection of the Nose - no destructive lesion observed. Inspection of the nares - Left - quiet respiration. Inspection of the nares - Right - quiet respiration. Mouth and Throat Lips - Upper Lip - no fissures observed, no pallor noted. Lower Lip - no fissures observed, no pallor noted. Nasopharynx - no discharge present. Oral Cavity/Oropharynx - Tongue - no dryness observed. Oral Mucosa - no cyanosis observed. Hypopharynx - no evidence of airway distress observed.  Chest and Lung Exam Inspection Movements - Normal and Symmetrical. Accessory muscles - No use of accessory muscles in breathing. Palpation Palpation of the chest reveals - Non-tender. Auscultation Breath sounds - Normal and Clear.  Cardiovascular Auscultation Rhythm - Regular. Murmurs & Other Heart Sounds - Auscultation of the heart reveals - No Murmurs and No Systolic Clicks.  Abdomen Inspection Inspection of the abdomen reveals - No Visible peristalsis and No Abnormal pulsations. Umbilicus - No Bleeding, No Urine drainage. Palpation/Percussion Palpation and Percussion of the abdomen reveal - Soft, Non Tender, No Rebound tenderness, No Rigidity (guarding) and No Cutaneous hyperesthesia. Note: Abdomen obese but soft. Some supraumbilical diastases with no recurrent hernia obvious. Soft. Not severely distended. No umbilical or other anterior abdominal wall hernias  Female Genitourinary Sexual Maturity Tanner 5 - Adult hair pattern. Note: No vaginal bleeding nor discharge. No inguinal lymphadenopathy. No inguinal hernias.  Rectal Note: ######################################   Perianal skin clear with some tags. Right posterior and anterior midline anal tags/hemorrhoids. Increased sphincter tone. I cannot feel an obvious fissure in the anal canal but I cannot get my finger all the up due to severe pain and discomfort. Seems more discomfort posterior midline. Anterior midline anal canal  and softer. I feel no obvious abscess nor do I see if fistula. No warts or pilonidal disease.  Clinically suspicious for fissure but no obvious one seen/felt.  Peripheral Vascular Upper Extremity Inspection - Left - No Cyanotic nailbeds - Left, Not Ischemic. Inspection - Right - No Cyanotic nailbeds - Right, Not Ischemic.  Neurologic Neurologic  evaluation reveals -normal attention span and ability to concentrate, able to name objects and repeat phrases. Appropriate fund of knowledge , normal sensation and normal coordination. Mental Status Affect - not angry, not paranoid. Cranial Nerves-Normal Bilaterally. Gait-Normal.  Neuropsychiatric Mental status exam performed with findings of-able to articulate well with normal speech/language, rate, volume and coherence, thought content normal with ability to perform basic computations and apply abstract reasoning and no evidence of hallucinations, delusions, obsessions or homicidal/suicidal ideation.  Musculoskeletal Global Assessment Spine, Ribs and Pelvis - no instability, subluxation or laxity. Right Upper Extremity - no instability, subluxation or laxity.  Lymphatic Head & Neck  General Head & Neck Lymphatics: Bilateral - Description - No Localized lymphadenopathy. Axillary  General Axillary Region: Bilateral - Description - No Localized lymphadenopathy. Femoral & Inguinal  Generalized Femoral & Inguinal Lymphatics: Left - Description - No Localized lymphadenopathy. Right - Description - No Localized lymphadenopathy.    Assessment & Plan   ANAL PAIN (K62.89) Impression: Patient struggling with severe anal pain. Suspicious for fissure but also has significant bleeding which region suspicion of hemorrhoids. No obvious fissure on exam. She's had persistent recurrent symptoms.  I offered a trial diltiazem 2 least tighter over but ultimately I think she needs examination under anesthesia to sort out was going on.  I suspect she will need internal hemorrhoidal ligation/Paxene hemorrhoidectomy and possible partial internal sphincterotomy depending on what we find. Should be an outpatient surgery. She is quite miserable with this but is hoping she can ride this out until after the end of the school year later next month  I cautioned that several weeks to recover before returning to work. She wants to time this in next month after I school is over. 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 We wrote a letter to allowher to have more frequent breaks until she can get her surgery.  With rectal bleeding, it makes sense to at some point consider colonoscopy. Sounds like she's going to have this happen next week by Dr. Michail Sermon with [facility]. Most likely the bowel prep will be challenging to say the least. We will see.   EXTERNAL HEMORRHOIDS WITH COMPLICATION (R67.8)  Current Plans Pt Education - [facility] Hemorrhoids (Harlan Vinal): discussed with patient and provided information. Pt Education - Pamphlet Given - The Hemorrhoid Book: discussed with patient and provided information.    The anatomy & physiology of the anorectal region was discussed. The pathophysiology of anal fissure and differential diagnosis was discussed. Natural history progression was discussed. I stressed the importance of a bowel regimen to have daily soft bowel movements to minimize progression of disease.  The patient's condition is not adequately controlled. Non-operative treatment has not healed the fissure. Therefore, I recommended examination under anesthesia for better examination to confirm the diagnosis and treat by lateral internal sphincterotomy to relax the spasm better & allow the fissure to heal. Technique, benefits, alternatives were discussed. I noted a good likelihood this will help address the problem. Risks such as bleeding, pain, incontinence, recurrence, heart attack, death, and other risks were  discussed.  Educational handouts further explaining the pathology, treatment options, and bowel regimen were given as well. The patient expressed understanding & wishes to proceed with surgery.  The anatomy & physiology of the anorectal region was discussed. The pathophysiology of hemorrhoids and differential diagnosis was discussed. Natural history risks without surgery was discussed. I stressed the importance of a bowel regimen to have daily soft bowel movements to minimize progression of disease.  Interventions such as sclerotherapy & banding were discussed.  The patient's symptoms are not adequately controlled by medicines and other non-operative treatments. I feel the risks & problems of no surgery outweigh the operative risks; therefore, I recommended surgery to treat the hemorrhoids by ligation, pexy, and possible resection.  Risks such as bleeding, infection, urinary difficulties, need for further treatment, heart attack, death, and other risks were discussed. I noted a good likelihood this will help address the problem. Goals of post-operative recovery were discussed as well. Possibility that this will not correct all symptoms was explained. Post-operative pain, bleeding, constipation, and other problems after surgery were discussed. We will work to minimize complications. Educational handouts further explaining the pathology, treatment options, and bowel regimen were given as well. Questions were answered. The patient expresses understanding & wishes to proceed with surgery  Adin Hector, MD, FACS, MASCRS  Esophageal, Gastrointestinal & Colorectal Surgery Robotic and Minimally Invasive Surgery [facility] [address] [place], [place] [address] [phone] Fax [phone] Main/Paging  CONTACT INFORMATION: Weekday (9AM-5PM) concerns: Call [facility] main office at [phone] Weeknight (5PM-9AM) or Weekend/Holiday concerns: Check  [email or web address] for General Surgery [facility] coverage (Please, do not use SecureChat as it is not reliable communication to operating surgeons for immediate patient care)

## 2021-04-30 LAB — SURGICAL PATHOLOGY

## 2021-05-01 ENCOUNTER — Encounter (HOSPITAL_BASED_OUTPATIENT_CLINIC_OR_DEPARTMENT_OTHER): Payer: Self-pay | Admitting: Surgery

## 2021-05-03 ENCOUNTER — Other Ambulatory Visit: Payer: Self-pay | Admitting: Family Medicine

## 2021-05-03 DIAGNOSIS — F411 Generalized anxiety disorder: Secondary | ICD-10-CM

## 2021-05-03 NOTE — Telephone Encounter (Signed)
Controlled substance database (PDMP) reviewed. No concerns appreciated.  Refilled.

## 2021-05-03 NOTE — Telephone Encounter (Signed)
LFD 03/28/21 #30 with no refills LOV 03/28/21 NOV none

## 2021-05-07 ENCOUNTER — Ambulatory Visit (INDEPENDENT_AMBULATORY_CARE_PROVIDER_SITE_OTHER): Payer: BC Managed Care – PPO | Admitting: Psychology

## 2021-05-07 DIAGNOSIS — F4323 Adjustment disorder with mixed anxiety and depressed mood: Secondary | ICD-10-CM

## 2021-05-16 ENCOUNTER — Ambulatory Visit (INDEPENDENT_AMBULATORY_CARE_PROVIDER_SITE_OTHER): Payer: BC Managed Care – PPO | Admitting: Psychology

## 2021-05-16 DIAGNOSIS — F4323 Adjustment disorder with mixed anxiety and depressed mood: Secondary | ICD-10-CM

## 2021-05-30 ENCOUNTER — Ambulatory Visit (INDEPENDENT_AMBULATORY_CARE_PROVIDER_SITE_OTHER): Payer: BC Managed Care – PPO | Admitting: Psychology

## 2021-05-30 DIAGNOSIS — F4323 Adjustment disorder with mixed anxiety and depressed mood: Secondary | ICD-10-CM | POA: Diagnosis not present

## 2021-06-11 ENCOUNTER — Ambulatory Visit (INDEPENDENT_AMBULATORY_CARE_PROVIDER_SITE_OTHER): Payer: BC Managed Care – PPO | Admitting: Psychology

## 2021-06-11 DIAGNOSIS — F4323 Adjustment disorder with mixed anxiety and depressed mood: Secondary | ICD-10-CM

## 2021-06-25 ENCOUNTER — Ambulatory Visit (INDEPENDENT_AMBULATORY_CARE_PROVIDER_SITE_OTHER): Payer: BC Managed Care – PPO | Admitting: Psychology

## 2021-06-25 DIAGNOSIS — F4323 Adjustment disorder with mixed anxiety and depressed mood: Secondary | ICD-10-CM | POA: Diagnosis not present

## 2021-07-10 ENCOUNTER — Ambulatory Visit (INDEPENDENT_AMBULATORY_CARE_PROVIDER_SITE_OTHER): Payer: BC Managed Care – PPO | Admitting: Psychology

## 2021-07-10 DIAGNOSIS — F4323 Adjustment disorder with mixed anxiety and depressed mood: Secondary | ICD-10-CM | POA: Diagnosis not present

## 2021-07-19 ENCOUNTER — Other Ambulatory Visit: Payer: Self-pay | Admitting: Family Medicine

## 2021-08-12 ENCOUNTER — Ambulatory Visit: Payer: BC Managed Care – PPO | Admitting: Psychology

## 2021-08-16 ENCOUNTER — Other Ambulatory Visit: Payer: Self-pay | Admitting: Registered Nurse

## 2021-08-16 DIAGNOSIS — S0502XA Injury of conjunctiva and corneal abrasion without foreign body, left eye, initial encounter: Secondary | ICD-10-CM

## 2021-08-19 ENCOUNTER — Other Ambulatory Visit: Payer: Self-pay

## 2021-08-19 ENCOUNTER — Telehealth: Payer: Self-pay

## 2021-08-19 DIAGNOSIS — F411 Generalized anxiety disorder: Secondary | ICD-10-CM

## 2021-08-19 DIAGNOSIS — F430 Acute stress reaction: Secondary | ICD-10-CM

## 2021-08-19 MED ORDER — CITALOPRAM HYDROBROMIDE 40 MG PO TABS: ORAL_TABLET | ORAL | 2 refills | Status: DC

## 2021-08-19 NOTE — Telephone Encounter (Signed)
Rx filled and sent to patient pharmacy. Pt notified

## 2021-08-19 NOTE — Telephone Encounter (Signed)
PT need refill on citalopram (CELEXA) 40 MG tablet  [id number] - [place], [address] AT [facility]  [facility] told the patient that this is being rejected by Dr Carlota Raspberry?    Pt call back [phone]

## 2021-08-26 ENCOUNTER — Other Ambulatory Visit: Payer: Self-pay | Admitting: Family Medicine

## 2021-08-26 DIAGNOSIS — F411 Generalized anxiety disorder: Secondary | ICD-10-CM

## 2021-08-27 NOTE — Telephone Encounter (Signed)
Last OV--03/28/2021 Last refill--05/03/21  Please advise

## 2021-08-27 NOTE — Telephone Encounter (Signed)
Discussed in April. Controlled substance database (PDMP) reviewed. No concerns appreciated. Last filled in June. Refill ordered.

## 2021-09-04 ENCOUNTER — Ambulatory Visit: Payer: BC Managed Care – PPO | Admitting: Family Medicine

## 2021-10-11 ENCOUNTER — Ambulatory Visit: Payer: BC Managed Care – PPO | Admitting: Family Medicine

## 2021-10-11 DIAGNOSIS — F411 Generalized anxiety disorder: Secondary | ICD-10-CM

## 2021-10-11 DIAGNOSIS — E559 Vitamin D deficiency, unspecified: Secondary | ICD-10-CM

## 2021-10-11 DIAGNOSIS — F331 Major depressive disorder, recurrent, moderate: Secondary | ICD-10-CM

## 2021-10-11 DIAGNOSIS — I10 Essential (primary) hypertension: Secondary | ICD-10-CM

## 2021-10-14 ENCOUNTER — Ambulatory Visit: Payer: BC Managed Care – PPO | Admitting: Family Medicine

## 2021-10-24 ENCOUNTER — Other Ambulatory Visit: Payer: Self-pay | Admitting: Family Medicine

## 2021-10-24 DIAGNOSIS — F331 Major depressive disorder, recurrent, moderate: Secondary | ICD-10-CM

## 2021-10-24 DIAGNOSIS — F411 Generalized anxiety disorder: Secondary | ICD-10-CM

## 2021-11-05 ENCOUNTER — Other Ambulatory Visit: Payer: Self-pay | Admitting: General Surgery

## 2021-11-05 DIAGNOSIS — K432 Incisional hernia without obstruction or gangrene: Secondary | ICD-10-CM

## 2021-11-18 ENCOUNTER — Other Ambulatory Visit: Payer: Self-pay | Admitting: Family Medicine

## 2021-11-18 DIAGNOSIS — F411 Generalized anxiety disorder: Secondary | ICD-10-CM

## 2021-11-18 NOTE — Telephone Encounter (Signed)
Controlled substance database reviewed, last filled in September.  Due for follow-up visit. I will refill prescription 1 additional time but please schedule office visit.  It appears she had to cancel the visit scheduled in November.  Thanks.

## 2021-11-19 NOTE — Telephone Encounter (Signed)
Unable to LM due to MB being full, I have sent a mychart message

## 2021-12-26 ENCOUNTER — Encounter: Payer: Self-pay | Admitting: Family Medicine

## 2021-12-26 ENCOUNTER — Other Ambulatory Visit: Payer: Self-pay

## 2021-12-26 ENCOUNTER — Telehealth (INDEPENDENT_AMBULATORY_CARE_PROVIDER_SITE_OTHER): Payer: BC Managed Care – PPO | Admitting: Family Medicine

## 2021-12-26 VITALS — BP 136/86 | Wt 193.0 lb

## 2021-12-26 DIAGNOSIS — F411 Generalized anxiety disorder: Secondary | ICD-10-CM

## 2021-12-26 DIAGNOSIS — E119 Type 2 diabetes mellitus without complications: Secondary | ICD-10-CM | POA: Diagnosis not present

## 2021-12-26 DIAGNOSIS — E559 Vitamin D deficiency, unspecified: Secondary | ICD-10-CM | POA: Diagnosis not present

## 2021-12-26 DIAGNOSIS — R062 Wheezing: Secondary | ICD-10-CM

## 2021-12-26 DIAGNOSIS — F331 Major depressive disorder, recurrent, moderate: Secondary | ICD-10-CM

## 2021-12-26 DIAGNOSIS — I10 Essential (primary) hypertension: Secondary | ICD-10-CM

## 2021-12-26 DIAGNOSIS — E7800 Pure hypercholesterolemia, unspecified: Secondary | ICD-10-CM

## 2021-12-26 MED ORDER — CITALOPRAM HYDROBROMIDE 40 MG PO TABS: ORAL_TABLET | ORAL | 2 refills | Status: DC

## 2021-12-26 MED ORDER — ATORVASTATIN CALCIUM 20 MG PO TABS: 20.0000 mg | ORAL_TABLET | Freq: Every day | ORAL | 3 refills | Status: DC

## 2021-12-26 MED ORDER — TRAZODONE HCL 50 MG PO TABS: ORAL_TABLET | ORAL | 2 refills | Status: DC

## 2021-12-26 MED ORDER — LISINOPRIL-HYDROCHLOROTHIAZIDE 20-25 MG PO TABS: 1.0000 | ORAL_TABLET | Freq: Every day | ORAL | 2 refills | Status: DC

## 2021-12-26 MED ORDER — ALBUTEROL SULFATE HFA 108 (90 BASE) MCG/ACT IN AERS: 1.0000 | INHALATION_SPRAY | Freq: Four times a day (QID) | RESPIRATORY_TRACT | 1 refills | Status: AC | PRN

## 2021-12-26 NOTE — Patient Instructions (Signed)
° ° ° °  If you have lab work done today you will be contacted with your lab results within the next 2 weeks.  If you have not heard from us then please contact us. The fastest way to get your results is to register for My Chart. ° ° °IF you received an x-ray today, you will receive an invoice from [facility]. Please contact [facility] at [phone] with questions or concerns regarding your invoice.  ° °IF you received labwork today, you will receive an invoice from [facility]. Please contact [facility] at [phone] with questions or concerns regarding your invoice.  ° °Our billing staff will not be able to assist you with questions regarding bills from these companies. ° °You will be contacted with the lab results as soon as they are available. The fastest way to get your results is to activate your My Chart account. Instructions are located on the last page of this paperwork. If you have not heard from us regarding the results in 2 weeks, please contact this office. °  ° ° ° °

## 2021-12-26 NOTE — Progress Notes (Signed)
Virtual Visit via Video Note  I connected with Stacy Hull on 12/26/21 at 4:48 PM by a video enabled telemedicine application and verified that I am speaking with the correct person using two identifiers.  Patient location: home - husband present - consent obtained.  My location: office [place].    I discussed the limitations, risks, security and privacy concerns of performing an evaluation and management service by telephone and the availability of in person appointments. I also discussed with the patient that there may be a patient responsible charge related to this service. The patient expressed understanding and agreed to proceed, consent obtained  Chief complaint:  Chief Complaint  Patient presents with   Medication Refill    Patient states she needs a medication refill/ recheck on atorvastatin, vitamin d and albuterol. Patient states she just had a full panel labs done recently if you wanted to review results.    History of Present Illness: Stacy Hull is a 51 y.o. female  Reactive airway/wheezing, mild intermittent by hx.  Treated with albuterol as needed previously for colds. No recent need - other one expiring. No recent changes.  Hypertension: Treated with lisinopril HCTZ 20/25 mg daily. No new side effects.  Home readings: 136/86 today. Constitutional: Negative for new fatigue and unexpected weight change.  Eyes: Negative for visual disturbance.  Respiratory: Negative for cough, chest tightness and shortness of breath.   Cardiovascular: Negative for chest pain, palpitations and leg swelling. Neurological: Negative for dizziness, light-headedness and headaches.  Had some bloodwork recently with [facility] - Dr. Michail Sermon for GI issues, on bentyl, option of budesonide.     BP Readings from Last 3 Encounters:  12/26/21 136/86  04/26/21 (!) 168/96  04/25/21 139/87   Lab Results  Component Value Date   CREATININE 0.58 04/25/2021   Diabetes: Previously  treated with metformin, GI upset in past but not sure was due to metformin. Not taking in past 9 months  Home readings:none.  Due for updated A1c, urine microalbumin. Has lost weight and changed diet.  Optho eval in past 6 months. [facility].  Lab Results  Component Value Date   HGBA1C 7.2 (H) 01/18/2021   HGBA1C 6.6 (H) 08/02/2020   HGBA1C 6.6 (H) 01/26/2020   Lab Results  Component Value Date   LDLCALC 50 01/18/2021   CREATININE 0.58 04/25/2021   Wt Readings from Last 3 Encounters:  12/26/21 193 lb (87.5 kg)  04/26/21 199 lb 6.4 oz (90.4 kg)  04/25/21 198 lb (89.8 kg)  Home weight 191.4.    Hyperlipidemia: Lipitor 20 mg daily, no new myalgias or side effects told had normal liver/kidney tests  Lab Results  Component Value Date   CHOL 150 01/18/2021   HDL 58 01/18/2021   LDLCALC 50 01/18/2021   TRIG 275 (H) 01/18/2021   CHOLHDL 2.6 01/18/2021   Lab Results  Component Value Date   ALT 20 03/28/2021   AST 18 03/28/2021   ALKPHOS 57 03/28/2021   BILITOT 0.8 03/28/2021   Vitamin D deficiency Treated with prescription 50,000 units weekly previously.no recent testing.  Last took Rx dose 4 months ago. Otc vit D 5000iu daily since.  Last vitamin D Lab Results  Component Value Date   VD25OH 16.4 (L) 01/18/2021    Depression with anxiety Generalized anxiety disorder Treated with Celex 40 mg daily, trazodone 50 mg nightly.  Xanax 0.5 mg as needed.  few days per week if needed on prior discussion, last discussed in March 2022.  Plan  for continued follow-up with therapist, handout given on stress management Controlled substance database reviewed.  Last alprazolam No. 30 filled on 11/19/2021.  Previous prescription August 27, 2021, also for #30. Taking alprazolam about once per week. Trazodone once at night and celexa working well.   Depression screen [facility] 2/9 12/26/2021 02/19/2021 01/18/2021 01/14/2021 10/19/2020  Decreased Interest 0 0 0 0 0  Down, Depressed, Hopeless 0  0 2 0 0  PHQ - 2 Score 0 0 2 0 0  Altered sleeping 0 - 0 - -  Tired, decreased energy 0 - 1 - -  Change in appetite 0 - 0 - -  Feeling bad or failure about yourself  0 - 3 - -  Trouble concentrating 0 - 1 - -  Moving slowly or fidgety/restless 0 - 0 - -  Suicidal thoughts 0 - 0 - -  PHQ-9 Score 0 - 7 - -  Difficult doing work/chores Not difficult at all - - - -  Some recent data might be hidden   HM: Had flu and covid vaccine recently.   Patient Active Problem List   Diagnosis Date Noted   Rectal bleeding 04/26/2021   Rectal pain 04/26/2021   Ileitis, terminal ([place]) 04/26/2021   Serrated polyp of colon 04/26/2021   Acute proctitis 04/26/2021   Prolapsed internal hemorrhoids, grade 3 04/26/2021   Left corneal abrasion 01/09/2020   Controlled type 2 diabetes mellitus without complication, without long-term current use of insulin ([place]) 10/13/2019   GAD (generalized anxiety disorder) 03/22/2019   Sinus congestion 11/18/2017   Acute non-recurrent maxillary sinusitis 11/18/2017   S/P hysterectomy 11/21/2016   Vitamin D deficiency 05/12/2016   Anxiety state 09/27/2007   Hyperlipidemia 03/18/2007   Depression 03/18/2007   Essential hypertension 03/18/2007   Past Medical History:  Diagnosis Date   Allergic rhinitis    Anxiety    Asthma    MILD    Depression    DM type 2 (diabetes mellitus, type 2) ([place])    DIET CONTROLLED   GERD (gastroesophageal reflux disease)    takes TUMS   HTN (hypertension)    Hyperlipidemia    Hyperparathyroidism ([place])    Pneumonia YRS AGO   Wears glasses    Past Surgical History:  Procedure Laterality Date   ABDOMINAL HYSTERECTOMY N/A 2017   CERVICAL CONE BIOPSY  YRS AGO   CESAREAN SECTION  2005   CESAREAN SECTION N/A    [employer] 01/18/2021   EVALUATION UNDER ANESTHESIA WITH HEMORRHOIDECTOMY N/A 04/26/2021   Procedure: EXAM UNDER ANESTHESIA  WITH HEMORRHOIDECTOMY; HEMORRHOIDPEXY AND LIGATION;  Surgeon: Michael Boston, MD;  Location: [facility];  Service: General;  Laterality: N/A;   HERNIA REPAIR N/A    HYSTEROSCOPY WITH NOVASURE  11/26/2012   Procedure: HYSTEROSCOPY WITH NOVASURE;  Surgeon: Marvene Staff, MD;  Location: [facility];  Service: Gynecology;  Laterality: N/A;   LAPAROSCOPIC LYSIS OF ADHESIONS N/A 11/21/2016   Procedure: EXTENSIVE LAPAROSCOPIC LYSIS OF ADHESIONS;  Surgeon: Servando Salina, MD;  Location: [facility];  Service: Gynecology;  Laterality: N/A;   LAPAROSCOPY N/A 11/21/2016   Procedure: LAPAROSCOPY DIAGNOSTIC, LAPAROSCOPIC REMOVAL OF FOREIGN BODY;  Surgeon: Ralene Ok, MD;  Location: [facility];  Service: General;  Laterality: N/A;   PARATHYROIDECTOMY  1999   benign   REPAIR VAGINAL CUFF N/A 12/14/2016   Procedure: REPAIR VAGINAL CUFF;  Surgeon: Brien Few, MD;  Location: [facility];  Service: Gynecology;  Laterality: N/A;   ROBOTIC ASSISTED LAPAROSCOPIC OVARIAN CYSTECTOMY Right  10/01/2015   Procedure: ROBOTIC ASSISTED LAPAROSCOPIC BILATERAL SALPINGECTOMY WITH LYSIS OF ADHESIONS AND RIGHT ADNEXAL CYST;  Surgeon: Princess Bruins, MD;  Location: [facility];  Service: Gynecology;  Laterality: Right;  POSSIBLE robot .  DO NOT DRAPE THE ROBOT.....she will use the laparoscopic camera to start.     ROBOTIC ASSISTED TOTAL HYSTERECTOMY WITH SALPINGECTOMY N/A 11/21/2016   Procedure: ROBOTIC ASSISTED TOTAL HYSTERECTOMY WITH  EXTENSIVE LYSIS OF ADHESIONS;  Surgeon: Servando Salina, MD;  Location: [facility];  Service: Gynecology;  Laterality: N/A;   TUBAL LIGATION  YSR AGO   FALLOPIAN TUBES REMOIVED   UMBILICAL HERNIA REPAIR     WISDOM TOOTH EXTRACTION  AGE 41   Allergies  Allergen Reactions   Erythromycin Other (See Comments)    Reaction:  GI upset    Prior to Admission medications   Medication Sig Start Date End Date Taking? Authorizing Provider  albuterol (VENTOLIN HFA) 108 (90 Base) MCG/ACT inhaler Inhale 2 puffs into the lungs every 6 (six) hours as needed for wheezing or shortness of breath.    Yes [provider]  ALPRAZolam Duanne Moron) 0.5 MG tablet TAKE 1 TABLET BY MOUTH DAILY AS NEEDED FOR ANXIETY 11/18/21  Yes Wendie Agreste, MD  atorvastatin (LIPITOR) 20 MG tablet Take 1 tablet (20 mg total) by mouth daily. 10/19/20  Yes Posey Boyer, MD  budesonide (ENTOCORT EC) 3 MG 24 hr capsule Take 9 mg by mouth daily. 3 OF 3 MG TABS IN AM   Yes [provider]  citalopram (CELEXA) 40 MG tablet Take 1 tablet (40mg ) by mouth daily 08/19/21  Yes Wendie Agreste, MD  diazepam (VALIUM) 5 MG tablet Take 1 tablet (5 mg total) by mouth every 6 (six) hours as needed for muscle spasms (difficulty urinating). 04/26/21  Yes Michael Boston, MD  hydrocortisone-pramoxine [facility]) 2.5-1 % rectal cream Place 1 application rectally 3 (three) times daily. 10/19/20  Yes Posey Boyer, MD  lidocaine (XYLOCAINE) 5 % ointment Apply 1 application topically as needed. 02/19/21  Yes Maximiano Coss, NP  lisinopril-hydrochlorothiazide (ZESTORETIC) 20-25 MG tablet Take 1 tablet by mouth daily. 01/18/21  Yes Wendie Agreste, MD  Multiple Vitamin (MULTIVITAMIN WITH MINERALS) TABS tablet Take 1 tablet by mouth daily.   Yes [provider]  oxyCODONE (OXY IR/ROXICODONE) 5 MG immediate release tablet Take 1-2 tablets (5-10 mg total) by mouth every 6 (six) hours as needed for moderate pain, severe pain or breakthrough pain. 04/26/21  Yes Michael Boston, MD  traZODone (DESYREL) 50 MG tablet TAKE 1 TO 2 TABLETS(50 TO 100 MG) BY MOUTH AT BEDTIME 10/25/21  Yes Wendie Agreste, MD  triamcinolone (NASACORT) 55 MCG/ACT AERO nasal inhaler Place 2 sprays into the nose daily. 01/26/20  Yes Jacelyn Pi, Lilia Argue, MD  Vitamin D, Ergocalciferol, (DRISDOL) 1.25 MG (50000 UNIT) CAPS capsule Take 1 capsule (50,000 Units total) by mouth every 7 (seven) days. Patient taking differently: Take 50,000 Units by mouth every 7 (seven) days. [facility] 01/28/21  Yes Wendie Agreste, MD  ibuprofen (ADVIL) 800 MG tablet  Take 1 tablet (800 mg total) by mouth every 8 (eight) hours as needed for moderate pain. 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 Walks 3x's weekly for exercise.   Social Determinants of Health   Financial Resource Strain: Not on file  Food Insecurity: Not on file  Transportation Needs: Not on file  Physical Activity: Not on file  Stress: Not on file  Social Connections: Not on file  Intimate Partner Violence: Not on file    Observations/Objective: Vitals:   12/26/21 1644  BP: 136/86  Weight: 193 lb (87.5 kg)  Nontoxic appearance on video, speaking full sentences without distress, euthymic mood, no respiratory distress.  All questions were answered with understanding of plan expressed.    Assessment and Plan: Controlled type 2 diabetes mellitus without complication, without long-term current use of insulin ([place]) - Plan: Hemoglobin A1c, Microalbumin / creatinine urine ratio  -Updated labs ordered.  Off metformin.  Has adjusted diet, weight loss as above.  Question of need, especially with her gastrointestinal issues and possible intolerance.  We will  decide once A1c reviewed.  Pure hypercholesterolemia - Plan: Comprehensive metabolic panel, Lipid panel, atorvastatin (LIPITOR) 20 MG tablet  -Tolerating Lipitor, updated labs ordered  Vitamin D deficiency - Plan: Vitamin D (25 hydroxy)  -On over-the-counter supplement as above.  Check updated levels to decide on prescription strength.  Essential hypertension - Plan: lisinopril-hydrochlorothiazide (ZESTORETIC) 20-25 MG tablet  -Stable by home readings, continue same, labs pending.  Other office labs unavailable at this time.  Anxiety state - Plan: citalopram (CELEXA) 40 MG tablet, traZODone (DESYREL) 50 MG tablet GAD (generalized anxiety disorder) - Plan: traZODone (DESYREL) 50 MG tablet Moderate episode of recurrent major depressive disorder ([place]) - Plan: citalopram (CELEXA) 40 MG tablet, traZODone (DESYREL) 50 MG tablet  -Stable with intermittent use of alprazolam.  Continue same regimen with Celexa, trazodone, as needed alprazolam.  Wheezing - Plan: albuterol (VENTOLIN HFA) 108 (90 Base) MCG/ACT inhaler Albuterol if needed, rare use.  Possible mild intermittent asthma versus reactive airway with respiratory illness.  Follow Up Instructions: 2-month in office, 2-week lab visit.   I discussed the assessment and treatment plan with the patient. The patient was provided an opportunity to ask questions and all were answered. The patient agreed with the plan and demonstrated an understanding of the instructions.   The patient was advised to call back or seek an in-person evaluation if the symptoms worsen or if the condition fails to improve as anticipated.  Wendie Agreste, MD

## 2022-02-05 ENCOUNTER — Other Ambulatory Visit: Payer: Self-pay | Admitting: Family Medicine

## 2022-02-05 DIAGNOSIS — F411 Generalized anxiety disorder: Secondary | ICD-10-CM

## 2022-02-05 NOTE — Telephone Encounter (Signed)
Patient is requesting a refill of the following medications: ?Requested Prescriptions  ? ?Pending Prescriptions Disp Refills  ? ALPRAZolam (XANAX) 0.5 MG tablet [Pharmacy Med Name: ALPRAZOLAM 0.5MG TABLETS] 30 tablet   ?  Sig: TAKE 1 TABLET BY MOUTH DAILY AS NEEDED FOR ANXIETY  ? ? ?Date of patient request: 02/05/22 ?Last office visit: 12/26/21 ?Date of last refill: 11/18/2021 ?Last refill amount: 30 ? ? ?

## 2022-02-05 NOTE — Telephone Encounter (Signed)
Intermittent dosing of medication discussed at most recent visit.  Controlled substance database reviewed without concerns.  Refill request granted. ?

## 2022-02-14 ENCOUNTER — Other Ambulatory Visit: Payer: Self-pay | Admitting: Family Medicine

## 2022-02-14 DIAGNOSIS — F331 Major depressive disorder, recurrent, moderate: Secondary | ICD-10-CM

## 2022-02-14 DIAGNOSIS — F411 Generalized anxiety disorder: Secondary | ICD-10-CM

## 2022-02-28 ENCOUNTER — Other Ambulatory Visit: Payer: Self-pay | Admitting: Family Medicine

## 2022-02-28 DIAGNOSIS — I10 Essential (primary) hypertension: Secondary | ICD-10-CM

## 2022-04-16 ENCOUNTER — Other Ambulatory Visit: Payer: Self-pay | Admitting: Family Medicine

## 2022-04-16 DIAGNOSIS — F411 Generalized anxiety disorder: Secondary | ICD-10-CM

## 2022-04-16 NOTE — Telephone Encounter (Signed)
Medication discussed in January.  Controlled substance database reviewed, last filled #30 on 02/06/2022, refill ordered. ?

## 2022-08-21 ENCOUNTER — Other Ambulatory Visit: Payer: Self-pay | Admitting: Lab

## 2022-08-21 ENCOUNTER — Other Ambulatory Visit: Payer: Self-pay | Admitting: Family Medicine

## 2022-08-21 DIAGNOSIS — F411 Generalized anxiety disorder: Secondary | ICD-10-CM

## 2022-08-21 DIAGNOSIS — F331 Major depressive disorder, recurrent, moderate: Secondary | ICD-10-CM

## 2022-10-07 ENCOUNTER — Other Ambulatory Visit: Payer: Self-pay | Admitting: Family Medicine

## 2022-10-07 DIAGNOSIS — F411 Generalized anxiety disorder: Secondary | ICD-10-CM

## 2022-10-08 NOTE — Telephone Encounter (Signed)
Xanax 0.5 mg LOV: 12/26/21 Last Refill:04/16/22 Upcoming appt: none

## 2022-10-08 NOTE — Telephone Encounter (Signed)
Anxiety discussed January 26.  Continued alprazolam as intermittent dosing.  Controlled substance database reviewed.  Alprazolam No. 30 last filled 04/17/2022, previously 02/06/2022.  I will refill additional time but please schedule visit for follow-up and discussion of medications.

## 2022-12-15 ENCOUNTER — Encounter: Payer: BC Managed Care – PPO | Admitting: Family Medicine

## 2022-12-19 ENCOUNTER — Other Ambulatory Visit: Payer: Self-pay | Admitting: Family Medicine

## 2022-12-19 DIAGNOSIS — F331 Major depressive disorder, recurrent, moderate: Secondary | ICD-10-CM

## 2022-12-19 DIAGNOSIS — F411 Generalized anxiety disorder: Secondary | ICD-10-CM

## 2023-01-02 ENCOUNTER — Other Ambulatory Visit: Payer: Self-pay | Admitting: Family Medicine

## 2023-01-02 DIAGNOSIS — E7800 Pure hypercholesterolemia, unspecified: Secondary | ICD-10-CM

## 2023-01-17 ENCOUNTER — Other Ambulatory Visit: Payer: Self-pay | Admitting: Family Medicine

## 2023-01-17 DIAGNOSIS — I10 Essential (primary) hypertension: Secondary | ICD-10-CM

## 2023-01-20 ENCOUNTER — Other Ambulatory Visit: Payer: Self-pay | Admitting: Family Medicine

## 2023-01-20 DIAGNOSIS — F411 Generalized anxiety disorder: Secondary | ICD-10-CM

## 2023-01-20 NOTE — Telephone Encounter (Signed)
Xanax 0.5 mg LOV: 12/26/21 Last Refill:10/08/22 Upcoming appt: none

## 2023-01-20 NOTE — Telephone Encounter (Signed)
Controlled substance database reviewed.  Last filled alprazolam No. 30 on 10/09/2022.  Previously Apr 17, 2022.  Due for office visit as medication last discussed last year.  Please schedule visit but I will refill 1 additional time.

## 2023-01-20 NOTE — Telephone Encounter (Signed)
Called patient to schedule appt no answer, LM for patient to call back to schedule her appt.

## 2023-01-22 ENCOUNTER — Ambulatory Visit: Payer: BC Managed Care – PPO | Admitting: Family Medicine

## 2023-01-22 ENCOUNTER — Encounter: Payer: Self-pay | Admitting: Family Medicine

## 2023-01-22 VITALS — BP 142/84 | HR 100 | Ht 67.0 in | Wt 194.0 lb

## 2023-01-22 DIAGNOSIS — F331 Major depressive disorder, recurrent, moderate: Secondary | ICD-10-CM | POA: Diagnosis not present

## 2023-01-22 DIAGNOSIS — F411 Generalized anxiety disorder: Secondary | ICD-10-CM

## 2023-01-22 DIAGNOSIS — I10 Essential (primary) hypertension: Secondary | ICD-10-CM

## 2023-01-22 DIAGNOSIS — E785 Hyperlipidemia, unspecified: Secondary | ICD-10-CM

## 2023-01-22 DIAGNOSIS — E559 Vitamin D deficiency, unspecified: Secondary | ICD-10-CM

## 2023-01-22 DIAGNOSIS — E119 Type 2 diabetes mellitus without complications: Secondary | ICD-10-CM

## 2023-01-22 MED ORDER — LISINOPRIL-HYDROCHLOROTHIAZIDE 20-25 MG PO TABS: 1.0000 | ORAL_TABLET | Freq: Every day | ORAL | 1 refills | Status: DC

## 2023-01-22 MED ORDER — VITAMIN D (ERGOCALCIFEROL) 1.25 MG (50000 UNIT) PO CAPS: 50000.0000 [IU] | ORAL_CAPSULE | ORAL | 1 refills | Status: DC

## 2023-01-22 MED ORDER — BUSPIRONE HCL 5 MG PO TABS: 5.0000 mg | ORAL_TABLET | Freq: Two times a day (BID) | ORAL | 1 refills | Status: DC

## 2023-01-22 MED ORDER — LISINOPRIL 10 MG PO TABS: 10.0000 mg | ORAL_TABLET | Freq: Every day | ORAL | 1 refills | Status: DC

## 2023-01-22 NOTE — Patient Instructions (Addendum)
Add lisinopril 89m to other meds. Recheck in 1 month.  Add buspar for now.  No other med changes for now.   Return to the clinic or go to the nearest emergency room if any of your symptoms worsen or new symptoms occur.  Fasting lab visit next week.  [facility] or xray: Walk in 8:30-4:30 during weekdays, no appointment needed [address]  [place]  [id number]

## 2023-01-22 NOTE — Progress Notes (Signed)
Subjective:  Patient ID: Stacy Hull, female    DOB: 1971/01/16  Age: 52 y.o. MRN: [id number]  CC:  Chief Complaint  Patient presents with   Diabetes    HPI Stacy Hull presents for   Hypertension: Previously treated with lisinopril HCTZ 20/25 mg daily. No missed doses.  Home readings: 140/90's. No chest pain, dyspnea.  BP Readings from Last 3 Encounters:  01/22/23 (!) 142/84  12/26/21 136/86  04/26/21 (!) 168/96   Lab Results  Component Value Date   CREATININE 0.58 04/25/2021   History of vitamin D deficiency Treated with 50,000 unit dosing previously.  Last vitamin D Lab Results  Component Value Date   VD25OH 16.4 (L) 01/18/2021  No recent supplement. Off for about a year. No recent supplement.   Depression with anxiety Treated with Celexa 40 mg daily, trazodone 50 mg nightly, Xanax as needed previously. Controlled substance database (PDMP) reviewed. No concerns appreciated. Last filled 10/09/22.   Therapist treatment in the past, and stress management handouts given previously. Going ok.  Celexa and trazodone overall doing well, but some increased anxiety, agitation. Buspirone helped in past by prior PCP. Has reached out to therapist - working on getting an appointment. Increased pressure and stress at work.   Hyperlipidemia: Lipitor 20 mg daily previously. Still taking - no new myalgias/side effects.  Lab Results  Component Value Date   CHOL 150 01/18/2021   HDL 58 01/18/2021   LDLCALC 50 01/18/2021   TRIG 275 (H) 01/18/2021   CHOLHDL 2.6 01/18/2021   Lab Results  Component Value Date   ALT 20 03/28/2021   AST 18 03/28/2021   ALKPHOS 57 03/28/2021   BILITOT 0.8 03/28/2021   Diabetes: Last visit January 2023, GI upset with metformin.  No recent labs. Has seen GI for chronic abdominal issues, IBD, ileitis. Plans to follow up with GI. Similar symptoms as in past.  No home readings.  Microalbumin: due   Lab Results  Component Value Date    HGBA1C 7.2 (H) 01/18/2021   HGBA1C 6.6 (H) 08/02/2020   HGBA1C 6.6 (H) 01/26/2020   Lab Results  Component Value Date   LDLCALC 50 01/18/2021   CREATININE 0.58 04/25/2021    History Patient Active Problem List   Diagnosis Date Noted   Rectal bleeding 04/26/2021   Rectal pain 04/26/2021   Ileitis, terminal ([place]) 04/26/2021   Serrated polyp of colon 04/26/2021   Acute proctitis 04/26/2021   Prolapsed internal hemorrhoids, grade 3 04/26/2021   Left corneal abrasion 01/09/2020   Controlled type 2 diabetes mellitus without complication, without long-term current use of insulin ([place]) 10/13/2019   GAD (generalized anxiety disorder) 03/22/2019   Sinus congestion 11/18/2017   Acute non-recurrent maxillary sinusitis 11/18/2017   S/P hysterectomy 11/21/2016   Vitamin D deficiency 05/12/2016   Anxiety state 09/27/2007   Hyperlipidemia 03/18/2007   Depression 03/18/2007   Essential hypertension 03/18/2007   Past Medical History:  Diagnosis Date   Allergic rhinitis    Anxiety    Asthma    MILD    Depression    DM type 2 (diabetes mellitus, type 2) (HCC)    DIET CONTROLLED   GERD (gastroesophageal reflux disease)    takes TUMS   HTN (hypertension)    Hyperlipidemia    Hyperparathyroidism ([place])    Pneumonia YRS AGO   Wears glasses    Past Surgical History:  Procedure Laterality Date   ABDOMINAL HYSTERECTOMY N/A 2017   CERVICAL CONE BIOPSY  YRS  AGO   CESAREAN SECTION  2005   CESAREAN SECTION N/A    [employer] 01/18/2021   EVALUATION UNDER ANESTHESIA WITH HEMORRHOIDECTOMY N/A 04/26/2021   Procedure: EXAM UNDER ANESTHESIA  WITH HEMORRHOIDECTOMY; HEMORRHOIDPEXY AND LIGATION;  Surgeon: Michael Boston, MD;  Location: [facility];  Service: General;  Laterality: N/A;   HERNIA REPAIR N/A    HYSTEROSCOPY WITH NOVASURE  11/26/2012   Procedure: HYSTEROSCOPY WITH NOVASURE;  Surgeon: Marvene Staff, MD;  Location: [facility];  Service: Gynecology;  Laterality: N/A;    LAPAROSCOPIC LYSIS OF ADHESIONS N/A 11/21/2016   Procedure: EXTENSIVE LAPAROSCOPIC LYSIS OF ADHESIONS;  Surgeon: Servando Salina, MD;  Location: [facility];  Service: Gynecology;  Laterality: N/A;   LAPAROSCOPY N/A 11/21/2016   Procedure: LAPAROSCOPY DIAGNOSTIC, LAPAROSCOPIC REMOVAL OF FOREIGN BODY;  Surgeon: Ralene Ok, MD;  Location: [facility];  Service: General;  Laterality: N/A;   PARATHYROIDECTOMY  1999   benign   REPAIR VAGINAL CUFF N/A 12/14/2016   Procedure: REPAIR VAGINAL CUFF;  Surgeon: Brien Few, MD;  Location: [facility];  Service: Gynecology;  Laterality: N/A;   ROBOTIC ASSISTED LAPAROSCOPIC OVARIAN CYSTECTOMY Right 10/01/2015   Procedure: ROBOTIC ASSISTED LAPAROSCOPIC BILATERAL SALPINGECTOMY WITH LYSIS OF ADHESIONS AND RIGHT ADNEXAL CYST;  Surgeon: Princess Bruins, MD;  Location: [facility];  Service: Gynecology;  Laterality: Right;  POSSIBLE robot .  DO NOT DRAPE THE ROBOT.....she will use the laparoscopic camera to start.     ROBOTIC ASSISTED TOTAL HYSTERECTOMY WITH SALPINGECTOMY N/A 11/21/2016   Procedure: ROBOTIC ASSISTED TOTAL HYSTERECTOMY WITH  EXTENSIVE LYSIS OF ADHESIONS;  Surgeon: Servando Salina, MD;  Location: [facility];  Service: Gynecology;  Laterality: N/A;   TUBAL LIGATION  YSR AGO   FALLOPIAN TUBES REMOIVED   UMBILICAL HERNIA REPAIR     WISDOM TOOTH EXTRACTION  AGE 30   Allergies  Allergen Reactions   Erythromycin Other (See Comments)    Reaction:  GI upset    Prior to Admission medications   Medication Sig Start Date End Date Taking? Authorizing Provider  albuterol (PROVENTIL) (2.5 MG/3ML) 0.083% nebulizer solution Take 2.5 mg by nebulization. 12/05/22  Yes [provider]  albuterol (VENTOLIN HFA) 108 (90 Base) MCG/ACT inhaler Inhale 1-2 puffs into the lungs every 6 (six) hours as needed for wheezing or shortness of breath. 12/26/21  Yes Wendie Agreste, MD  ALPRAZolam Duanne Moron) 0.5 MG tablet TAKE 1 TABLET BY MOUTH DAILY AS NEEDED FOR ANXIETY 01/20/23   Yes Wendie Agreste, MD  atorvastatin (LIPITOR) 20 MG tablet TAKE 1 TABLET(20 MG) BY MOUTH DAILY 01/02/23  Yes Wendie Agreste, MD  citalopram (CELEXA) 40 MG tablet TAKE 1 TABLET(40 MG) BY MOUTH DAILY 12/19/22  Yes Wendie Agreste, MD  dicyclomine (BENTYL) 20 MG tablet 1 capsule Orally three times a day as needed for 30 days 12/19/21  Yes [provider]  hydrocortisone-pramoxine (ANALPRAM HC) 2.5-1 % rectal cream Place 1 application rectally 3 (three) times daily. 10/19/20  Yes Posey Boyer, MD  ibuprofen (ADVIL) 800 MG tablet Take 1 tablet (800 mg total) by mouth every 8 (eight) hours as needed for moderate pain. Take with food 01/14/21  Yes Just, Laurita Quint, FNP  lisinopril-hydrochlorothiazide (ZESTORETIC) 20-25 MG tablet Take 1 tablet by mouth daily. 12/26/21  Yes Wendie Agreste, MD  Multiple Vitamin (MULTIVITAMIN WITH MINERALS) TABS tablet Take 1 tablet by mouth daily.   Yes [provider]  traZODone (DESYREL) 50 MG tablet TAKE 1 TO 2 TABLETS(50 TO 100 MG) BY  MOUTH AT BEDTIME 08/21/22  Yes Wendie Agreste, MD  triamcinolone (NASACORT) 55 MCG/ACT AERO nasal inhaler Place 2 sprays into the nose daily. 01/26/20  Yes Jacelyn Pi, Lilia Argue, MD  Vitamin D, Ergocalciferol, (DRISDOL) 1.25 MG (50000 UNIT) CAPS capsule Take 1 capsule (50,000 Units total) by mouth every 7 (seven) days. Patient taking differently: Take 50,000 Units by mouth every 7 (seven) days. [facility] 01/28/21  Yes Wendie Agreste, MD  busPIRone (BUSPAR) 10 MG tablet 1 tablet Orally Twice a day Patient not taking: Reported on 01/22/2023    [provider]   Social History   Socioeconomic History   Marital status: Married    Spouse name: Jandra Arvidson   Number of children: 2   Years of education: Master's +   Highest education level: Not on file  Occupational History   Occupation: HIGH SCHOOL ENGLISH     Employer: [place]  Tobacco Use    Smoking status: Never   Smokeless tobacco: Never  Vaping Use   Vaping Use: Never used  Substance and Sexual Activity   Alcohol use: Not Currently    Alcohol/week: 4.0 standard drinks of alcohol    Types: 4 Standard drinks or equivalent per week   Drug use: No   Sexual activity: Yes    Partners: Male    Birth control/protection: Surgical  Other Topics Concern   Not on file  Social History Narrative   Lives with her husband and their 2 children. Walks 3x's weekly for exercise.   Social Determinants of Health   Financial Resource Strain: Not on file  Food Insecurity: Not on file  Transportation Needs: Not on file  Physical Activity: Not on file  Stress: Not on file  Social Connections: Not on file  Intimate Partner Violence: Not on file    Review of Systems Per hpi.   Objective:   Vitals:   01/22/23 1606  BP: (!) 142/84  Pulse: 100  Weight: 194 lb (88 kg)  Height: '5\' 7"'$  (1.702 m)     Physical Exam Vitals reviewed.  Constitutional:      Appearance: Normal appearance. She is well-developed.  HENT:     Head: Normocephalic and atraumatic.  Eyes:     Conjunctiva/sclera: Conjunctivae normal.     Pupils: Pupils are equal, round, and reactive to light.  Neck:     Vascular: No carotid bruit.  Cardiovascular:     Rate and Rhythm: Normal rate and regular rhythm.     Heart sounds: Normal heart sounds.  Pulmonary:     Effort: Pulmonary effort is normal.     Breath sounds: Normal breath sounds.  Abdominal:     Palpations: Abdomen is soft. There is no pulsatile mass.  Musculoskeletal:     Right lower leg: No edema.     Left lower leg: No edema.  Skin:    General: Skin is warm and dry.  Neurological:     Mental Status: She is alert and oriented to person, place, and time.  Psychiatric:        Mood and Affect: Mood normal.        Behavior: Behavior normal.     Assessment & Plan:  Stacy Hull is a 52 y.o. female . Controlled type 2 diabetes mellitus  without complication, without long-term current use of insulin ([place]) - Plan: Hemoglobin A1c, Comprehensive metabolic panel, Microalbumin / creatinine urine ratio  -Check updated labs, adjust meds  accordingly.  Intolerance to metformin due to chronic GI concerns as above.  Ongoing follow-up with GI planned.  Anxiety state - Plan: busPIRone (BUSPAR) 5 MG tablet GAD (generalized anxiety disorder) - Plan: busPIRone (BUSPAR) 5 MG tablet Moderate episode of recurrent major depressive disorder ([place])  -Worse controlled anxiety.  Will add low-dose BuSpar.  Potential side effects discussed as well as risk and potential symptoms of serotonin syndrome with her other medication combinations.  Understanding expressed.  Recheck 1 month  Essential hypertension - Plan: Comprehensive metabolic panel, lisinopril (ZESTRIL) 10 MG tablet, lisinopril-hydrochlorothiazide (ZESTORETIC) 20-25 MG tablet  -Decreased control, add additional lisinopril 10 mg to her combo of 20/25 mg lisinopril HCTZ, recheck 1 month.  Check labs.  Vitamin D deficiency - Plan: Vitamin D, Ergocalciferol, (DRISDOL) 1.25 MG (50000 UNIT) CAPS capsule, Vitamin D (25 hydroxy)  -Check labs, replete with restart of 50,000 unit dosing, recheck levels in 6 weeks likely.  Hyperlipidemia, unspecified hyperlipidemia type - Plan: Lipid panel  -Check labs with medication adjustment accordingly, can discuss further at follow-up in 1 month.  Meds ordered this encounter  Medications   Vitamin D, Ergocalciferol, (DRISDOL) 1.25 MG (50000 UNIT) CAPS capsule    Sig: Take 1 capsule (50,000 Units total) by mouth every 7 (seven) days.    Dispense:  15 capsule    Refill:  1   lisinopril (ZESTRIL) 10 MG tablet    Sig: Take 1 tablet (10 mg total) by mouth daily.    Dispense:  90 tablet    Refill:  1    Total dose '30mg'$  lisinopril with combo.   lisinopril-hydrochlorothiazide (ZESTORETIC) 20-25 MG tablet    Sig: Take 1 tablet by mouth daily.    Dispense:  90 tablet     Refill:  1   busPIRone (BUSPAR) 5 MG tablet    Sig: Take 1 tablet (5 mg total) by mouth 2 (two) times daily.    Dispense:  60 tablet    Refill:  1   Patient Instructions  Add lisinopril '10mg'$  to other meds. Recheck in 1 month.  Add buspar for now.  No other med changes for now.   Return to the clinic or go to the nearest emergency room if any of your symptoms worsen or new symptoms occur.  Fasting lab visit next week.  [facility] or xray: Walk in 8:30-4:30 during weekdays, no appointment needed [address].  [place], [place] [id number]       Signed,   Merri Ray, MD [facility] 01/22/23 4:43 PM

## 2023-01-27 ENCOUNTER — Other Ambulatory Visit (INDEPENDENT_AMBULATORY_CARE_PROVIDER_SITE_OTHER): Payer: BC Managed Care – PPO

## 2023-01-27 DIAGNOSIS — E119 Type 2 diabetes mellitus without complications: Secondary | ICD-10-CM

## 2023-01-27 DIAGNOSIS — E785 Hyperlipidemia, unspecified: Secondary | ICD-10-CM

## 2023-01-27 DIAGNOSIS — I10 Essential (primary) hypertension: Secondary | ICD-10-CM

## 2023-01-27 DIAGNOSIS — E559 Vitamin D deficiency, unspecified: Secondary | ICD-10-CM

## 2023-01-27 LAB — COMPREHENSIVE METABOLIC PANEL
ALT: 22 U/L (ref 0–35)
AST: 25 U/L (ref 0–37)
Albumin: 4.3 g/dL (ref 3.5–5.2)
Alkaline Phosphatase: 51 U/L (ref 39–117)
BUN: 7 mg/dL (ref 6–23)
CO2: 23 mEq/L (ref 19–32)
Calcium: 9.9 mg/dL (ref 8.4–10.5)
Chloride: 99 mEq/L (ref 96–112)
Creatinine, Ser: 0.5 mg/dL (ref 0.40–1.20)
GFR: 110.11 mL/min (ref >60.00)
Glucose, Bld: 151 mg/dL — ABNORMAL HIGH (ref 70–99)
Potassium: 3.7 mEq/L (ref 3.5–5.1)
Sodium: 137 mEq/L (ref 135–145)
Total Bilirubin: 0.6 mg/dL (ref 0.2–1.2)
Total Protein: 7.5 g/dL (ref 6.0–8.3)

## 2023-01-27 LAB — HM MAMMOGRAPHY

## 2023-01-27 LAB — MICROALBUMIN / CREATININE URINE RATIO
Creatinine,U: 67.1 mg/dL
Microalb Creat Ratio: 1 mg/g (ref 0.0–30.0)
Microalb, Ur: <0.7 mg/dL (ref 0.0–1.9)

## 2023-01-27 LAB — LIPID PANEL
Cholesterol: 159 mg/dL (ref 0–200)
HDL: 56.8 mg/dL (ref >39.00)
NonHDL: 102.14
Total CHOL/HDL Ratio: 3
Triglycerides: 221 mg/dL — ABNORMAL HIGH (ref 0.0–149.0)
VLDL: 44.2 mg/dL — ABNORMAL HIGH (ref 0.0–40.0)

## 2023-01-27 LAB — HEMOGLOBIN A1C: Hgb A1c MFr Bld: 8.1 % — ABNORMAL HIGH (ref 4.6–6.5)

## 2023-01-27 LAB — VITAMIN D 25 HYDROXY (VIT D DEFICIENCY, FRACTURES): VITD: 21.38 ng/mL — ABNORMAL LOW (ref 30.00–100.00)

## 2023-01-27 LAB — LDL CHOLESTEROL, DIRECT: Direct LDL: 82 mg/dL

## 2023-03-18 ENCOUNTER — Other Ambulatory Visit: Payer: Self-pay | Admitting: Family Medicine

## 2023-03-18 DIAGNOSIS — F411 Generalized anxiety disorder: Secondary | ICD-10-CM

## 2023-04-10 ENCOUNTER — Ambulatory Visit: Payer: BC Managed Care – PPO | Admitting: Psychology

## 2023-04-22 ENCOUNTER — Other Ambulatory Visit: Payer: Self-pay | Admitting: Family Medicine

## 2023-04-22 DIAGNOSIS — F411 Generalized anxiety disorder: Secondary | ICD-10-CM

## 2023-04-22 NOTE — Telephone Encounter (Signed)
Patient is requesting a refill of the following medications: Requested Prescriptions   Pending Prescriptions Disp Refills   ALPRAZolam (XANAX) 0.5 MG tablet [Pharmacy Med Name: ALPRAZOLAM 0.5MG  TABLETS] 30 tablet     Sig: TAKE 1 TABLET BY MOUTH DAILY AS NEEDED FOR ANXIETY    Date of patient request: 04/22/23 Last office visit: 01/22/23 Date of last refill: 01/20/23 Last refill amount: 30 Follow up time period per chart: 1 month

## 2023-04-22 NOTE — Telephone Encounter (Signed)
Depression/anxiety discussed at 01/22/2023 visit.  Medications discussed at that time including additional med with BuSpar.  Controlled substance database reviewed.  Alprazolam No. 30 last filled on 01/20/2023, previously 10/09/2022, 04/17/2022.  Infrequent use, refilled.

## 2023-04-23 ENCOUNTER — Ambulatory Visit: Payer: BC Managed Care – PPO | Admitting: Psychology

## 2023-04-23 DIAGNOSIS — F4323 Adjustment disorder with mixed anxiety and depressed mood: Secondary | ICD-10-CM | POA: Diagnosis not present

## 2023-04-23 NOTE — Progress Notes (Signed)
[facility] Counselor/Therapist Progress Note  Patient ID: Stacy Hull, MRN: [id number],    Date: 04/23/2023  Time Spent: 60 mins  Treatment Type: Individual Therapy  Reported Symptoms: Pt presents for session via Caregility video, granting consent for the session.  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  I shared with pt that I am in my office with no one else here either.  Many notes for this pt are still in Therapy Charts.  Mental Status Exam: Appearance:  Casual     Behavior: Appropriate  Motor: Normal  Speech/Language:  Clear and Coherent  Affect: Appropriate  Mood: normal  Thought process: normal  Thought content:   WNL  Sensory/Perceptual disturbances:   WNL  Orientation: oriented to person, place, and time/date  Attention: Good  Concentration: Good  Memory: WNL  Fund of knowledge:  Good  Insight:   Good  Judgment:  Good  Impulse Control: Good   Risk Assessment: Danger to Self:  No Self-injurious Behavior: No Danger to Others: No Duty to Warn:no Physical Aggression / Violence:No  Access to Firearms a concern: No  Gang Involvement:No   Subjective: Pt shares "I have been pretty good.  If I had talked with you a month ago, I would have been falling apart.  I was in the middle of re-certifying my national certification and that was hard."  Pt shares that a friend gave her a book that really hit home for pt about recovering from narcissistic moms and that was really helpful for pt.  She has been trying to focus on self care recently and she has really good plans.  They have a 50 yo Advertising account planner and they got another golden retriever puppy this past Christmas.  Pt shares that Alan Mulder graduated last year and just finished his first year at [employer].  Her daughter (Caroline--wants to be called 'Stark Klein') had come out as trans and was going by "Reuel Boom".  During this session, pt refers to her as her "daughter".  She had trouble at school and  was suspended for most of this year.  She is seeing her own therapist and has decided to go back to using female pronouns and identifies as female.  Pt shares that she is back in Middle School and seems to be doing better.  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  [name] (husband) is "doing pretty good.  He is still working for [employer] in the ED and that is a hard job."  They both had difficulty dealing with their feelings about Caroline's transition and are both much more comfortable with her as a female.  Pt shares her father is doing OK and is dealing with dementia; pt's brother and his family live with him and that is hard for him.  She is hoping to visit him for Father's Day.  Pt shares that she enjoys going to church when she can go; she uses his rosary during school if needed.  She also shares that she is micro-dosing mushrooms and it helping her calm down at the end of her day.  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    Interventions: Cognitive Behavioral Therapy  Diagnosis:Adjustment disorder with mixed anxiety and depressed mood  Plan: Treatment Plan Strengths/Abilities:  Intelligent, Intuitive, Willing to participate in therapy Treatment Preferences:  Outpatient Individual Therapy Statement of  Needs:  Patient is to use CBT, mindfulness and coping skills to help manage and/or decrease symptoms associated with their diagnosis. Symptoms:  Depressed/Irritable mood, worry, social withdrawal Problems Addressed:  Depressive thoughts, Sadness, Sleep issues, etc. Long Term Goals:  Pt to reduce overall level, frequency, and intensity of the feelings of depression/anxiety as evidenced by decreased irritability, negative self talk, and helpless feelings from 6 to 7 days/week to 0 to 1 days/week, per client report, for at least 3 consecutive months.  Progress: 30% Short Term Goals:  Pt to verbally  express understanding of the relationship between feelings of depression/anxiety and their impact on thinking patterns and behaviors.  Pt to verbalize an understanding of the role that distorted thinking plays in creating fears, excessive worry, and ruminations.  Progress: 30% Target Date:  04/22/2024 Frequency:  Bi-weekly Modality:  Cognitive Behavioral Therapy Interventions by Therapist:  Therapist will use CBT, Mindfulness exercises, Coping skills and Referrals, as needed by client. Client has verbally approved this treatment plan.  Karie Kirks, [facility]

## 2023-05-07 ENCOUNTER — Ambulatory Visit: Payer: BC Managed Care – PPO | Admitting: Psychology

## 2023-05-12 ENCOUNTER — Other Ambulatory Visit: Payer: Self-pay | Admitting: Family Medicine

## 2023-05-12 DIAGNOSIS — F411 Generalized anxiety disorder: Secondary | ICD-10-CM

## 2023-05-15 ENCOUNTER — Ambulatory Visit: Payer: BC Managed Care – PPO | Admitting: Psychology

## 2023-07-01 ENCOUNTER — Encounter (INDEPENDENT_AMBULATORY_CARE_PROVIDER_SITE_OTHER): Payer: Self-pay

## 2023-07-16 ENCOUNTER — Encounter: Payer: BC Managed Care – PPO | Admitting: Family Medicine

## 2023-07-20 ENCOUNTER — Telehealth: Payer: BC Managed Care – PPO | Admitting: Family Medicine

## 2023-08-10 ENCOUNTER — Other Ambulatory Visit: Payer: Self-pay | Admitting: Family Medicine

## 2023-08-10 DIAGNOSIS — I10 Essential (primary) hypertension: Secondary | ICD-10-CM

## 2023-09-02 ENCOUNTER — Other Ambulatory Visit: Payer: Self-pay | Admitting: Family Medicine

## 2023-09-02 DIAGNOSIS — F411 Generalized anxiety disorder: Secondary | ICD-10-CM

## 2023-09-02 DIAGNOSIS — F331 Major depressive disorder, recurrent, moderate: Secondary | ICD-10-CM

## 2023-09-02 NOTE — Telephone Encounter (Signed)
last discussed in February.  Noted meeting with therapist on May 23.  Due for follow-up.  Please schedule follow-up appointment, I will refill medicine temporarily.

## 2023-09-02 NOTE — Telephone Encounter (Signed)
Patient is requesting a refill of the following medications: Requested Prescriptions   Pending Prescriptions Disp Refills   traZODone (DESYREL) 50 MG tablet [Pharmacy Med Name: TRAZODONE 50MG  TABLETS] 90 tablet 2    Sig: TAKE 1 TO 2 TABLETS(50 TO 100 MG) BY MOUTH AT BEDTIME    Date of patient request: 09/02/23 Last office visit: 01/22/23 Date of last refill: 08/21/22 Last refill amount: 90 Follow up time period per chart: 6 months

## 2023-09-03 NOTE — Telephone Encounter (Signed)
Pt has appt scheduled for 1/14

## 2023-09-15 ENCOUNTER — Other Ambulatory Visit: Payer: Self-pay | Admitting: Family Medicine

## 2023-09-15 DIAGNOSIS — F411 Generalized anxiety disorder: Secondary | ICD-10-CM

## 2023-10-05 ENCOUNTER — Ambulatory Visit: Payer: BC Managed Care – PPO | Admitting: Family Medicine

## 2023-10-05 VITALS — BP 130/82 | HR 87 | Temp 98.4°F | Ht 67.0 in | Wt 195.0 lb

## 2023-10-05 DIAGNOSIS — E1165 Type 2 diabetes mellitus with hyperglycemia: Secondary | ICD-10-CM | POA: Diagnosis not present

## 2023-10-05 DIAGNOSIS — Z7985 Long-term (current) use of injectable non-insulin antidiabetic drugs: Secondary | ICD-10-CM

## 2023-10-05 DIAGNOSIS — Z23 Encounter for immunization: Secondary | ICD-10-CM

## 2023-10-05 DIAGNOSIS — E559 Vitamin D deficiency, unspecified: Secondary | ICD-10-CM | POA: Diagnosis not present

## 2023-10-05 DIAGNOSIS — F411 Generalized anxiety disorder: Secondary | ICD-10-CM | POA: Diagnosis not present

## 2023-10-05 DIAGNOSIS — F331 Major depressive disorder, recurrent, moderate: Secondary | ICD-10-CM

## 2023-10-05 DIAGNOSIS — E785 Hyperlipidemia, unspecified: Secondary | ICD-10-CM

## 2023-10-05 LAB — COMPREHENSIVE METABOLIC PANEL
ALT: 19 U/L (ref 0–35)
AST: 20 U/L (ref 0–37)
Albumin: 4.5 g/dL (ref 3.5–5.2)
Alkaline Phosphatase: 52 U/L (ref 39–117)
BUN: 9 mg/dL (ref 6–23)
CO2: 29 meq/L (ref 19–32)
Calcium: 10.1 mg/dL (ref 8.4–10.5)
Chloride: 96 meq/L (ref 96–112)
Creatinine, Ser: 0.54 mg/dL (ref 0.40–1.20)
GFR: 107.56 mL/min (ref >60.00)
Glucose, Bld: 118 mg/dL — ABNORMAL HIGH (ref 70–99)
Potassium: 3.6 meq/L (ref 3.5–5.1)
Sodium: 136 meq/L (ref 135–145)
Total Bilirubin: 0.5 mg/dL (ref 0.2–1.2)
Total Protein: 7.8 g/dL (ref 6.0–8.3)

## 2023-10-05 LAB — HEMOGLOBIN A1C: Hgb A1c MFr Bld: 8.6 % — ABNORMAL HIGH (ref 4.6–6.5)

## 2023-10-05 LAB — VITAMIN D 25 HYDROXY (VIT D DEFICIENCY, FRACTURES): VITD: 28.89 ng/mL — ABNORMAL LOW (ref 30.00–100.00)

## 2023-10-05 MED ORDER — BUSPIRONE HCL 7.5 MG PO TABS: 7.5000 mg | ORAL_TABLET | Freq: Two times a day (BID) | ORAL | 2 refills | Status: DC

## 2023-10-05 MED ORDER — TIRZEPATIDE 2.5 MG/0.5ML ~~LOC~~ SOAJ: 2.5000 mg | SUBCUTANEOUS | 2 refills | Status: DC

## 2023-10-05 NOTE — Patient Instructions (Addendum)
Keep follow up with therapist as planned.  Increase buspar to 7.5mg  twice per day.  Recheck anxiety in 1 month - virtual visit is ok.  Start Mounjaro, low-dose injection once per week for now.  We can discuss increases at your follow-up visit.  If any concerns on labs I will let you know, otherwise can discuss at follow-up in 1 month.  Take care! Return to the clinic or go to the nearest emergency room if any of your symptoms worsen or new symptoms occur.

## 2023-10-05 NOTE — Progress Notes (Signed)
Subjective:  Patient ID: Stacy Hull, female    DOB: 08-22-1971  Age: 52 y.o. MRN: [id number]  CC:  Chief Complaint  Patient presents with   Medical Management of Chronic Issues    Pt wanted to discuss A1c and about starting metformin again    Anxiety    Pt notes she has been experiencing some heightened anxiety and feels agitated,    Bloated    Pt would like abdomen checked due to multiple hernias and repairs     HPI Stacy Hull presents for concerns above.  Last visit with me in February. Recommended 1 month follow-up at that time.   Hypertension: Discussed in January, on lisinopril HCTZ 20/25 mg daily at that time.  Elevated readings at home.Added additional 10 mg of lisinopril.  Home readings: 130/80 range.  No new side effects with additional meds.   BP Readings from Last 3 Encounters:  10/05/23 130/82  01/22/23 (!) 142/84  12/26/21 136/86   Lab Results  Component Value Date   CREATININE 0.50 01/27/2023    Anxiety Decreased control in February.  Celexa 40 mg daily, trazodone 50 mg nightly at that time, added BuSpar 5 mg twice daily.  Plan on follow-up with therapist.  Was experiencing some increased pressure and stress at work. Still feeling some increased anxiety, agitation. Met with therapist in May, but appt in 11/13. Some increased stress. Possibly perimenopausal sx's. Anxiety has increased.  No SE's with buspirone BID.  Trazodone working well for sleep.   Diabetes: Uncontrolled in February.  GI upset previously with metformin.  She has had some chronic abdominal issues including IBD, ileitis and plan for gastroenterology follow-up when discussed in February. Some similar GI issues. Has not contacted GI about these issues - similar as in the past.  No follow up to review meds.  No FH of MEN syndrome or thyroid CA, no hx of pancreatitis.   Microalbumin: normal ratio 01/27/23. Took vitamin D supplement earlier in the year, then off past 2 months. Otc  MVI only now.  Lab Results  Component Value Date   HGBA1C 8.1 (H) 01/27/2023   HGBA1C 7.2 (H) 01/18/2021   HGBA1C 6.6 (H) 08/02/2020   Lab Results  Component Value Date   MICROALBUR <0.7 01/27/2023   LDLCALC 50 01/18/2021   CREATININE 0.50 01/27/2023      History Patient Active Problem List   Diagnosis Date Noted   Rectal bleeding 04/26/2021   Rectal pain 04/26/2021   Ileitis, terminal (HCC) 04/26/2021   Serrated polyp of colon 04/26/2021   Acute proctitis 04/26/2021   Prolapsed internal hemorrhoids, grade 3 04/26/2021   Left corneal abrasion 01/09/2020   Controlled type 2 diabetes mellitus without complication, without long-term current use of insulin (HCC) 10/13/2019   GAD (generalized anxiety disorder) 03/22/2019   Sinus congestion 11/18/2017   Acute non-recurrent maxillary sinusitis 11/18/2017   S/P hysterectomy 11/21/2016   Vitamin D deficiency 05/12/2016   Anxiety state 09/27/2007   Hyperlipidemia 03/18/2007   Depression 03/18/2007   Essential hypertension 03/18/2007   Past Medical History:  Diagnosis Date   Allergic rhinitis    Anxiety    Asthma    MILD    Depression    DM type 2 (diabetes mellitus, type 2) (HCC)    DIET CONTROLLED   GERD (gastroesophageal reflux disease)    takes TUMS   HTN (hypertension)    Hyperlipidemia    Hyperparathyroidism (HCC)    Pneumonia YRS AGO   Wears glasses  Past Surgical History:  Procedure Laterality Date   ABDOMINAL HYSTERECTOMY N/A 2017   CERVICAL CONE BIOPSY  YRS AGO   CESAREAN SECTION  2005   CESAREAN SECTION N/A    [employer]rees[employer] 01/18/2021   EVALUATION UNDER ANESTHESIA WITH HEMORRHOIDECTOMY N/A 04/26/2021   Procedure: EXAM UNDER ANESTHESIA  WITH HEMORRHOIDECTOMY; HEMORRHOIDPEXY AND LIGATION;  Surgeon: Karie Soda, MD;  Location: [facility];  Service: General;  Laterality: N/A;   HERNIA REPAIR N/A    HYSTEROSCOPY WITH NOVASURE  11/26/2012   Procedure: HYSTEROSCOPY WITH NOVASURE;  Surgeon:  Serita Kyle, MD;  Location: [facility];  Service: Gynecology;  Laterality: N/A;   LAPAROSCOPIC LYSIS OF ADHESIONS N/A 11/21/2016   Procedure: EXTENSIVE LAPAROSCOPIC LYSIS OF ADHESIONS;  Surgeon: Maxie Better, MD;  Location: [facility];  Service: Gynecology;  Laterality: N/A;   LAPAROSCOPY N/A 11/21/2016   Procedure: LAPAROSCOPY DIAGNOSTIC, LAPAROSCOPIC REMOVAL OF FOREIGN BODY;  Surgeon: Axel Filler, MD;  Location: [facility];  Service: General;  Laterality: N/A;   PARATHYROIDECTOMY  1999   benign   REPAIR VAGINAL CUFF N/A 12/14/2016   Procedure: REPAIR VAGINAL CUFF;  Surgeon: Olivia Mackie, MD;  Location: [facility];  Service: Gynecology;  Laterality: N/A;   ROBOTIC ASSISTED LAPAROSCOPIC OVARIAN CYSTECTOMY Right 10/01/2015   Procedure: ROBOTIC ASSISTED LAPAROSCOPIC BILATERAL SALPINGECTOMY WITH LYSIS OF ADHESIONS AND RIGHT ADNEXAL CYST;  Surgeon: Genia Del, MD;  Location: [facility];  Service: Gynecology;  Laterality: Right;  POSSIBLE robot .  DO NOT DRAPE THE ROBOT.....she will use the laparoscopic camera to start.     ROBOTIC ASSISTED TOTAL HYSTERECTOMY WITH SALPINGECTOMY N/A 11/21/2016   Procedure: ROBOTIC ASSISTED TOTAL HYSTERECTOMY WITH  EXTENSIVE LYSIS OF ADHESIONS;  Surgeon: Maxie Better, MD;  Location: [facility];  Service: Gynecology;  Laterality: N/A;   TUBAL LIGATION  YSR AGO   FALLOPIAN TUBES REMOIVED   UMBILICAL HERNIA REPAIR     WISDOM TOOTH EXTRACTION  AGE 19   Allergies  Allergen Reactions   Erythromycin Other (See Comments)    Reaction:  GI upset    Prior to Admission medications   Medication Sig Start Date End Date Taking? Authorizing Provider  albuterol (PROVENTIL) (2.5 MG/3ML) 0.083% nebulizer solution Take 2.5 mg by nebulization. 12/05/22  Yes [provider]  albuterol (VENTOLIN HFA) 108 (90 Base) MCG/ACT inhaler Inhale 1-2 puffs into the lungs every 6 (six) hours as needed for wheezing or shortness of breath. 12/26/21  Yes Shade Flood, MD   ALPRAZolam Prudy Feeler) 0.5 MG tablet TAKE 1 TABLET BY MOUTH DAILY AS NEEDED FOR ANXIETY 04/22/23  Yes Shade Flood, MD  atorvastatin (LIPITOR) 20 MG tablet TAKE 1 TABLET(20 MG) BY MOUTH DAILY 01/02/23  Yes Shade Flood, MD  busPIRone (BUSPAR) 5 MG tablet TAKE 1 TABLET(5 MG) BY MOUTH TWICE DAILY 09/15/23  Yes Shade Flood, MD  citalopram (CELEXA) 40 MG tablet TAKE 1 TABLET(40 MG) BY MOUTH DAILY 12/19/22  Yes Shade Flood, MD  hydrocortisone-pramoxine (ANALPRAM HC) 2.5-1 % rectal cream Place 1 application rectally 3 (three) times daily. 10/19/20  Yes Peyton Najjar, MD  lisinopril (ZESTRIL) 10 MG tablet TAKE 1 TABLET(10 MG) BY MOUTH DAILY 08/10/23  Yes Shade Flood, MD  lisinopril-hydrochlorothiazide (ZESTORETIC) 20-25 MG tablet TAKE 1 TABLET BY MOUTH DAILY 08/10/23  Yes Shade Flood, MD  Multiple Vitamin (MULTIVITAMIN WITH MINERALS) TABS tablet Take 1 tablet by mouth daily.   Yes [provider]  traZODone (DESYREL) 50 MG tablet TAKE 1 TO 2 TABLETS(50  TO 100 MG) BY MOUTH AT BEDTIME 09/02/23  Yes Shade Flood, MD  triamcinolone (NASACORT) 55 MCG/ACT AERO nasal inhaler Place 2 sprays into the nose daily. 01/26/20  Yes Lezlie Lye, Meda Coffee, MD  dicyclomine (BENTYL) 20 MG tablet 1 capsule Orally three times a day as needed for 30 days Patient not taking: Reported on 10/05/2023 12/19/21   [provider]  ibuprofen (ADVIL) 800 MG tablet Take 1 tablet (800 mg total) by mouth every 8 (eight) hours as needed for moderate pain. Take with food Patient not taking: Reported on 10/05/2023 01/14/21   Just, Azalee Course, FNP  Vitamin D, Ergocalciferol, (DRISDOL) 1.25 MG (50000 UNIT) CAPS capsule Take 1 capsule (50,000 Units total) by mouth every 7 (seven) days. Patient not taking: Reported on 10/05/2023 01/22/23   Shade Flood, MD   Social History   Socioeconomic History   Marital status: Married    Spouse name: Duaa Stelzner   Number of children: 2   Years of education:  Master's +   Highest education level: Not on file  Occupational History   Occupation: HIGH SCHOOL ENGLISH     Employer: [employer]    Comment: [employer]  Tobacco Use   Smoking status: Never   Smokeless tobacco: Never  Vaping Use   Vaping status: Never Used  Substance and Sexual Activity   Alcohol use: Not Currently    Alcohol/week: 4.0 standard drinks of alcohol    Types: 4 Standard drinks or equivalent per week   Drug use: No   Sexual activity: Yes    Partners: Male    Birth control/protection: Surgical  Other Topics Concern   Not on file  Social History Narrative   Lives with her husband and their 2 children. Walks 3x's weekly for exercise.   Social Determinants of Health   Financial Resource Strain: Not on file  Food Insecurity: Not on file  Transportation Needs: Not on file  Physical Activity: Not on file  Stress: Not on file  Social Connections: Not on file  Intimate Partner Violence: Not on file    Review of Systems   Objective:   Vitals:   10/05/23 1107  BP: 130/82  Pulse: 87  Temp: 98.4 F (36.9 C)  TempSrc: Temporal  SpO2: 98%  Weight: 195 lb (88.5 kg)  Height: 5\' 7"  (1.702 m)     Physical Exam     Assessment & Plan:  FRITZIE PRIOLEAU is a 52 y.o. female .  Uncontrolled type 2 diabetes mellitus with hyperglycemia (HCC) - Plan: Comprehensive metabolic panel, Hemoglobin A1c, busPIRone (BUSPAR) 7.5 MG tablet, tirzepatide (MOUNJARO) 2.5 MG/0.5ML Pen  -Uncontrolled.  Check labs, start Mounjaro with potential side effects and risk discussed, 1 month follow-up.  Chronic gastrointestinal issues, plan for GI follow-up with RTC precautions if any acute changes or new symptoms.  Vitamin D deficiency - Plan: Vitamin D (25 hydroxy)  -Check vitamin D level, adjust plan accordingly.  Need for influenza vaccination - Plan: CANCELED: Flu vaccine trivalent PF, 6mos and older(Flulaval,Afluria,Fluarix,Fluzone)  GAD  (generalized anxiety disorder) - Plan: busPIRone (BUSPAR) 7.5 MG tablet  -Decreased control, will try higher dose of BuSpar at 7.5 mg twice daily.  Moderate episode of recurrent major depressive disorder (HCC)  -As above, trial of higher dose BuSpar, continue Celexa and trazodone same doses.  Hyperlipidemia, unspecified hyperlipidemia type  -Tolerating Lipitor, continue same.  Need for shingles vaccine - Plan: Varicella-zoster vaccine IM   Meds ordered this encounter  Medications   busPIRone (BUSPAR) 7.5 MG tablet    Sig: Take 1 tablet (7.5 mg total) by mouth 2 (two) times daily.    Dispense:  60 tablet    Refill:  2   tirzepatide (MOUNJARO) 2.5 MG/0.5ML Pen    Sig: Inject 2.5 mg into the skin once a week.    Dispense:  2 mL    Refill:  2   Patient Instructions  Keep follow up with therapist as planned.  Increase buspar to 7.5mg  twice per day.  Recheck anxiety in 1 month - virtual visit is ok.  Start Mounjaro, low-dose injection once per week for now.  We can discuss increases at your follow-up visit.  If any concerns on labs I will let you know, otherwise can discuss at follow-up in 1 month.  Take care! Return to the clinic or go to the nearest emergency room if any of your symptoms worsen or new symptoms occur.     Signed,   Meredith Staggers, MD [facility], [facility] 10/05/23 11:39 AM

## 2023-10-06 ENCOUNTER — Telehealth: Payer: Self-pay | Admitting: Pharmacist

## 2023-10-06 NOTE — Telephone Encounter (Signed)
Pharmacy Patient Advocate Encounter   Received notification from Physician's Office that prior authorization for Mounjaro 2.5MG /0.5ML auto-injectors is required/requested.   Insurance verification completed.   The patient is insured through [facility] .   Per test claim: PA required; PA submitted to above mentioned insurance via CoverMyMeds Key/confirmation #/EOC [id number] Status is pending

## 2023-10-07 ENCOUNTER — Other Ambulatory Visit (HOSPITAL_COMMUNITY): Payer: Self-pay

## 2023-10-07 NOTE — Telephone Encounter (Signed)
Pharmacy Patient Advocate Encounter  Received notification from [facility] that Prior Authorization for [facility] 2.5MG /0.5ML auto-injectors has been APPROVED from 10/06/2023 to 10/05/2026. Ran test claim, Copay is $47.00. This test claim was processed through [facility]- copay amounts may vary at other pharmacies due to pharmacy/plan contracts, or as the patient moves through the different stages of their insurance plan.   PA #/Case ID/Reference #: [id number]

## 2023-10-07 NOTE — Telephone Encounter (Signed)
Called left vm for pt for pt to call office.

## 2023-10-08 ENCOUNTER — Encounter: Payer: Self-pay | Admitting: Family Medicine

## 2023-10-08 NOTE — Telephone Encounter (Signed)
Left vm to call office about PA

## 2023-10-09 ENCOUNTER — Telehealth: Payer: Self-pay

## 2023-10-09 NOTE — Telephone Encounter (Signed)
Called patient to find out if she did in fact receive the Shingles vaccine on Monday as there is a question of if that had been administered or if it had been forgotten.   Unfortunately all  vaccine verification sheets have already been sent to scan for this week so I am unable to look for that at this time.

## 2023-10-12 NOTE — Telephone Encounter (Signed)
LM to call back.

## 2023-10-13 ENCOUNTER — Encounter: Payer: Self-pay | Admitting: Family Medicine

## 2023-10-13 NOTE — Telephone Encounter (Signed)
Care team updated and letter sent for eye exam notes.

## 2023-10-13 NOTE — Telephone Encounter (Signed)
LM to call back.

## 2023-10-14 ENCOUNTER — Ambulatory Visit: Payer: BC Managed Care – PPO | Admitting: Psychology

## 2023-10-14 DIAGNOSIS — F4323 Adjustment disorder with mixed anxiety and depressed mood: Secondary | ICD-10-CM | POA: Diagnosis not present

## 2023-10-14 NOTE — Progress Notes (Signed)
[facility] Counselor/Therapist Progress Note  Patient ID: Stacy Hull, MRN: [id number],    Date: 10/14/2023  Time Spent: 60 mins  start time: 1300     end time: 1400  Treatment Type: Individual Therapy  Reported Symptoms: Pt presents for session via Caregility video, granting consent for the session.  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  I shared with pt that I am in my office with no one else here either.  Many notes for this pt are still in Therapy Charts.  Mental Status Exam: Appearance:  Casual     Behavior: Appropriate  Motor: Normal  Speech/Language:  Clear and Coherent  Affect: Appropriate  Mood: normal  Thought process: normal  Thought content:   WNL  Sensory/Perceptual disturbances:   WNL  Orientation: oriented to person, place, and time/date  Attention: Good  Concentration: Good  Memory: WNL  Fund of knowledge:  Good  Insight:   Good  Judgment:  Good  Impulse Control: Good   Risk Assessment: Danger to Self:  No Self-injurious Behavior: No Danger to Others: No Duty to Warn:no Physical Aggression / Violence:No  Access to Firearms a concern: No  Gang Involvement:No   Subjective: Pt shares "I have been OK.  My summer was not great because I did not work and I had way too much time on my hands; I need to have a schedule so I cannot do that this coming summer.  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  Pt shares she saw her PCP last week and talked with them about her mood swings and wonders if she is going through menopause.  They adjusted one of her anxiety medications and she is hopeful that will help her more in the afternoons.  Pt shares that her daughter (goes by Stark Klein) did pretty well in her first quarter of HS this year.  Pt is glad that  she is doing OK.  Pt shares she has not been doing as much self care as she needs to be doing.  Pt shares that she and [name] are still worried about Jennette Leask's mom (24 yo); she lives alone in [place] and has still been talking about moving here.  [name] went to [place] to help her get ready to leave.  He was there for a month and did not do anything to help her.  Pt had found her an apt here but then had to cancel it when she did not end up moving.  Pt is upset with [name] for not taking his role in caring for his mom; she is also frustrated that [name] is only working 32 hours per week at [employer] and only makes $35,000.00 per year.  She feels responsible for the whole family, including [name].  Pt shares that Alan Mulder is still at [employer] [facility] and is enjoying playing the upright base and base guitar; he is in a band as well and is enjoying that.  He will be 52 yo in January and is planning to go to [employer] in the Fall of 2025.  Pt shares that her dad is doing OK; her brother and his family are still living with him.  Pt shares that her A1C kept going up and she is now taking [employer]  and is "cautiously optimistic about that and how it might work for me."  Pt has been talking with her friend, Milton Ferguson, who lives in [place] and that is good for her.  Pt is thankful that she does not have any stress at work; she has 3 honors classes per day and the kids are good.  Pt shares she has not been going to church as frequently as she would like.  Encouraged pt to consider getting back to church as much as she can; also encouraged pt to get back into reading as a self care activity.  Encouraged pt to continue with her self care activities and we will meet in 3 wks for a follow up session.    Interventions: Cognitive Behavioral Therapy  Diagnosis:Adjustment disorder with mixed anxiety and depressed mood  Plan: Treatment Plan Strengths/Abilities:  Intelligent, Intuitive, Willing to participate in therapy Treatment Preferences:  Outpatient Individual  Therapy Statement of Needs:  Patient is to use CBT, mindfulness and coping skills to help manage and/or decrease symptoms associated with their diagnosis. Symptoms:  Depressed/Irritable mood, worry, social withdrawal Problems Addressed:  Depressive thoughts, Sadness, Sleep issues, etc. Long Term Goals:  Pt to reduce overall level, frequency, and intensity of the feelings of depression/anxiety as evidenced by decreased irritability, negative self talk, and helpless feelings from 6 to 7 days/week to 0 to 1 days/week, per client report, for at least 3 consecutive months.  Progress: 30% Short Term Goals:  Pt to verbally express understanding of the relationship between feelings of depression/anxiety and their impact on thinking patterns and behaviors.  Pt to verbalize an understanding of the role that distorted thinking plays in creating fears, excessive worry, and ruminations.  Progress: 30% Target Date:  04/22/2024 Frequency:  Bi-weekly Modality:  Cognitive Behavioral Therapy Interventions by Therapist:  Therapist will use CBT, Mindfulness exercises, Coping skills and Referrals, as needed by client. Client has verbally approved this treatment plan.  Karie Kirks, [facility]

## 2023-10-14 NOTE — Telephone Encounter (Signed)
Called patient and was able to get through to her work, we discussed her shingles vaccine she was meant to get on Monday and did confirm this was not preformed, per patient "Dr Neva Seat had let me go at the end of my visit because it was a little chaotic there and I got my labs done but realized on my way home I did not get my shingles shot" I apologized for not getting that administered during her visit and patient was understanding  I have scheduled the patient for a nurse visit for later this month and she will be getting her Flu shot at that time as well. Orders have been changed to future and visit from last week can be closed

## 2023-10-28 ENCOUNTER — Ambulatory Visit (INDEPENDENT_AMBULATORY_CARE_PROVIDER_SITE_OTHER): Payer: BC Managed Care – PPO

## 2023-10-28 DIAGNOSIS — Z23 Encounter for immunization: Secondary | ICD-10-CM | POA: Diagnosis not present

## 2023-10-28 NOTE — Progress Notes (Signed)
Stacy Hull is a 52 y.o. female presents to the office today for flu shot and shingles vaccine per physician's orders. Injection was administered Intramuscular Left deltoid.   Patient's next injection due 4-6 months, appt made? no Called patient and asked that she give [place] a call to schedule her next shingles vaccine.   Lenard Simmer

## 2023-11-04 ENCOUNTER — Other Ambulatory Visit: Payer: Self-pay | Admitting: Family Medicine

## 2023-11-04 ENCOUNTER — Telehealth: Payer: BC Managed Care – PPO | Admitting: Family Medicine

## 2023-11-04 ENCOUNTER — Encounter: Payer: Self-pay | Admitting: Family Medicine

## 2023-11-04 DIAGNOSIS — B029 Zoster without complications: Secondary | ICD-10-CM

## 2023-11-04 DIAGNOSIS — F331 Major depressive disorder, recurrent, moderate: Secondary | ICD-10-CM

## 2023-11-04 DIAGNOSIS — F411 Generalized anxiety disorder: Secondary | ICD-10-CM

## 2023-11-04 DIAGNOSIS — E1165 Type 2 diabetes mellitus with hyperglycemia: Secondary | ICD-10-CM

## 2023-11-04 MED ORDER — IBUPROFEN 800 MG PO TABS: 400.0000 mg | ORAL_TABLET | Freq: Three times a day (TID) | ORAL | 2 refills | Status: AC | PRN

## 2023-11-04 MED ORDER — ALPRAZOLAM 0.5 MG PO TABS: ORAL_TABLET | ORAL | 0 refills | Status: DC

## 2023-11-04 MED ORDER — TRAZODONE HCL 50 MG PO TABS: ORAL_TABLET | ORAL | 0 refills | Status: DC

## 2023-11-04 NOTE — Telephone Encounter (Signed)
Requested Prescriptions   Pending Prescriptions Disp Refills   ALPRAZolam (XANAX) 0.5 MG tablet [Pharmacy Med Name: ALPRAZOLAM 0.5MG  TABLETS] 30 tablet     Sig: TAKE 1 TABLET BY MOUTH DAILY AS NEEDED FOR ANXIETY     Date of patient request: 11/04/23 Last office visit: 10/05/2023 Upcoming visit: 11/04/2023 Date of last refill: 04/22/2023 Last refill amount: 30

## 2023-11-04 NOTE — Progress Notes (Signed)
Virtual Visit via Video Note  I connected with Stacy Hull on 11/04/23 at 1:16 PM by a video enabled telemedicine application and verified that I am speaking with the correct person using two identifiers.  Patient location: work, in private area by self.  My location: office [place].    I discussed the limitations, risks, security and privacy concerns of performing an evaluation and management service by telephone and the availability of in person appointments. I also discussed with the patient that there may be a patient responsible charge related to this service. The patient expressed understanding and agreed to proceed, consent obtained  Chief complaint:  Chief Complaint  Patient presents with   Medical Management of Chronic Issues    Pt is doing okay, wants to ensure refills for today     History of Present Illness: Stacy Hull is a 52 y.o. female   Follow-up of November 4th visit  Anxiety Decreased control last visit, increased buspirone to 7.5 mg twice daily.  Continued on Celexa, trazodone and recommended follow-up with therapist. Feels much better on higher dose. Less agitation, happy with improved results. No new side effects. Exercising now.  Needs rf of xanax-  only once per week - less need with higher dose buspar.  Needs rf of trazodone.  Controlled substance database (PDMP) reviewed. No concerns appreciated. #30 on 04/22/23 of xanax.      11/04/2023    1:05 PM 03/28/2021    9:33 AM 01/18/2021   11:04 AM 06/12/2020    9:39 AM  GAD 7 : Generalized Anxiety Score  Nervous, Anxious, on Edge 1 2 1 1   Control/stop worrying 0 2 1 0  Worry too much - different things 0 3 1 1   Trouble relaxing 1 2 3 1   Restless 0 1 0 0  Easily annoyed or irritable 0 2 3 1   Afraid - awful might happen 0 0 1 0  Total GAD 7 Score 2 12 10 4   Anxiety Difficulty    Somewhat difficult   Diabetes: With hyperglycemia, uncontrolled by last A1c of 8.6.  Gastrointestinal  upset with metformin previously.  Chronic abdominal issues with IBD, ileitis.  Started on Mounjaro 2.5 mg once per week in November. Meds working great. Joined gym. Working out now. Feels better. More energy to exercise.  No n/v/abd pain. No neck swelling.  Stomach feels better. More normal BM's.  8# weight loss. Would like to remain in same dose for 1 more month.   Needs refill for ibuprofen 800mg  - takes 1/2 pill on occasion - sore joints at times with exercise. No daily use.   Lab Results  Component Value Date   HGBA1C 8.6 (H) 10/05/2023   HGBA1C 8.1 (H) 01/27/2023   HGBA1C 7.2 (H) 01/18/2021   Lab Results  Component Value Date   MICROALBUR <0.7 01/27/2023   LDLCALC 50 01/18/2021   CREATININE 0.54 10/05/2023      Patient Active Problem List   Diagnosis Date Noted   Rectal bleeding 04/26/2021   Rectal pain 04/26/2021   Ileitis, terminal (HCC) 04/26/2021   Serrated polyp of colon 04/26/2021   Acute proctitis 04/26/2021   Prolapsed internal hemorrhoids, grade 3 04/26/2021   Left corneal abrasion 01/09/2020   Controlled type 2 diabetes mellitus without complication, without long-term current use of insulin (HCC) 10/13/2019   GAD (generalized anxiety disorder) 03/22/2019   Sinus congestion 11/18/2017   Acute non-recurrent maxillary sinusitis 11/18/2017   S/P hysterectomy 11/21/2016   Vitamin  D deficiency 05/12/2016   Anxiety state 09/27/2007   Hyperlipidemia 03/18/2007   Depression 03/18/2007   Essential hypertension 03/18/2007   Past Medical History:  Diagnosis Date   Allergic rhinitis    Anxiety    Asthma    MILD    Depression    DM type 2 (diabetes mellitus, type 2) (HCC)    DIET CONTROLLED   GERD (gastroesophageal reflux disease)    takes TUMS   HTN (hypertension)    Hyperlipidemia    Hyperparathyroidism (HCC)    Pneumonia YRS AGO   Wears glasses    Past Surgical History:  Procedure Laterality Date   ABDOMINAL HYSTERECTOMY N/A 2017   CERVICAL CONE  BIOPSY  YRS AGO   CESAREAN SECTION  2005   CESAREAN SECTION N/A    [employer] 01/18/2021   EVALUATION UNDER ANESTHESIA WITH HEMORRHOIDECTOMY N/A 04/26/2021   Procedure: EXAM UNDER ANESTHESIA  WITH HEMORRHOIDECTOMY; HEMORRHOIDPEXY AND LIGATION;  Surgeon: Karie Soda, MD;  Location: [facility];  Service: General;  Laterality: N/A;   HERNIA REPAIR N/A    HYSTEROSCOPY WITH NOVASURE  11/26/2012   Procedure: HYSTEROSCOPY WITH NOVASURE;  Surgeon: Serita Kyle, MD;  Location: [facility];  Service: Gynecology;  Laterality: N/A;   LAPAROSCOPIC LYSIS OF ADHESIONS N/A 11/21/2016   Procedure: EXTENSIVE LAPAROSCOPIC LYSIS OF ADHESIONS;  Surgeon: Maxie Better, MD;  Location: [facility];  Service: Gynecology;  Laterality: N/A;   LAPAROSCOPY N/A 11/21/2016   Procedure: LAPAROSCOPY DIAGNOSTIC, LAPAROSCOPIC REMOVAL OF FOREIGN BODY;  Surgeon: Axel Filler, MD;  Location: [facility];  Service: General;  Laterality: N/A;   PARATHYROIDECTOMY  1999   benign   REPAIR VAGINAL CUFF N/A 12/14/2016   Procedure: REPAIR VAGINAL CUFF;  Surgeon: Olivia Mackie, MD;  Location: [facility];  Service: Gynecology;  Laterality: N/A;   ROBOTIC ASSISTED LAPAROSCOPIC OVARIAN CYSTECTOMY Right 10/01/2015   Procedure: ROBOTIC ASSISTED LAPAROSCOPIC BILATERAL SALPINGECTOMY WITH LYSIS OF ADHESIONS AND RIGHT ADNEXAL CYST;  Surgeon: Genia Del, MD;  Location: [facility];  Service: Gynecology;  Laterality: Right;  POSSIBLE robot .  DO NOT DRAPE THE ROBOT.....she will use the laparoscopic camera to start.     ROBOTIC ASSISTED TOTAL HYSTERECTOMY WITH SALPINGECTOMY N/A 11/21/2016   Procedure: ROBOTIC ASSISTED TOTAL HYSTERECTOMY WITH  EXTENSIVE LYSIS OF ADHESIONS;  Surgeon: Maxie Better, MD;  Location: [facility];  Service: Gynecology;  Laterality: N/A;   TUBAL LIGATION  YSR AGO   FALLOPIAN TUBES REMOIVED   UMBILICAL HERNIA REPAIR     WISDOM TOOTH EXTRACTION  AGE 59   Allergies  Allergen Reactions   Erythromycin Other (See  Comments)    Reaction:  GI upset    Prior to Admission medications   Medication Sig Start Date End Date Taking? Authorizing Provider  albuterol (PROVENTIL) (2.5 MG/3ML) 0.083% nebulizer solution Take 2.5 mg by nebulization. 12/05/22  Yes [provider]  albuterol (VENTOLIN HFA) 108 (90 Base) MCG/ACT inhaler Inhale 1-2 puffs into the lungs every 6 (six) hours as needed for wheezing or shortness of breath. 12/26/21  Yes Shade Flood, MD  ALPRAZolam Prudy Feeler) 0.5 MG tablet TAKE 1 TABLET BY MOUTH DAILY AS NEEDED FOR ANXIETY 04/22/23  Yes Shade Flood, MD  atorvastatin (LIPITOR) 20 MG tablet TAKE 1 TABLET(20 MG) BY MOUTH DAILY 01/02/23  Yes Shade Flood, MD  busPIRone (BUSPAR) 7.5 MG tablet Take 1 tablet (7.5 mg total) by mouth 2 (two) times daily. 10/05/23  Yes Shade Flood, MD  citalopram (CELEXA) 40 MG tablet TAKE  1 TABLET(40 MG) BY MOUTH DAILY 12/19/22  Yes Shade Flood, MD  dicyclomine (BENTYL) 20 MG tablet  12/19/21  Yes [provider]  hydrocortisone-pramoxine (ANALPRAM HC) 2.5-1 % rectal cream Place 1 application rectally 3 (three) times daily. 10/19/20  Yes Peyton Najjar, MD  ibuprofen (ADVIL) 800 MG tablet Take 1 tablet (800 mg total) by mouth every 8 (eight) hours as needed for moderate pain. Take with food 01/14/21  Yes Just, Azalee Course, FNP  lisinopril (ZESTRIL) 10 MG tablet TAKE 1 TABLET(10 MG) BY MOUTH DAILY 08/10/23  Yes Shade Flood, MD  lisinopril-hydrochlorothiazide (ZESTORETIC) 20-25 MG tablet TAKE 1 TABLET BY MOUTH DAILY 08/10/23  Yes Shade Flood, MD  Multiple Vitamin (MULTIVITAMIN WITH MINERALS) TABS tablet Take 1 tablet by mouth daily.   Yes [provider]  tirzepatide Greggory Keen) 2.5 MG/0.5ML Pen Inject 2.5 mg into the skin once a week. 10/05/23  Yes Shade Flood, MD  traZODone (DESYREL) 50 MG tablet TAKE 1 TO 2 TABLETS(50 TO 100 MG) BY MOUTH AT BEDTIME 09/02/23  Yes Shade Flood, MD  triamcinolone (NASACORT) 55 MCG/ACT  AERO nasal inhaler Place 2 sprays into the nose daily. 01/26/20  Yes Lezlie Lye, Meda Coffee, MD  Vitamin D, Ergocalciferol, (DRISDOL) 1.25 MG (50000 UNIT) CAPS capsule Take 1 capsule (50,000 Units total) by mouth every 7 (seven) days. 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 Walks 3x's weekly for exercise.   Social Determinants of Health   Financial Resource Strain: Not on file  Food Insecurity: Not on file  Transportation Needs: Not on file  Physical Activity: Not on file  Stress: Not on file  Social Connections: Not on file  Intimate Partner Violence: Not on file    Observations/Objective: There were no vitals filed for this visit. Nontoxic appearance on video, speaking in full sentences without respiratory distress, audible wheeze or stridor.  Appropriate responses, euthymic mood, affect mood congruent.  Does not appear to be responding to internal stimuli.  All questions answered with understanding of plan expressed  Assessment and Plan: Uncontrolled type 2 diabetes mellitus with hyperglycemia (HCC)  -Tolerating low-dose Mounjaro, would like to remain on same dose for now which I think is  reasonable with her weight loss and increased exercise, recheck A1c in 2 months.  She will let me know in the next 1 month if she is ready for increased dose to 5 mg.  Potential side effects discussed.  GAD (generalized anxiety disorder) - Plan: traZODone (DESYREL) 50 MG tablet, ALPRAZolam (XANAX) 0.5 MG tablet  Anxiety state - Plan: traZODone (DESYREL) 50 MG tablet Moderate episode of recurrent major depressive disorder (HCC) - Plan: traZODone (DESYREL) 50 MG tablet  -Anxiety improved.  Will continue same dose of BuSpar, Celexa, trazodone.  Has alprazolam, refill needed.  Infrequent use.  Okay to remain on same regimen.  Ibuprofen filled if needed, lowest effective dose and short-term dosing with RTC precautions if any persistent arthralgias/myalgias.  Of risk of SSRIs and NSAIDs.  Follow Up Instructions: Patient Instructions  Glad to hear things are going well with the medication changes from last visit.  Keep up the good work with exercise, I think that will continue to help with diabetes and anxiety treatment.  Send me an update through MyChart in the next 1 month on whether you want to increase the Mounjaro dose.  Office visit for repeat labs in 2 months.  Take care!    I discussed the assessment and treatment plan with the patient. The patient was provided an opportunity to ask questions and all were answered. The patient agreed with the plan and demonstrated an understanding of the instructions.   The patient was advised to call back or seek an in-person evaluation if the symptoms worsen or if the condition fails to improve as anticipated.   Shade Flood, MD

## 2023-11-04 NOTE — Progress Notes (Signed)
Called pt to schedule 2 month F/U, LM

## 2023-11-04 NOTE — Patient Instructions (Addendum)
Glad to hear things are going well with the medication changes from last visit.  Keep up the good work with exercise, I think that will continue to help with diabetes and anxiety treatment.  Send me an update through MyChart in the next 1 month on whether you want to increase the Mounjaro dose.  Office visit for repeat labs in 2 months.   I did refill the ibuprofen, but try to use that infrequently and with lowest effective dose.  There are some risks of stomach irritation or stomach bleeding with use of NSAIDs and SSRIs like Celexa so would prefer to use that only as needed.  If you notice any new abdominal symptoms or certainly any blood in the stool be seen right away due to that potential risk.  Let me know if you have questions.  Dr. Neva Seat

## 2023-11-05 ENCOUNTER — Ambulatory Visit: Payer: BC Managed Care – PPO | Admitting: Psychology

## 2023-11-05 DIAGNOSIS — F4323 Adjustment disorder with mixed anxiety and depressed mood: Secondary | ICD-10-CM | POA: Diagnosis not present

## 2023-11-05 NOTE — Progress Notes (Signed)
[facility] Counselor/Therapist Progress Note  Patient ID: Stacy Hull, MRN: [id number],    Date: 11/05/2023  Time Spent: 60 mins  start time: 1300     end time: 1400  Treatment Type: Individual Therapy  Reported Symptoms: Pt presents for session via Caregility video, granting consent for the session.  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  I shared with pt that I am in my office with no one else here either.  Many notes for this pt are still in Therapy Charts.  Mental Status Exam: Appearance:  Casual     Behavior: Appropriate  Motor: Normal  Speech/Language:  Clear and Coherent  Affect: Appropriate  Mood: normal  Thought process: normal  Thought content:   WNL  Sensory/Perceptual disturbances:   WNL  Orientation: oriented to person, place, and time/date  Attention: Good  Concentration: Good  Memory: WNL  Fund of knowledge:  Good  Insight:   Good  Judgment:  Good  Impulse Control: Good   Risk Assessment: Danger to Self:  No Self-injurious Behavior: No Danger to Others: No Duty to Warn:no Physical Aggression / Violence:No  Access to Firearms a concern: No  Gang Involvement:No   Subjective: Pt shares "We had a nice Thanksgiving with the kids; [name] had to work that day so the kids and I went to [employer] for lunch and we had lots to eat.  I am still taking my Monjauro and I have lost 8 pounds so far.  I am also working out at [employer] and have been swimming too.  I am feeling so much better just in this short time."  Pt has been very intentional about doing this stuff for herself as a mean of self care.  She shares she was athletic as a young person and feels good to be doing things that are good for her.  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  She submitted the info back in May.  Pt shares that she  really appreciates her new principal (Dr. Lissa Hoard) and she continues to "feel seen and heard in school now."  Pt is now looking into professional development opportunities for this summer and feels energized by that.  Pt shares that she has enjoyed her classes this semester (all 3 honors classes); she will not have any honors classes in the Spring semester.  Pt shares that Alan Mulder is doing OK at [employer]; Stark Klein is doing OK as a Printmaker as well.  Pt shares that there were times in the past when Stark Klein was engaging in self harm but she is not doing that now.  Pt still has concerns for her mother-in-law (Diasha Castleman's mom); she is 50 yo and they are trying to arrange for her to move here to be closer to them.  Pt continues to be frustrated with [name] and him not taking care of himself and not doing as much for the family as he could do.   Encouraged pt to continue with her self care activities and we will meet in 2 wks for a follow up session.    Interventions: Cognitive Behavioral Therapy  Diagnosis:Adjustment disorder with mixed anxiety and depressed mood  Plan: Treatment Plan Strengths/Abilities:  Intelligent, Intuitive, Willing to participate in therapy Treatment Preferences:  Outpatient Individual Therapy Statement of Needs:  Patient is  to use CBT, mindfulness and coping skills to help manage and/or decrease symptoms associated with their diagnosis. Symptoms:  Depressed/Irritable mood, worry, social withdrawal Problems Addressed:  Depressive thoughts, Sadness, Sleep issues, etc. Long Term Goals:  Pt to reduce overall level, frequency, and intensity of the feelings of depression/anxiety as evidenced by decreased irritability, negative self talk, and helpless feelings from 6 to 7 days/week to 0 to 1 days/week, per client report, for at least 3 consecutive months.  Progress: 30% Short Term Goals:  Pt to verbally express understanding of the relationship between feelings of depression/anxiety and their impact on  thinking patterns and behaviors.  Pt to verbalize an understanding of the role that distorted thinking plays in creating fears, excessive worry, and ruminations.  Progress: 30% Target Date:  04/22/2024 Frequency:  Bi-weekly Modality:  Cognitive Behavioral Therapy Interventions by Therapist:  Therapist will use CBT, Mindfulness exercises, Coping skills and Referrals, as needed by client. Client has verbally approved this treatment plan.  Karie Kirks, [facility]

## 2023-11-19 ENCOUNTER — Ambulatory Visit: Payer: BC Managed Care – PPO | Admitting: Psychology

## 2023-11-19 DIAGNOSIS — F4323 Adjustment disorder with mixed anxiety and depressed mood: Secondary | ICD-10-CM

## 2023-11-19 NOTE — Progress Notes (Signed)
[facility] Counselor/Therapist Progress Note  Patient ID: Stacy Hull, MRN: [id number],    Date: 11/19/2023  Time Spent: 60 mins  start time: 1300     end time: 1400  Treatment Type: Individual Therapy  Reported Symptoms: Pt presents for session via Caregility video, granting consent for the session.  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  I shared with pt that I am in my office with no one else here either.  Many notes for this pt are still in Therapy Charts.  Mental Status Exam: Appearance:  Casual     Behavior: Appropriate  Motor: Normal  Speech/Language:  Clear and Coherent  Affect: Appropriate  Mood: normal  Thought process: normal  Thought content:   WNL  Sensory/Perceptual disturbances:   WNL  Orientation: oriented to person, place, and time/date  Attention: Good  Concentration: Good  Memory: WNL  Fund of knowledge:  Good  Insight:   Good  Judgment:  Good  Impulse Control: Good   Risk Assessment: Danger to Self:  No Self-injurious Behavior: No Danger to Others: No Duty to Warn:no Physical Aggression / Violence:No  Access to Firearms a concern: No  Gang Involvement:No   Subjective: Pt shares "We have one more day of school before the Winter break.  I have such good students (all honors classes) this semester and I really appreciate that."  Pt shares that she has a single difficult student and that student often takes pt's attention away from the rest of the class.  Pt is continuing to go to the gym regularly (weights and swimming); she continues to lose weight (10 pounds so far) and believes the [facility] and the working out is helping her; she feels better with her ability to move better.  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  Pt shares that Alan Mulder had final exams last week and he did well on them.  One of Liam's professors told him that he might need  to get tested for ADHD and/or autism.  Alan Mulder is planning to transfer to [employer] next year.  Pt is aware that this testing might be beneficial for him in terms of learning new ways to study.  Pt has been going to church regularly as well.  Encouraged pt to continue with her self care activities and we will meet in 2 wks for a follow up session.    Interventions: Cognitive Behavioral Therapy  Diagnosis:Adjustment disorder with mixed anxiety and depressed mood  Plan: Treatment Plan Strengths/Abilities:  Intelligent, Intuitive, Willing to participate in therapy Treatment Preferences:  Outpatient Individual Therapy Statement of Needs:  Patient is to use CBT, mindfulness and coping skills to help manage and/or decrease symptoms associated with their diagnosis. Symptoms:  Depressed/Irritable mood, worry, social withdrawal Problems Addressed:  Depressive thoughts, Sadness, Sleep issues, etc. Long Term Goals:  Pt to reduce overall level, frequency, and intensity of the feelings of depression/anxiety as evidenced by decreased irritability, negative self talk, and helpless feelings from 6 to 7 days/week to 0 to 1 days/week, per client report, for at least 3 consecutive months.  Progress: 30% Short Term Goals:  Pt to verbally express understanding of the relationship between feelings of depression/anxiety and their impact on thinking patterns and behaviors.  Pt to verbalize an understanding of the role that distorted thinking plays in creating fears, excessive worry, and ruminations.  Progress: 30% Target Date:  04/22/2024 Frequency:  Bi-weekly Modality:  Cognitive Behavioral Therapy Interventions by Therapist:  Therapist will use CBT, Mindfulness exercises, Coping skills and Referrals, as needed by client. Client has verbally approved this treatment plan.  Karie Kirks, [facility]

## 2023-11-20 ENCOUNTER — Telehealth: Payer: Self-pay

## 2023-11-20 NOTE — Telephone Encounter (Signed)
Patient is requesting an increase in [facility] please advise

## 2023-11-20 NOTE — Telephone Encounter (Signed)
Copied from CRM [id number]. Topic: Clinical - Medication Question >> Nov 20, 2023  1:11 PM Stacy Hull wrote: Reason for CRM: pt called in because she would like to up her dosage in her rx tirzepatide [facility]) 2.5 MG/0.5ML Pen

## 2023-11-21 MED ORDER — TIRZEPATIDE 5 MG/0.5ML ~~LOC~~ SOAJ: 5.0000 mg | SUBCUTANEOUS | 1 refills | Status: DC

## 2023-11-21 NOTE — Addendum Note (Signed)
Addended by: Meredith Staggers R on: 11/21/2023 11:06 AM   Modules accepted: Orders

## 2023-11-21 NOTE — Telephone Encounter (Signed)
We discussed option of increased dose at her December visit with 39-month follow-up.  I will send in new dose of Mounjaro, keep follow-up as planned.

## 2023-12-10 ENCOUNTER — Ambulatory Visit (INDEPENDENT_AMBULATORY_CARE_PROVIDER_SITE_OTHER): Payer: Self-pay | Admitting: Psychology

## 2023-12-10 DIAGNOSIS — F4323 Adjustment disorder with mixed anxiety and depressed mood: Secondary | ICD-10-CM

## 2023-12-10 NOTE — Progress Notes (Signed)
 [facility] Counselor/Therapist Progress Note  Patient ID: Stacy Hull, MRN: [id number],    Date: 12/10/2023  Time Spent: 60 mins  start time: 1300     end time: 1400  Treatment Type: Individual Therapy  Reported Symptoms: Pt presents for session via Caregility video, granting consent for the session.  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  I shared with pt that I am in my office with no one else here either.  Many notes for this pt are still in Therapy Charts.  Mental Status Exam: Appearance:  Casual     Behavior: Appropriate  Motor: Normal  Speech/Language:  Clear and Coherent  Affect: Appropriate  Mood: normal  Thought process: normal  Thought content:   WNL  Sensory/Perceptual disturbances:   WNL  Orientation: oriented to person, place, and time/date  Attention: Good  Concentration: Good  Memory: WNL  Fund of knowledge:  Good  Insight:   Good  Judgment:  Good  Impulse Control: Good   Risk Assessment: Danger to Self:  No Self-injurious Behavior: No Danger to Others: No Duty to Warn:no Physical Aggression / Violence:No  Access to Firearms a concern: No  Gang Involvement:No   Subjective: Pt shares I have been pretty good since our last session.  I had a great Winter break; I was chillaxing, I worked out a lot over the break and that helped with my anxiety a lot.  Pt shares that her principal came and observed her the last class of the last day before Winter break and she had her post observation conference with him and that was positive.  Pt shares Christmas was good; she shares, I did not get anything from [place] or from the kids but I did self-care the crap out of me with gifts.  Pt shares that her mother-in-law (Essex Perry's mom, [place] ) continues to call her and tell her that she is afraid to continue to live by herself.  Pt would support [name] moving to [place] to live with his mom until he can get her moved here to [place] to be closer to  them.  Pt shares that [name] is not willing to engage with her or his mom about what to do next with his mom.  Suggested pt contact the [facility] EAP service for a joint session for her and [name] to discuss next steps for his mom.  Pt shares that she continues to lose weight and believes the Monjauro and the working out is helping her; she feels better with her ability to move better.  Pt has been going to church regularly as well.  Encouraged pt to continue with her self care activities and we will meet in 4 wks for a follow up session, due to her end of semester activities.    Interventions: Cognitive Behavioral Therapy  Diagnosis:Adjustment disorder with mixed anxiety and depressed mood  Plan: Treatment Plan Strengths/Abilities:  Intelligent, Intuitive, Willing to participate in therapy Treatment Preferences:  Outpatient Individual Therapy Statement of Needs:  Patient is to use CBT, mindfulness and coping skills to help manage and/or decrease symptoms associated with their diagnosis. Symptoms:  Depressed/Irritable mood, worry, social withdrawal Problems Addressed:  Depressive thoughts, Sadness, Sleep issues, etc. Long Term Goals:  Pt to reduce overall level, frequency, and intensity of the feelings of depression/anxiety as evidenced by decreased irritability, negative self talk, and helpless feelings from 6 to 7 days/week to 0 to 1 days/week, per client report, for at least 3  consecutive months.  Progress: 30% Short Term Goals:  Pt to verbally express understanding of the relationship between feelings of depression/anxiety and their impact on thinking patterns and behaviors.  Pt to verbalize an understanding of the role that distorted thinking plays in creating fears, excessive worry, and ruminations.  Progress: 30% Target Date:  04/22/2024 Frequency:  Bi-weekly Modality:  Cognitive Behavioral Therapy Interventions by Therapist:  Therapist will use CBT, Mindfulness exercises, Coping skills and  Referrals, as needed by client. Client has verbally approved this treatment plan.  [name], [facility]

## 2023-12-18 ENCOUNTER — Other Ambulatory Visit: Payer: Self-pay | Admitting: Family Medicine

## 2023-12-18 DIAGNOSIS — F331 Major depressive disorder, recurrent, moderate: Secondary | ICD-10-CM

## 2023-12-18 DIAGNOSIS — F411 Generalized anxiety disorder: Secondary | ICD-10-CM

## 2023-12-18 NOTE — Telephone Encounter (Signed)
Trazodone refilled 11/04/2023.  1 to 2 tablets at bedtime.  Refill ordered.

## 2023-12-18 NOTE — Telephone Encounter (Signed)
Requested Prescriptions   Pending Prescriptions Disp Refills   traZODone (DESYREL) 50 MG tablet [Pharmacy Med Name: TRAZODONE 50MG  TABLETS] 90 tablet 0    Sig: TAKE 1 TO 2 TABLETS(50 TO 100 MG) BY MOUTH AT BEDTIME     Date of patient request: 12/18/2023 Last office visit: 10/05/2023 Upcoming visit: 01/06/2024 Date of last refill: 11/04/2023 Last refill amount: 90

## 2023-12-22 ENCOUNTER — Telehealth: Payer: Self-pay | Admitting: Family Medicine

## 2023-12-22 NOTE — Telephone Encounter (Signed)
Called pt to check in with see if she got new insurance wasn't able leave message VM full.

## 2024-01-06 ENCOUNTER — Ambulatory Visit: Payer: 59 | Admitting: Family Medicine

## 2024-01-06 ENCOUNTER — Encounter: Payer: Self-pay | Admitting: Family Medicine

## 2024-01-06 VITALS — BP 128/70 | HR 93 | Temp 98.2°F | Ht 67.0 in | Wt 190.6 lb

## 2024-01-06 DIAGNOSIS — I10 Essential (primary) hypertension: Secondary | ICD-10-CM

## 2024-01-06 DIAGNOSIS — E7800 Pure hypercholesterolemia, unspecified: Secondary | ICD-10-CM

## 2024-01-06 DIAGNOSIS — E785 Hyperlipidemia, unspecified: Secondary | ICD-10-CM

## 2024-01-06 DIAGNOSIS — E1165 Type 2 diabetes mellitus with hyperglycemia: Secondary | ICD-10-CM

## 2024-01-06 DIAGNOSIS — F411 Generalized anxiety disorder: Secondary | ICD-10-CM

## 2024-01-06 DIAGNOSIS — E559 Vitamin D deficiency, unspecified: Secondary | ICD-10-CM | POA: Diagnosis not present

## 2024-01-06 DIAGNOSIS — Z23 Encounter for immunization: Secondary | ICD-10-CM | POA: Diagnosis not present

## 2024-01-06 LAB — COMPREHENSIVE METABOLIC PANEL
ALT: 18 U/L (ref 0–35)
AST: 20 U/L (ref 0–37)
Albumin: 4.9 g/dL (ref 3.5–5.2)
Alkaline Phosphatase: 48 U/L (ref 39–117)
BUN: 12 mg/dL (ref 6–23)
CO2: 26 meq/L (ref 19–32)
Calcium: 10.2 mg/dL (ref 8.4–10.5)
Chloride: 98 meq/L (ref 96–112)
Creatinine, Ser: 0.56 mg/dL (ref 0.40–1.20)
GFR: 106.43 mL/min (ref >60.00)
Glucose, Bld: 113 mg/dL — ABNORMAL HIGH (ref 70–99)
Potassium: 3.7 meq/L (ref 3.5–5.1)
Sodium: 137 meq/L (ref 135–145)
Total Bilirubin: 0.5 mg/dL (ref 0.2–1.2)
Total Protein: 8.1 g/dL (ref 6.0–8.3)

## 2024-01-06 LAB — MICROALBUMIN / CREATININE URINE RATIO
Creatinine,U: 37.8 mg/dL
Microalb Creat Ratio: 1.9 mg/g (ref 0.0–30.0)
Microalb, Ur: <0.7 mg/dL (ref 0.0–1.9)

## 2024-01-06 LAB — LIPID PANEL
Cholesterol: 175 mg/dL (ref 0–200)
HDL: 73.2 mg/dL (ref >39.00)
LDL Cholesterol: 59 mg/dL (ref 0–99)
NonHDL: 101.94
Total CHOL/HDL Ratio: 2
Triglycerides: 216 mg/dL — ABNORMAL HIGH (ref 0.0–149.0)
VLDL: 43.2 mg/dL — ABNORMAL HIGH (ref 0.0–40.0)

## 2024-01-06 LAB — VITAMIN D 25 HYDROXY (VIT D DEFICIENCY, FRACTURES): VITD: 46.23 ng/mL (ref 30.00–100.00)

## 2024-01-06 LAB — HEMOGLOBIN A1C: Hgb A1c MFr Bld: 7 % — ABNORMAL HIGH (ref 4.6–6.5)

## 2024-01-06 MED ORDER — LISINOPRIL 10 MG PO TABS: 10.0000 mg | ORAL_TABLET | Freq: Every day | ORAL | 1 refills | Status: DC

## 2024-01-06 MED ORDER — LISINOPRIL-HYDROCHLOROTHIAZIDE 20-25 MG PO TABS: 1.0000 | ORAL_TABLET | Freq: Every day | ORAL | 1 refills | Status: DC

## 2024-01-06 MED ORDER — ATORVASTATIN CALCIUM 20 MG PO TABS: ORAL_TABLET | ORAL | 3 refills | Status: DC

## 2024-01-06 NOTE — Patient Instructions (Addendum)
 Thank you for coming in today.  Glad to hear the current medications are working well, no changes today.  If any concerns on labs I will let you know. Take care!

## 2024-01-06 NOTE — Progress Notes (Signed)
 Subjective:  Patient ID: Stacy Hull, female    DOB: 05/19/1971  Age: 52 y.o. MRN: [id number]  CC:  Chief Complaint  Patient presents with   Medical Management of Chronic Issues    No concerns or questions     HPI Stacy Hull presents for   Diabetes:  With hyperglycemia.  Unfortunately did not tolerate metformin  due to gastrointestinal upset, chronic abdominal issues with IBD, ileitis.  Started on Mounjaro  in November that was working well.  Less gastrointestinal issues off metformin .  8 pound weight loss at her December visit.   On Mounjaro  5mg  weekly, no n/v/side eftects. Feels good on this med.  Joined gym, swimming for exercise.  She is on ACE inhibitor with lisinopril  statin with Lipitor.. Home readings  - none.   Microalbumin: normal ratio 01/2023 Optho, foot exam, pneumovax:  Optho Stacy Hull vision last summer.  Immunization History  Administered Date(s) Administered   Influenza, Seasonal, Injecte, Preservative Fre 10/28/2023   Influenza,inj,Quad PF,6+ Mos 08/26/2013, 09/11/2016, 09/07/2018, 08/25/2019, 10/19/2020   Influenza-Unspecified 10/09/2014   PFIZER(Purple Top)SARS-COV-2 Vaccination 02/04/2020, 02/25/2020   Pneumococcal Polysaccharide-23 11/22/2016   Tdap 08/18/2012   Zoster Recombinant(Shingrix ) 10/28/2023   Tdap today with prevnar and shingrix .   Wt Readings from Last 3 Encounters:  01/06/24 190 lb 9.6 oz (86.5 kg)  10/05/23 195 lb (88.5 kg)  01/22/23 194 lb (88 kg)    Lab Results  Component Value Date   HGBA1C 8.6 (H) 10/05/2023   HGBA1C 8.1 (H) 01/27/2023   HGBA1C 7.2 (H) 01/18/2021   Lab Results  Component Value Date   MICROALBUR <0.7 01/27/2023   LDLCALC 50 01/18/2021   CREATININE 0.54 10/05/2023   Last vitamin D  Lab Results  Component Value Date   VD25OH 28.89 (L) 10/05/2023  Borderline in November, only on over-the-counter medications at this time, prescription strength in the past.  Hyperlipidemia: Lipitor 20mg  every  day. No new myalgias/side effects.  Lab Results  Component Value Date   CHOL 159 01/27/2023   HDL 56.80 01/27/2023   LDLCALC 50 01/18/2021   LDLDIRECT 82.0 01/27/2023   TRIG 221.0 (H) 01/27/2023   CHOLHDL 3 01/27/2023   Lab Results  Component Value Date   ALT 19 10/05/2023   AST 20 10/05/2023   ALKPHOS 52 10/05/2023   BILITOT 0.5 10/05/2023   Hypertension: Total dose 30mg  lisinopril , hydrochlorothiazide  25mg  with combo of lisinopril  and lisinopril  hydrochlorothiazide . No home readings.  No new side effects.  BP Readings from Last 3 Encounters:  01/06/24 128/70  10/05/23 130/82  01/22/23 (!) 142/84   Lab Results  Component Value Date   CREATININE 0.54 10/05/2023       Anxiety Decreased control in November.  Buspirone  increased to 7.5 mg twice daily, continue with Celexa , and trazodone  and planned therapist follow-up.  She was feeling better at follow-up visit December 4.  Rare need for Xanax , once per week at that time.  Less needed with higher dose of BuSpar . Still doing well - happy with current meds. Doing much better.  Xanax  - rare - once every few weeks.      01/06/2024    1:45 PM 11/04/2023    1:05 PM 03/28/2021    9:33 AM 01/18/2021   11:04 AM  GAD 7 : Generalized Anxiety Score  Nervous, Anxious, on Edge 0 1 2 1   Control/stop worrying 0 0 2 1  Worry too much - different things 0 0 3 1  Trouble relaxing 0 1 2 3  Restless 0 0 1 0  Easily annoyed or irritable 0 0 2 3  Afraid - awful might happen 0 0 0 1  Total GAD 7 Score 0 2 12 10        01/06/2024    1:45 PM 11/04/2023    1:05 PM 12/26/2021    4:44 PM 02/19/2021   11:47 AM 01/18/2021   11:02 AM  Depression screen PHQ 2/9  Decreased Interest 0 1 0 0 0  Down, Depressed, Hopeless 0 1 0 0 2  PHQ - 2 Score 0 2 0 0 2  Altered sleeping 1 0 0  0  Tired, decreased energy 1 0 0  1  Change in appetite 0 0 0  0  Feeling bad or failure about yourself  0 0 0  3  Trouble concentrating 0 0 0  1  Moving slowly or  fidgety/restless 0 0 0  0  Suicidal thoughts 0 0 0  0  PHQ-9 Score 2 2 0  7  Difficult doing work/chores   Not difficult at all        History Patient Active Problem List   Diagnosis Date Noted   Rectal bleeding 04/26/2021   Rectal pain 04/26/2021   Ileitis, terminal (HCC) 04/26/2021   Serrated polyp of colon 04/26/2021   Acute proctitis 04/26/2021   Prolapsed internal hemorrhoids, grade 3 04/26/2021   Left corneal abrasion 01/09/2020   Controlled type 2 diabetes mellitus without complication, without long-term current use of insulin  (HCC) 10/13/2019   GAD (generalized anxiety disorder) 03/22/2019   Sinus congestion 11/18/2017   Acute non-recurrent maxillary sinusitis 11/18/2017   S/P hysterectomy 11/21/2016   Vitamin D  deficiency 05/12/2016   Anxiety state 09/27/2007   Hyperlipidemia 03/18/2007   Depression 03/18/2007   Essential hypertension 03/18/2007   Past Medical History:  Diagnosis Date   Allergic rhinitis    Anxiety    Asthma    MILD    Depression    DM type 2 (diabetes mellitus, type 2) (HCC)    DIET CONTROLLED   GERD (gastroesophageal reflux disease)    takes TUMS   HTN (hypertension)    Hyperlipidemia    Hyperparathyroidism (HCC)    Pneumonia YRS AGO   Wears glasses    Past Surgical History:  Procedure Laterality Date   ABDOMINAL HYSTERECTOMY N/A 2017   CERVICAL CONE BIOPSY  YRS AGO   CESAREAN SECTION  2005   CESAREAN SECTION N/A    Phreesia 01/18/2021   EVALUATION UNDER ANESTHESIA WITH HEMORRHOIDECTOMY N/A 04/26/2021   Procedure: EXAM UNDER ANESTHESIA  WITH HEMORRHOIDECTOMY; HEMORRHOIDPEXY AND LIGATION;  Surgeon: Sheldon Standing, MD;  Location: [facility];  Service: General;  Laterality: N/A;   HERNIA REPAIR N/A    HYSTEROSCOPY WITH NOVASURE  11/26/2012   Procedure: HYSTEROSCOPY WITH NOVASURE;  Surgeon: Dickie DELENA Carder, MD;  Location: [facility];  Service: Gynecology;  Laterality: N/A;   LAPAROSCOPIC LYSIS OF ADHESIONS N/A 11/21/2016    Procedure: EXTENSIVE LAPAROSCOPIC LYSIS OF ADHESIONS;  Surgeon: Dickie Carder, MD;  Location: [facility];  Service: Gynecology;  Laterality: N/A;   LAPAROSCOPY N/A 11/21/2016   Procedure: LAPAROSCOPY DIAGNOSTIC, LAPAROSCOPIC REMOVAL OF FOREIGN BODY;  Surgeon: Lynda Leos, MD;  Location: [facility];  Service: General;  Laterality: N/A;   PARATHYROIDECTOMY  1999   benign   REPAIR VAGINAL CUFF N/A 12/14/2016   Procedure: REPAIR VAGINAL CUFF;  Surgeon: Charlie Flowers, MD;  Location: [facility];  Service: Gynecology;  Laterality: N/A;   ROBOTIC ASSISTED  LAPAROSCOPIC OVARIAN CYSTECTOMY Right 10/01/2015   Procedure: ROBOTIC ASSISTED LAPAROSCOPIC BILATERAL SALPINGECTOMY WITH LYSIS OF ADHESIONS AND RIGHT ADNEXAL CYST;  Surgeon: Marie-Lyne Lavoie, MD;  Location: [facility];  Service: Gynecology;  Laterality: Right;  POSSIBLE robot .  DO NOT DRAPE THE ROBOT.....she will use the laparoscopic camera to start.     ROBOTIC ASSISTED TOTAL HYSTERECTOMY WITH SALPINGECTOMY N/A 11/21/2016   Procedure: ROBOTIC ASSISTED TOTAL HYSTERECTOMY WITH  EXTENSIVE LYSIS OF ADHESIONS;  Surgeon: Dickie Carder, MD;  Location: [facility];  Service: Gynecology;  Laterality: N/A;   TUBAL LIGATION  YSR AGO   FALLOPIAN TUBES REMOIVED   UMBILICAL HERNIA REPAIR     WISDOM TOOTH EXTRACTION  AGE 31   Allergies  Allergen Reactions   Erythromycin  Other (See Comments)    Reaction:  GI upset    Prior to Admission medications   Medication Sig Start Date End Date Taking? Authorizing Provider  albuterol  (PROVENTIL ) (2.5 MG/3ML) 0.083% nebulizer solution Take 2.5 mg by nebulization. 12/05/22  Yes [provider]  albuterol  (VENTOLIN  HFA) 108 (90 Base) MCG/ACT inhaler Inhale 1-2 puffs into the lungs every 6 (six) hours as needed for wheezing or shortness of breath. 12/26/21  Yes Levora Reyes SAUNDERS, MD  ALPRAZolam  (XANAX ) 0.5 MG tablet TAKE 1 TABLET BY MOUTH DAILY AS NEEDED FOR ANXIETY 11/04/23  Yes Levora Reyes SAUNDERS, MD  atorvastatin   (LIPITOR) 20 MG tablet TAKE 1 TABLET(20 MG) BY MOUTH DAILY 01/02/23  Yes Levora Reyes SAUNDERS, MD  busPIRone  (BUSPAR ) 7.5 MG tablet Take 1 tablet (7.5 mg total) by mouth 2 (two) times daily. 10/05/23  Yes Levora Reyes SAUNDERS, MD  citalopram  (CELEXA ) 40 MG tablet TAKE 1 TABLET(40 MG) BY MOUTH DAILY 12/19/22  Yes Levora Reyes SAUNDERS, MD  dicyclomine (BENTYL) 20 MG tablet  12/19/21  Yes [provider]  hydrocortisone -pramoxine (ANALPRAM HC) 2.5-1 % rectal cream Place 1 application rectally 3 (three) times daily. 10/19/20  Yes Tish Alm DEL, MD  ibuprofen  (ADVIL ) 800 MG tablet Take 0.5-1 tablets (400-800 mg total) by mouth every 8 (eight) hours as needed for moderate pain (pain score 4-6) (Take with food.). Take with food 11/04/23  Yes Levora Reyes SAUNDERS, MD  lisinopril  (ZESTRIL ) 10 MG tablet TAKE 1 TABLET(10 MG) BY MOUTH DAILY 08/10/23  Yes Levora Reyes SAUNDERS, MD  lisinopril -hydrochlorothiazide  (ZESTORETIC ) 20-25 MG tablet TAKE 1 TABLET BY MOUTH DAILY 08/10/23  Yes Levora Reyes SAUNDERS, MD  Multiple Vitamin (MULTIVITAMIN WITH MINERALS) TABS tablet Take 1 tablet by mouth daily.   Yes [provider]  tirzepatide  (MOUNJARO ) 5 MG/0.5ML Pen Inject 5 mg into the skin once a week. 11/21/23  Yes Levora Reyes SAUNDERS, MD  traZODone  (DESYREL ) 50 MG tablet TAKE 1 TO 2 TABLETS(50 TO 100 MG) BY MOUTH AT BEDTIME 12/18/23  Yes Levora Reyes SAUNDERS, MD  triamcinolone  (NASACORT ) 55 MCG/ACT AERO nasal inhaler Place 2 sprays into the nose daily. 01/26/20  Yes Melonie Colonel, Mikel HERO, MD  Vitamin D , Ergocalciferol , (DRISDOL ) 1.25 MG (50000 UNIT) CAPS capsule Take 1 capsule (50,000 Units total) by mouth every 7 (seven) days. 01/22/23  Yes Levora Reyes SAUNDERS, MD   Social History   Socioeconomic History   Marital status: Married    Spouse name: Twanisha Foulk   Number of children: 2   Years of education: Master's +   Highest education level: Not on file  Occupational History   Occupation: HIGH SCHOOL ENGLISH     Employer:  [employer]    Comment: [employer]  Tobacco Use   Smoking status: Never   Smokeless tobacco: Never  Vaping Use   Vaping status: Never Used  Substance and Sexual Activity   Alcohol use: Not Currently    Alcohol/week: 4.0 standard drinks of alcohol    Types: 4 Standard drinks or equivalent per week   Drug use: No   Sexual activity: Yes    Partners: Male    Birth control/protection: Surgical  Other Topics Concern   Not on file  Social History Narrative   Lives with her husband and their 2 children. Walks 3x's weekly for exercise.   Social Drivers of Corporate Investment Banker Strain: Not on file  Food Insecurity: Not on file  Transportation Needs: Not on file  Physical Activity: Not on file  Stress: Not on file  Social Connections: Not on file  Intimate Partner Violence: Not on file    Review of Systems  Constitutional:  Negative for fatigue and unexpected weight change.  Respiratory:  Negative for chest tightness and shortness of breath.   Cardiovascular:  Negative for chest pain, palpitations and leg swelling.  Gastrointestinal:  Negative for abdominal pain and blood in stool.  Neurological:  Negative for dizziness, syncope, light-headedness and headaches.     Objective:   Vitals:   01/06/24 1347  BP: 128/70  Pulse: 93  Temp: 98.2 F (36.8 C)  TempSrc: Temporal  SpO2: 97%  Weight: 190 lb 9.6 oz (86.5 kg)  Height: 5' 7 (1.702 m)     Physical Exam Vitals reviewed.  Constitutional:      Appearance: Normal appearance. She is well-developed.  HENT:     Head: Normocephalic and atraumatic.  Eyes:     Conjunctiva/sclera: Conjunctivae normal.     Pupils: Pupils are equal, round, and reactive to light.  Neck:     Vascular: No carotid bruit.  Cardiovascular:     Rate and Rhythm: Normal rate and regular rhythm.     Heart sounds: Normal heart sounds.  Pulmonary:     Effort: Pulmonary effort is normal.     Breath sounds: Normal  breath sounds.  Abdominal:     Palpations: Abdomen is soft. There is no pulsatile mass.     Tenderness: There is no abdominal tenderness.  Musculoskeletal:     Right lower leg: No edema.     Left lower leg: No edema.  Skin:    General: Skin is warm and dry.  Neurological:     Mental Status: She is alert and oriented to person, place, and time.  Psychiatric:        Mood and Affect: Mood normal.        Behavior: Behavior normal.     Assessment & Plan:  CAILEIGH CANCHE is a 53 y.o. female . Hyperlipidemia, unspecified hyperlipidemia type - Plan: Lipid panel  -Check labs, adjust plan accordingly, continue same statin dose for now  Uncontrolled type 2 diabetes mellitus with hyperglycemia (HCC) - Plan: Hemoglobin A1c, Comprehensive metabolic panel  -Tolerating Xarelto, check A1c and adjust dose accordingly. Need for shingles vaccine - Plan: Varicella-zoster vaccine IM  Need for vaccination against Streptococcus pneumoniae - Plan: Pneumococcal conjugate vaccine 20-valent (Prevnar 20)  Essential hypertension - Plan: lisinopril  (ZESTRIL ) 10 MG tablet, lisinopril -hydrochlorothiazide  (ZESTORETIC ) 20-25 MG tablet  -Stable on current regimen, continue same, labs as above.  GAD (generalized anxiety disorder)  -Stable with higher dose of BuSpar , continue same.  No change in other meds at this time.  Pure hypercholesterolemia - Plan: atorvastatin  (  LIPITOR) 20 MG tablet As above  Vitamin D  deficiency - Plan: Vitamin D  (25 hydroxy) Over-the-counter supplementation only at this time, check labs and adjust plan accordingly.  Meds ordered this encounter  Medications   atorvastatin  (LIPITOR) 20 MG tablet    Sig: TAKE 1 TABLET(20 MG) BY MOUTH DAILY    Dispense:  90 tablet    Refill:  3   lisinopril  (ZESTRIL ) 10 MG tablet    Sig: Take 1 tablet (10 mg total) by mouth daily. TAKE 1 TABLET(10 MG) BY MOUTH DAILY in addition to 20/25mg  combination with hct.    Dispense:  90 tablet    Refill:   1   lisinopril -hydrochlorothiazide  (ZESTORETIC ) 20-25 MG tablet    Sig: Take 1 tablet by mouth daily.    Dispense:  90 tablet    Refill:  1   Patient Instructions  Thank you for coming in today.  Glad to hear the current medications are working well, no changes today.  If any concerns on labs I will let you know. Take care!        Signed,   Reyes Pines, MD [facility], [facility] 01/06/24 2:13 PM

## 2024-01-07 ENCOUNTER — Ambulatory Visit (INDEPENDENT_AMBULATORY_CARE_PROVIDER_SITE_OTHER): Payer: 59 | Admitting: Psychology

## 2024-01-07 DIAGNOSIS — F4323 Adjustment disorder with mixed anxiety and depressed mood: Secondary | ICD-10-CM | POA: Diagnosis not present

## 2024-01-07 NOTE — Progress Notes (Signed)
  Behavioral Health Counselor/Therapist Progress Note  Patient ID: Stacy Hull, MRN: [id number],    Date: 01/07/2024  Time Spent: 60 mins  start time: 1300     end time: 1400  Treatment Type: Individual Therapy  Reported Symptoms: Pt presents for session via Caregility video, granting consent for the session.  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  I shared with pt that I am in my office with no one else here either.  Many notes for this pt are still in Therapy Charts.  Mental Status Exam: Appearance:  Casual     Behavior: Appropriate  Motor: Normal  Speech/Language:  Clear and Coherent  Affect: Appropriate  Mood: normal  Thought process: normal  Thought content:   WNL  Sensory/Perceptual disturbances:   WNL  Orientation: oriented to person, place, and time/date  Attention: Good  Concentration: Good  Memory: WNL  Fund of knowledge:  Good  Insight:   Good  Judgment:  Good  Impulse Control: Good   Risk Assessment: Danger to Self:  No Self-injurious Behavior: No Danger to Others: No Duty to Warn:no Physical Aggression / Violence:No  Access to Firearms a concern: No  Gang Involvement:No   Subjective: Pt shares I have been pretty good since our last session.  I went to my PCP yesterday and got my shingles vaccine and the pneumonia vaccine and I am feeling a little rough today.  Pt shares that her new semester is going OK; she has a pretty good group of students even though they are not as capable as her kids from last semester.  Pt shares that [name] got her report card and she passed everything; she made an A in English, B in PE, and a D in [place] (But she passed).  Pt shares that [name] was sick at the beginning of the semester but she is better now and is getting her footing in her classes.  Pt shares that she has decided not to be as critical of [name] as she has been in the past.  She believes he is noticing that and they have been getting  along a little bit better.  [name] is now a sophomore at [employer]; he has failed a couple of classes so he is a bit farther from enrolling at [employer].  [facility] and feels good about that.  Pt shares that she continues to lose weight and believes the Monjauro and the working out is helping her; she feels better with her ability to move better.  Pt has been going to church regularly as well.  Encouraged pt to continue with her self care activities and we will meet in 2 wks for a follow up session.    Interventions: Cognitive Behavioral Therapy  Diagnosis:Adjustment disorder with mixed anxiety and depressed mood  Plan: Treatment Plan Strengths/Abilities:  Intelligent, Intuitive, Willing to participate in therapy Treatment Preferences:  Outpatient Individual Therapy Statement of Needs:  Patient is to use CBT, mindfulness and coping skills to help manage and/or decrease symptoms associated with their diagnosis. Symptoms:  Depressed/Irritable mood, worry, social withdrawal Problems Addressed:  Depressive thoughts, Sadness, Sleep issues, etc. Long Term Goals:  Pt to reduce overall level, frequency, and intensity of the feelings of depression/anxiety as evidenced by decreased irritability, negative self talk, and helpless feelings from 6 to 7 days/week to 0 to 1 days/week, per client report, for at least 3 consecutive  months.  Progress: 30% Short Term Goals:  Pt to verbally express understanding of the relationship between feelings of depression/anxiety and their impact on thinking patterns and behaviors.  Pt to verbalize an understanding of the role that distorted thinking plays in creating fears, excessive worry, and ruminations.  Progress: 30% Target Date:  04/22/2024 Frequency:  Bi-weekly Modality:  Cognitive Behavioral Therapy Interventions by Therapist:  Therapist will use CBT, Mindfulness exercises, Coping skills and Referrals, as needed by  client. Client has verbally approved this treatment plan.  [name], [facility]

## 2024-01-10 ENCOUNTER — Encounter: Payer: Self-pay | Admitting: Family Medicine

## 2024-01-14 ENCOUNTER — Other Ambulatory Visit: Payer: Self-pay | Admitting: Family Medicine

## 2024-01-14 DIAGNOSIS — F411 Generalized anxiety disorder: Secondary | ICD-10-CM

## 2024-01-14 DIAGNOSIS — F331 Major depressive disorder, recurrent, moderate: Secondary | ICD-10-CM

## 2024-01-22 ENCOUNTER — Ambulatory Visit: Payer: 59 | Admitting: Psychology

## 2024-02-08 ENCOUNTER — Other Ambulatory Visit: Payer: Self-pay | Admitting: Family Medicine

## 2024-02-08 DIAGNOSIS — F411 Generalized anxiety disorder: Secondary | ICD-10-CM

## 2024-02-08 DIAGNOSIS — F331 Major depressive disorder, recurrent, moderate: Secondary | ICD-10-CM

## 2024-02-08 DIAGNOSIS — E1165 Type 2 diabetes mellitus with hyperglycemia: Secondary | ICD-10-CM

## 2024-02-10 ENCOUNTER — Ambulatory Visit: Payer: 59 | Admitting: Psychology

## 2024-02-10 DIAGNOSIS — F4323 Adjustment disorder with mixed anxiety and depressed mood: Secondary | ICD-10-CM | POA: Diagnosis not present
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
## 2024-02-24 ENCOUNTER — Ambulatory Visit (INDEPENDENT_AMBULATORY_CARE_PROVIDER_SITE_OTHER): Admitting: Psychology

## 2024-02-24 DIAGNOSIS — F4323 Adjustment disorder with mixed anxiety and depressed mood: Secondary | ICD-10-CM | POA: Diagnosis not present

## 2024-02-24 NOTE — Progress Notes (Signed)
 [facility] Counselor/Therapist Progress Note  Patient ID: Stacy Hull, MRN: [id number],    Date: 02/24/2024  Time Spent: 60 mins  start time: 1300     end time: 1400  Treatment Type: Individual Therapy  Reported Symptoms: Pt presents for session via Caregility video, granting consent for the session.  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  I shared with pt that I am in my office with no one else here either.  Many notes for this pt are still in Therapy Charts.  Mental Status Exam: Appearance:  Casual     Behavior: Appropriate  Motor: Normal  Speech/Language:  Clear and Coherent  Affect: Appropriate  Mood: normal  Thought process: normal  Thought content:   WNL  Sensory/Perceptual disturbances:   WNL  Orientation: oriented to person, place, and time/date  Attention: Good  Concentration: Good  Memory: WNL  Fund of knowledge:  Good  Insight:   Good  Judgment:  Good  Impulse Control: Good   Risk Assessment: Danger to Self:  No Self-injurious Behavior: No Danger to Others: No Duty to Warn:no Physical Aggression / Violence:No  Access to Firearms a concern: No  Gang Involvement:No   Subjective: Pt shares "I have been pretty good since our last session.  I have not been as un-tethered as I have been in the past.  I feel like I need to get back to see a GYN but mine retired.  I need to get someone to see Stacy Hull as well for ADHD."  Talked with pt about Dr. Stevphen Rochester at [facility] and encouraged pt to look for a new GYN for her own healthcare.  Pt continues to take her Buspirone and Cymbalta daily.  Pt has picked back up swimming again and she knows that is so good for her.  She continues to reflect fondly on her time in [place] as a teenager and young adult.  Pt shares that she talked to her dad this morning because she had been missing him; she woke him up and that with his dementia made it difficult to talk with him this morning.  Pt again shares  her disappointment with [name] that he does not help her with family/house issues.  Talked with her about ways to continue to encourage [name] to engage with things the family needs.  Pt shares that she continues to take [facility] and has lost 15 pounds with it and her A1C has gone from 14 to 6.7 and she feels much better.  She also shares that church continues to be helpful for her.  Encouraged pt to continue with her self care activities and we will meet in 6 wks for a follow up session, due to my vacation.    Interventions: Cognitive Behavioral Therapy  Diagnosis:Adjustment disorder with mixed anxiety and depressed mood  Plan: Treatment Plan Strengths/Abilities:  Intelligent, Intuitive, Willing to participate in therapy Treatment Preferences:  Outpatient Individual Therapy Statement of Needs:  Patient is to use CBT, mindfulness and coping skills to help manage and/or decrease symptoms associated with their diagnosis. Symptoms:  Depressed/Irritable mood, worry, social withdrawal Problems Addressed:  Depressive thoughts, Sadness, Sleep issues, etc. Long Term Goals:  Pt to reduce overall level, frequency, and intensity of the feelings of depression/anxiety as evidenced by decreased irritability, negative self talk, and helpless feelings from 6 to 7 days/week to 0 to 1 days/week, per client report, for at least 3 consecutive months.  Progress: 30% Short Term Goals:  Pt  to verbally express understanding of the relationship between feelings of depression/anxiety and their impact on thinking patterns and behaviors.  Pt to verbalize an understanding of the role that distorted thinking plays in creating fears, excessive worry, and ruminations.  Progress: 30% Target Date:  04/22/2024 Frequency:  Bi-weekly Modality:  Cognitive Behavioral Therapy Interventions by Therapist:  Therapist will use CBT, Mindfulness exercises, Coping skills and Referrals, as needed by client. Client has verbally approved this  treatment plan.  Karie Kirks, [facility]

## 2024-02-29 ENCOUNTER — Other Ambulatory Visit: Payer: Self-pay | Admitting: Family Medicine

## 2024-03-29 ENCOUNTER — Other Ambulatory Visit: Payer: Self-pay | Admitting: Family Medicine

## 2024-03-29 DIAGNOSIS — F411 Generalized anxiety disorder: Secondary | ICD-10-CM

## 2024-03-29 NOTE — Telephone Encounter (Signed)
 Requested Prescriptions   Pending Prescriptions Disp Refills   ALPRAZolam  (XANAX ) 0.5 MG tablet [Pharmacy Med Name: ALPRAZOLAM  0.5MG  TABLETS] 30 tablet     Sig: TAKE 1 TABLET BY MOUTH DAILY AS NEEDED FOR ANXIETY     Date of patient request: 03/29/2024 Last office visit: 01/06/2024 Upcoming visit: 04/04/2024 Date of last refill: 11/04/2023 Last refill amount: 30

## 2024-03-29 NOTE — Telephone Encounter (Signed)
 Controlled substance database reviewed.  Last filled alprazolam  No. 30 on 11/04/2023, previously 04/22/2023, medication discussed at February 5 visit.  Refill ordered

## 2024-04-01 LAB — HM MAMMOGRAPHY

## 2024-04-04 ENCOUNTER — Ambulatory Visit: Payer: 59 | Admitting: Family Medicine

## 2024-04-04 ENCOUNTER — Encounter: Payer: Self-pay | Admitting: Obstetrics & Gynecology

## 2024-04-04 ENCOUNTER — Encounter: Payer: Self-pay | Admitting: Family Medicine

## 2024-04-04 VITALS — BP 138/70 | HR 96 | Temp 98.0°F | Ht 67.0 in | Wt 190.6 lb

## 2024-04-04 DIAGNOSIS — E785 Hyperlipidemia, unspecified: Secondary | ICD-10-CM | POA: Diagnosis not present

## 2024-04-04 DIAGNOSIS — I10 Essential (primary) hypertension: Secondary | ICD-10-CM

## 2024-04-04 DIAGNOSIS — Z23 Encounter for immunization: Secondary | ICD-10-CM

## 2024-04-04 DIAGNOSIS — F411 Generalized anxiety disorder: Secondary | ICD-10-CM | POA: Diagnosis not present

## 2024-04-04 DIAGNOSIS — E119 Type 2 diabetes mellitus without complications: Secondary | ICD-10-CM

## 2024-04-04 DIAGNOSIS — Z7985 Long-term (current) use of injectable non-insulin antidiabetic drugs: Secondary | ICD-10-CM

## 2024-04-04 MED ORDER — TIRZEPATIDE 7.5 MG/0.5ML ~~LOC~~ SOAJ: 7.5000 mg | SUBCUTANEOUS | 1 refills | Status: DC
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
## 2024-04-04 NOTE — Patient Instructions (Addendum)
 Thanks for coming in today.  Sorry to hear about the stressors right now but I think discussing plan with your therapist tomorrow will be helpful.  [phone] hotline is available if needed at any time.  No change in meds for now.  Please let me know if I can help further.  After your current injections are finished, I think it would be reasonable to increase Mounjaro to 7.5 mg for further improved diabetes control, goal of under 6.5 would be reasonable.  That should also help if you are having a plateau with weight loss.    Hang in there!

## 2024-04-05 ENCOUNTER — Encounter: Payer: Self-pay | Admitting: Family Medicine

## 2024-04-05 ENCOUNTER — Ambulatory Visit (INDEPENDENT_AMBULATORY_CARE_PROVIDER_SITE_OTHER): Admitting: Psychology

## 2024-04-05 DIAGNOSIS — F4323 Adjustment disorder with mixed anxiety and depressed mood: Secondary | ICD-10-CM | POA: Diagnosis not present

## 2024-04-05 LAB — COMPREHENSIVE METABOLIC PANEL WITH GFR
ALT: 15 U/L (ref 0–35)
AST: 22 U/L (ref 0–37)
Albumin: 4.6 g/dL (ref 3.5–5.2)
Alkaline Phosphatase: 55 U/L (ref 39–117)
BUN: 9 mg/dL (ref 6–23)
CO2: 26 meq/L (ref 19–32)
Calcium: 9.6 mg/dL (ref 8.4–10.5)
Chloride: 101 meq/L (ref 96–112)
Creatinine, Ser: 0.56 mg/dL (ref 0.40–1.20)
GFR: 106.25 mL/min (ref >60.00)
Glucose, Bld: 181 mg/dL — ABNORMAL HIGH (ref 70–99)
Potassium: 3.6 meq/L (ref 3.5–5.1)
Sodium: 136 meq/L (ref 135–145)
Total Bilirubin: 0.5 mg/dL (ref 0.2–1.2)
Total Protein: 7.5 g/dL (ref 6.0–8.3)

## 2024-04-05 LAB — HEMOGLOBIN A1C: Hgb A1c MFr Bld: 6.6 % — ABNORMAL HIGH (ref 4.6–6.5)

## 2024-04-05 NOTE — Progress Notes (Addendum)
 [facility] Counselor/Therapist Progress Note  Patient ID: Stacy Hull, MRN: [id number],    Date: 04/05/2024  Time Spent: 60 mins  start time: 1300     end time: 1400  Treatment Type: Individual Therapy  Reported Symptoms: Pt presents for session via Caregility video, granting consent for the session.  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  I shared with pt that I am in my office with no one else here either.  Many notes for this pt are still in Therapy Charts.  Mental Status Exam: Appearance:  Casual     Behavior: Appropriate  Motor: Normal  Speech/Language:  Clear and Coherent  Affect: Appropriate  Mood: normal  Thought process: normal  Thought content:   WNL  Sensory/Perceptual disturbances:   WNL  Orientation: oriented to person, place, and time/date  Attention: Good  Concentration: Good  Memory: WNL  Fund of knowledge:  Good  Insight:   Good  Judgment:  Good  Impulse Control: Good   Risk Assessment: Danger to Self:  No Self-injurious Behavior: No Danger to Others: No Duty to Warn:no Physical Aggression / Violence:No  Access to Firearms a concern: No  Gang Involvement:No   Subjective: Pt shares "I have been OK since our last session.  I am struggling with Stacy Hull at this point and her school avoidance.  She is missing lots of school and she is failing all of her classes.  I have tried everything and I can't make school important to her."  Pt shares that Stacy Hull went to the Urgent Care on Sunday because of headaches; they referred him to the ED and we were there for 8 hrs but he is OK.  Pt also expresses frustration with Stacy Hull on several things that he has done lately with her and the family.  She has asked him to leave and he has refused.  He is still not taking any responsibility with his mom (Stacy Hull ) and his mom's friends are calling them to express their concerns.  "He is just making my life terrible."  Pt shares that Stacy Hull even  refuses to empty his phone's voicemail box so that he can avoid new messages.  Pt gives multiple examples of how Stacy Hull is just not contributing to the family in any meaningful way.  Pt shares that she saw her PCP this week for a regular check up.  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  He does not work the same days every week.  Sometimes he realizes he needs to do better and will try to pick things up more and then he will let it all go again.  Pt shares that Stacy Hull is now dating another guy and he was telling his dad that his partner was a girl because he did not want to deal with his dad's reaction.  Pt shares that she and Stacy Hull no longer sleep in the same bed.  Pt shares that she sent Stacy Hull an email on 4/7 challenging him to interact more/better with her and the kids; she shares the email with me.  Encouraged pt to print the email and sit down with Stacy Hull and push the conversation to resolution that Stacy Hull either contributes more to her and the family or pt may choose to move toward divorce.  Pt is going to a conference in [place] in early June and she is looking forward to that.  Pt continues to take her Buspirone   and Cymbalta daily.  Pt has picked back up swimming again and she knows that is so good for her.  She is still taking her Mounjaro weekly  Encouraged pt to continue with her self care activities and we will meet in 2 wks for a follow up session.    Interventions: Cognitive Behavioral Therapy  Diagnosis:Adjustment disorder with mixed anxiety and depressed mood  Plan: Treatment Plan Strengths/Abilities:  Intelligent, Intuitive, Willing to participate in therapy Treatment Preferences:  Outpatient Individual Therapy Statement of Needs:  Patient is to use CBT, mindfulness and coping skills to help manage and/or decrease symptoms associated with their diagnosis. Symptoms:  Depressed/Irritable mood, worry, social withdrawal Problems Addressed:  Depressive thoughts,  Sadness, Sleep issues, etc. Long Term Goals:  Pt to reduce overall level, frequency, and intensity of the feelings of depression/anxiety as evidenced by decreased irritability, negative self talk, and helpless feelings from 6 to 7 days/week to 0 to 1 days/week, per client report, for at least 3 consecutive months.  Progress: 30% Short Term Goals:  Pt to verbally express understanding of the relationship between feelings of depression/anxiety and their impact on thinking patterns and behaviors.  Pt to verbalize an understanding of the role that distorted thinking plays in creating fears, excessive worry, and ruminations.  Progress: 30% Target Date:  04/22/2025 Frequency:  Bi-weekly Modality:  Cognitive Behavioral Therapy Interventions by Therapist:  Therapist will use CBT, Mindfulness exercises, Coping skills and Referrals, as needed by client. Client has verbally approved this treatment plan.  Jhonny Moss, [facility]

## 2024-04-11 ENCOUNTER — Telehealth: Payer: Self-pay

## 2024-04-11 NOTE — Telephone Encounter (Signed)
 [name] relayed results to patient, [name] assist with this call. Thanks

## 2024-04-11 NOTE — Telephone Encounter (Signed)
-----   Message from Benjiman Bras sent at 04/10/2024  3:04 PM EDT ----- Results sent by MyChart, but appears patient has not yet reviewed those results.  Please call and make sure they have either seen note or discuss result note.  Thanks.

## 2024-04-19 ENCOUNTER — Ambulatory Visit (INDEPENDENT_AMBULATORY_CARE_PROVIDER_SITE_OTHER): Admitting: Psychology

## 2024-04-19 DIAGNOSIS — F4323 Adjustment disorder with mixed anxiety and depressed mood: Secondary | ICD-10-CM | POA: Diagnosis not present

## 2024-04-19 NOTE — Progress Notes (Signed)
  Behavioral Health Counselor/Therapist Progress Note  Patient ID: Stacy Hull, MRN: [id number],    Date: 04/05/2024  Time Spent: 60 mins  start time: 1300     end time: 1400  Treatment Type: Individual Therapy  Reported Symptoms: Pt presents for session via Caregility video, granting consent for the session.  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  I shared with pt that I am in my office with no one else here either.  Many notes for this pt are still in Therapy Charts.  Mental Status Exam: Appearance:  Casual     Behavior: Appropriate  Motor: Normal  Speech/Language:  Clear and Coherent  Affect: Appropriate  Mood: normal  Thought process: normal  Thought content:   WNL  Sensory/Perceptual disturbances:   WNL  Orientation: oriented to person, place, and time/date  Attention: Good  Concentration: Good  Memory: WNL  Fund of knowledge:  Good  Insight:   Good  Judgment:  Good  Impulse Control: Good   Risk Assessment: Danger to Self:  No Self-injurious Behavior: No Danger to Others: No Duty to Warn:no Physical Aggression / Violence:No  Access to Firearms a concern: No  Gang Involvement:No   Subjective: Pt shares "I have been good since our last session; I am feeling more grounded and settled.  I felt so good for the past week; I swam both days this past weekend and that was good for me.  Sylvester Evert has stepped up and is doing more stuff at home and that is helpful.  Carolin Chyle was Virjean Boman's birthday and his mom called him to tell him happy birthday and they talked for a good while.  I was glad to see that."  Pt shares that she found a Netflix series ([place] period piece) that she enjoyed and that was fun for her.  Pt shares that Sylvester Evert has been stepping up as a result of the email that she sent him about 3 wks ago.  Pt shares that Meredith Stalls is still struggling with school, both with her grades and her attendance.  Talked with pt about how hard it is to allow  our kids to fail but they often learn difficult lessons when they do fail; failing earlier in life is generally less impactful for us ; the longer we live, the larger our failures impact us .  Pt shares her dad is doing OK; she talks with him regularly; she hopes to visit him this summer.  Pt shares that she is very happy with the new Neldon Baltimore and is surprised that he is from [place].  Pt continues to take her Buspirone  and Cymbalta daily.  Pt has picked back up swimming again and she knows that is so good for her.  She is currently reading three different books at the same time.  She is still taking her Mounjaro weekly and she recognizes the benefit of it for her; she has lost 18 pounds so far; her A1C is now 6.6, down from over 8. Encouraged pt to continue with her self care activities and we will meet in 3 wks for a follow up session.    Interventions: Cognitive Behavioral Therapy  Diagnosis:Adjustment disorder with mixed anxiety and depressed mood  Plan: Treatment Plan Strengths/Abilities:  Intelligent, Intuitive, Willing to participate in therapy Treatment Preferences:  Outpatient Individual Therapy Statement of Needs:  Patient is to use CBT, mindfulness and coping skills to help manage and/or decrease symptoms associated with their diagnosis. Symptoms:  Depressed/Irritable mood, worry, social  withdrawal Problems Addressed:  Depressive thoughts, Sadness, Sleep issues, etc. Long Term Goals:  Pt to reduce overall level, frequency, and intensity of the feelings of depression/anxiety as evidenced by decreased irritability, negative self talk, and helpless feelings from 6 to 7 days/week to 0 to 1 days/week, per client report, for at least 3 consecutive months.  Progress: 30% Short Term Goals:  Pt to verbally express understanding of the relationship between feelings of depression/anxiety and their impact on thinking patterns and behaviors.  Pt to verbalize an understanding of the role that distorted thinking  plays in creating fears, excessive worry, and ruminations.  Progress: 30% Target Date:  04/22/2025 Frequency:  Bi-weekly Modality:  Cognitive Behavioral Therapy Interventions by Therapist:  Therapist will use CBT, Mindfulness exercises, Coping skills and Referrals, as needed by client. Client has verbally approved this treatment plan.  Jhonny Moss, [facility]

## 2024-04-21 ENCOUNTER — Telehealth: Admitting: Family Medicine

## 2024-04-21 DIAGNOSIS — K642 Third degree hemorrhoids: Secondary | ICD-10-CM | POA: Diagnosis not present

## 2024-04-21 MED ORDER — HYDROCORTISONE (PERIANAL) 2.5 % EX CREA: 1.0000 | TOPICAL_CREAM | Freq: Two times a day (BID) | CUTANEOUS | 0 refills | Status: AC

## 2024-04-21 NOTE — Patient Instructions (Addendum)
 Sorry to hear that the hemorrhoids are causing a problem again.  I did send in some Anusol  HC cream if that is covered, or can continue the other over-the-counter treatments until you see general surgery.  I did place a referral to see Dr. Hershell Lose.  Let me know if you do not hear from them in the next week or so.  If any worsening symptoms be seen.  Hang in there.    Hemorrhoids Hemorrhoids are swollen veins in and around the rectum or the opening of the butt (anus). There are two types of hemorrhoids: Internal. These occur in the veins just inside the rectum. They may poke through to the outside and become irritated and painful. External. These occur in the veins outside the anus. They can be felt as a painful swelling or hard lump near the anus. Most hemorrhoids do not cause severe problems. Often, they can be treated at home with diet and lifestyle changes. If home treatments do not help, you may need a procedure to shrink or remove the hemorrhoids. What are the causes? Hemorrhoids are caused by pressure near the anus. This pressure may be caused by: Constipation or diarrhea. Straining to poop. Pregnancy. Obesity. Sitting or riding a bike for a long time. Heavy lifting or other things that cause you to strain. Anal sex. What are the signs or symptoms? Symptoms of this condition include: Pain. Anal itching or irritation. Bleeding from the rectum. Leakage of poop (stool). Swelling of the anus. One or more lumps around the anus. How is this diagnosed? Hemorrhoids can often be diagnosed through a visual exam. Other exams or tests may also be done, such as: A digital rectal exam. This is when your health care provider feels inside your rectum with a gloved finger. Anoscope. This is an exam of the anus using a small tube. A blood test, if you have lost a lot of blood. A sigmoidoscopy or colonoscopy. These are tests to look inside the colon using a tube with a camera on the end. How is this  treated? In most cases, hemorrhoids can be treated at home with diet and lifestyle changes. If these changes do not help, you may need to have a procedure done. These procedures can make the hemorrhoids smaller or fully remove them. Common procedures include: Rubber band ligation. Rubber bands are placed at the base of the hemorrhoids to cut off their blood supply. Sclerotherapy. Medicine is put into the hemorrhoids to shrink them. Infrared coagulation. A type of light energy is used to get rid of the hemorrhoids. Hemorrhoidectomy surgery. The hemorrhoids are removed during surgery. Then, the veins that supply them are tied off. Stapled hemorrhoidopexy surgery. The base of the hemorrhoid is stapled to the wall of the rectum. Follow these instructions at home: Medicines Take over-the-counter and prescription medicines only as told by your provider. Use medicated creams or medicines that are put in the rectum (suppositories) as told by your provider. Eating and drinking  Eat foods that are high in fiber, such as beans, whole grains, and fresh fruits and vegetables. Ask your provider about taking products that have fiber added to them (fiber supplements). Reduce the amount of fat in your diet. You can do this by eating low-fat dairy products, eating less red meat, and avoiding processed foods. Drink enough fluid to keep your pee (urine) pale yellow. Managing pain and swelling  Take warm sitz baths for 20 minutes, 3-4 times a day. This can help ease pain and discomfort.  You may do this in a bathtub or you can use a portable sitz bath that fits over the toilet. If told, put ice on the affected area. It may help to use ice packs between sitz baths. Put ice in a plastic bag. Place a towel between your skin and the bag. Leave the ice on for 20 minutes, 2-3 times a day. If your skin turns bright red, remove the ice right away to prevent skin damage. The risk of damage is higher if you cannot feel  pain, heat, or cold. General instructions Exercise. Ask your provider how much and what kind of exercise is best for you. In general, you should do moderate exercise for at least 30 minutes on most days of the week (150 minutes each week). You may want to try walking, biking, or yoga. Go to the bathroom when you have the urge to poop. Do not wait. Avoid straining to poop. Keep the anus dry and clean. Use wet toilet paper or moist towelettes after you poop. Do not sit on the toilet for a long time. This can increase blood pooling and pain. Where to find more information [employer] of Diabetes and Digestive and Kidney Diseases: [email or web address] Contact a health care provider if: You have more pain and swelling that do not get better with treatment. You have trouble pooping or you are not able to poop. You have pain or inflammation outside the area of the hemorrhoids. Get help right away if: You are bleeding from your rectum and you cannot get it to stop. This information is not intended to replace advice given to you by your health care provider. Make sure you discuss any questions you have with your health care provider. Document Revised: 07/30/2022 Document Reviewed: 07/30/2022 Elsevier Patient Education  2024 [employer].

## 2024-04-21 NOTE — Progress Notes (Signed)
 Virtual Visit via Video Note  I connected with Stacy Hull on 04/21/24 at 4:45 PM by a video enabled telemedicine application and verified that I am speaking with the correct person using two identifiers.  Patient location: home, with husband. Consent given to discuss PHI.  [place] location: office [place].    I discussed the limitations, risks, security and privacy concerns of performing an evaluation and management service by telephone and the availability of in person appointments. I also discussed with the patient that there may be a patient responsible charge related to this service. The patient expressed understanding and agreed to proceed, consent obtained  Chief complaint:  Chief Complaint  Patient presents with   Hemorrhoids    Hemorrhoids issue, need a referral to see Eddye Goodie MD.    History of Present Illness: Stacy Hull is a 53 y.o. female  Hemorrhoids: History of prolapsed internal hemorrhoids, noted few years ago. Prior issues with hemo Was treated by Dr. Hershell Lose with general surgery with hemorrhoidectomy in May 2022. Treated for   Now has had return of hemorrhoids - off and on past few weeks with some bleeding at times, enlarged external hemorrhoids.  Few weeks ago was sitting for prolonged period, irritation of rectal area. Called general surgery - needs referral.  Sore, irritated in rectal area. Hard to clean area, uncomfortable.   Diltiazem ointment, and tucks cream otc.    Patient Active Problem List   Diagnosis Date Noted   Rectal bleeding 04/26/2021   Rectal pain 04/26/2021   Ileitis, terminal (HCC) 04/26/2021   Serrated polyp of colon 04/26/2021   Acute proctitis 04/26/2021   Prolapsed internal hemorrhoids, grade 3 04/26/2021   Left corneal abrasion 01/09/2020   Controlled type 2 diabetes mellitus without complication, without long-term current use of insulin  (HCC) 10/13/2019   GAD (generalized anxiety disorder) 03/22/2019    Sinus congestion 11/18/2017   Acute non-recurrent maxillary sinusitis 11/18/2017   S/P hysterectomy 11/21/2016   History of abdominal hernia 10/07/2016   Right upper quadrant abdominal pain 10/07/2016   Vitamin D  deficiency 05/12/2016   Anxiety state 09/27/2007   Hyperlipidemia 03/18/2007   Depression 03/18/2007   Essential hypertension 03/18/2007   Past Medical History:  Diagnosis Date   Allergic rhinitis    Anxiety    Asthma    MILD    Depression    DM type 2 (diabetes mellitus, type 2) (HCC)    DIET CONTROLLED   GERD (gastroesophageal reflux disease)    takes TUMS   HTN (hypertension)    Hyperlipidemia    Hyperparathyroidism (HCC)    Pneumonia YRS AGO   Wears glasses    Past Surgical History:  Procedure Laterality Date   ABDOMINAL HYSTERECTOMY N/A 2017   CERVICAL CONE BIOPSY  YRS AGO   CESAREAN SECTION  2005   CESAREAN SECTION N/A    Phreesia 01/18/2021   EVALUATION UNDER ANESTHESIA WITH HEMORRHOIDECTOMY N/A 04/26/2021   Procedure: EXAM UNDER ANESTHESIA  WITH HEMORRHOIDECTOMY; HEMORRHOIDPEXY AND LIGATION;  Surgeon: Candyce Champagne, MD;  Location: [facility];  Service: General;  Laterality: N/A;   HERNIA REPAIR N/A    HYSTEROSCOPY WITH NOVASURE  11/26/2012   Procedure: HYSTEROSCOPY WITH NOVASURE;  Surgeon: Kandra Orn, MD;  Location: [facility];  Service: Gynecology;  Laterality: N/A;   LAPAROSCOPIC LYSIS OF ADHESIONS N/A 11/21/2016   Procedure: EXTENSIVE LAPAROSCOPIC LYSIS OF ADHESIONS;  Surgeon: Ivery Marking, MD;  Location: [facility];  Service: Gynecology;  Laterality: N/A;  LAPAROSCOPY N/A 11/21/2016   Procedure: LAPAROSCOPY DIAGNOSTIC, LAPAROSCOPIC REMOVAL OF FOREIGN BODY;  Surgeon: Shela Derby, MD;  Location: [facility];  Service: General;  Laterality: N/A;   PARATHYROIDECTOMY  1999   benign   REPAIR VAGINAL CUFF N/A 12/14/2016   Procedure: REPAIR VAGINAL CUFF;  Surgeon: Meriam Stamp, MD;  Location: [facility];  Service: Gynecology;   Laterality: N/A;   ROBOTIC ASSISTED LAPAROSCOPIC OVARIAN CYSTECTOMY Right 10/01/2015   Procedure: ROBOTIC ASSISTED LAPAROSCOPIC BILATERAL SALPINGECTOMY WITH LYSIS OF ADHESIONS AND RIGHT ADNEXAL CYST;  Surgeon: Marie-Lyne Lavoie, MD;  Location: [facility];  Service: Gynecology;  Laterality: Right;  POSSIBLE robot .  DO NOT DRAPE THE ROBOT.....she will use the laparoscopic camera to start.     ROBOTIC ASSISTED TOTAL HYSTERECTOMY WITH SALPINGECTOMY N/A 11/21/2016   Procedure: ROBOTIC ASSISTED TOTAL HYSTERECTOMY WITH  EXTENSIVE LYSIS OF ADHESIONS;  Surgeon: Ivery Marking, MD;  Location: [facility];  Service: Gynecology;  Laterality: N/A;   TUBAL LIGATION  YSR AGO   FALLOPIAN TUBES REMOIVED   UMBILICAL HERNIA REPAIR     WISDOM TOOTH EXTRACTION  AGE 88   Allergies  Allergen Reactions   Erythromycin  Other (See Comments)    Reaction:  GI upset    Prior to Admission medications   Medication Sig Start Date End Date Taking? Authorizing Provider  albuterol  (PROVENTIL ) (2.5 MG/3ML) 0.083% nebulizer solution Take 2.5 mg by nebulization. 12/05/22  Yes [provider]  albuterol  (VENTOLIN  HFA) 108 (90 Base) MCG/ACT inhaler Inhale 1-2 puffs into the lungs every 6 (six) hours as needed for wheezing or shortness of breath. 12/26/21  Yes Benjiman Bras, MD  ALPRAZolam  (XANAX ) 0.5 MG tablet TAKE 1 TABLET BY MOUTH DAILY AS NEEDED FOR ANXIETY 03/29/24  Yes Benjiman Bras, MD  atorvastatin  (LIPITOR) 20 MG tablet TAKE 1 TABLET(20 MG) BY MOUTH DAILY 01/06/24  Yes Benjiman Bras, MD  busPIRone  (BUSPAR ) 7.5 MG tablet TAKE 1 TABLET(7.5 MG) BY MOUTH TWICE DAILY 02/08/24  Yes Benjiman Bras, MD  citalopram  (CELEXA ) 40 MG tablet TAKE 1 TABLET(40 MG) BY MOUTH DAILY 01/14/24  Yes Benjiman Bras, MD  dicyclomine (BENTYL) 20 MG tablet  12/19/21  Yes [provider]  estradiol  (VIVELLE -DOT) 0.05 MG/24HR patch Place 1 patch onto the skin 2 (two) times a week. 02/29/24  Yes [provider]   hydrocortisone -pramoxine (ANALPRAM HC) 2.5-1 % rectal cream Place 1 application rectally 3 (three) times daily. 10/19/20  Yes Arloa Lamas, MD  ibuprofen  (ADVIL ) 800 MG tablet Take 0.5-1 tablets (400-800 mg total) by mouth every 8 (eight) hours as needed for moderate pain (pain score 4-6) (Take with food.). Take with food 11/04/23  Yes Benjiman Bras, MD  lisinopril  (ZESTRIL ) 10 MG tablet Take 1 tablet (10 mg total) by mouth daily. TAKE 1 TABLET(10 MG) BY MOUTH DAILY in addition to 20/25mg  combination with hct. 01/06/24  Yes Benjiman Bras, MD  lisinopril -hydrochlorothiazide  (ZESTORETIC ) 20-25 MG tablet Take 1 tablet by mouth daily. 01/06/24  Yes Benjiman Bras, MD  Multiple Vitamin (MULTIVITAMIN WITH MINERALS) TABS tablet Take 1 tablet by mouth daily.   Yes [provider]  tirzepatide Florence Hunt) 7.5 MG/0.5ML Pen Inject 7.5 mg into the skin once a week. 04/04/24  Yes Benjiman Bras, MD  traZODone  (DESYREL ) 50 MG tablet TAKE 1 TO 2 TABLETS(50 TO 100 MG) BY MOUTH AT BEDTIME 02/08/24  Yes Benjiman Bras, MD  triamcinolone  (NASACORT ) 55 MCG/ACT AERO nasal inhaler Place 2 sprays into the nose daily. 01/26/20  Yes Elyce Hams, Marguerita Shih, MD  Vitamin D , Ergocalciferol , (DRISDOL ) 1.25 MG (50000 UNIT) CAPS capsule Take 1 capsule (50,000 Units total) by mouth every 7 (seven) days. 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 Walks 3x's weekly for exercise.   Social Drivers of Corporate investment banker Strain: Not on file  Food Insecurity: Not on file  Transportation Needs: Not on file  Physical Activity: Not on file  Stress: Not on file  Social Connections: Not on file  Intimate Partner Violence: Not on file    Observations/Objective: Speak in normal sentences, no respiratory distress, euthymic mood.  All questions answered with understanding of plan expressed.  Assessment and Plan: Prolapsed internal hemorrhoids, grade 3 - Plan: Ambulatory referral to General Surgery, hydrocortisone  (ANUSOL -HC) 2.5 % rectal cream Recurrent issues with hemorrhoids, has seen general surgery previously with prior treatment.  Anusol  HC ordered if needed temporarily for symptomatic control, and referral to general surgery ordered.  RTC/ER precautions.  Follow Up Instructions: As needed.   I discussed the assessment and treatment plan with the patient. The patient was provided an opportunity to ask questions and all were answered. The patient agreed with the plan and demonstrated an understanding of the instructions.   The patient was advised to call back or seek an in-person evaluation if the symptoms worsen or if the condition fails to improve as anticipated.   Benjiman Bras, MD

## 2024-04-23 ENCOUNTER — Encounter: Payer: Self-pay | Admitting: Family Medicine

## 2024-04-29 NOTE — Addendum Note (Signed)
 Addended by: Caro Christmas R on: 04/29/2024 08:14 PM   Modules accepted: Level of Service

## 2024-05-13 ENCOUNTER — Ambulatory Visit (INDEPENDENT_AMBULATORY_CARE_PROVIDER_SITE_OTHER): Admitting: Psychology

## 2024-05-13 DIAGNOSIS — F4323 Adjustment disorder with mixed anxiety and depressed mood: Secondary | ICD-10-CM | POA: Diagnosis not present

## 2024-05-13 NOTE — Progress Notes (Signed)
 [facility] Counselor/Therapist Progress Note  Patient ID: Stacy Hull, MRN: [id number],    Date: 04/05/2024  Time Spent: 60 mins  start time: 1000     end time: 1100  Treatment Type: Individual Therapy  Reported Symptoms: Pt presents for session via Caregility video, granting consent for the session.  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  I shared with pt that I am in my office with no one else here either.  Many notes for this pt are still in Therapy Charts.  Mental Status Exam: Appearance:  Casual     Behavior: Appropriate  Motor: Normal  Speech/Language:  Clear and Coherent  Affect: Appropriate  Mood: normal  Thought process: normal  Thought content:   WNL  Sensory/Perceptual disturbances:   WNL  Orientation: oriented to person, place, and time/date  Attention: Good  Concentration: Good  Memory: WNL  Fund of knowledge:  Good  Insight:   Good  Judgment:  Good  Impulse Control: Good   Risk Assessment: Danger to Self:  No Self-injurious Behavior: No Danger to Others: No Duty to Warn:no Physical Aggression / Violence:No  Access to Firearms a concern: No  Gang Involvement:No   Subjective: Pt shares I have been good since our last session; 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  Pt is planning to stay for two more years and retire and then find something that is fewer hours and less stressful.  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  Meredith Stalls passed all of her classes and that was a relief for pt and for [facility].  Gena Kemp still has not gotten a job and is still involved with his friend, Juleen Oakland.  Gena Kemp is planning to sell plasma today to gain some money.  Pt continues to encourage Gena Kemp to get a job so he has some extra money.  Pt is looking forward to the summer and take care of herself and doing fun things  with her kids and Sylvester Evert.  Pt shares they had graduation yesterday and then they had their end of the year teachers' luncheon as well.  [facility]; she will continue to be working out during the summer.  She has continued with her Monjauro and has lost 15 pounds so far.  Encouraged pt to spend her summer taking care of herself and nurturing herself.  Pt continues to watch [employer] news in the evening to get her national and international news updates and limits her exposure that way.  She is also reading a lot as well.  She is watching favorite TV shows and movies as well.  She has talked with her dad recently and he is doing OK.  She would like to have him come down to stay for a few days this summer.  Pt continues to take her Buspirone  and Cymbalta daily.  Encouraged pt to continue with her self care activities and we will meet in 4 wks for a follow up session.    Interventions: Cognitive Behavioral Therapy  Diagnosis:Adjustment disorder with mixed anxiety and depressed mood  Plan: Treatment Plan Strengths/Abilities:  Intelligent, Intuitive, Willing to participate in therapy Treatment Preferences:  Outpatient Individual Therapy Statement of Needs:  Patient is to use CBT, mindfulness and coping skills to help manage and/or decrease  symptoms associated with their diagnosis. Symptoms:  Depressed/Irritable mood, worry, social withdrawal Problems Addressed:  Depressive thoughts, Sadness, Sleep issues, etc. Long Term Goals:  Pt to reduce overall level, frequency, and intensity of the feelings of depression/anxiety as evidenced by decreased irritability, negative self talk, and helpless feelings from 6 to 7 days/week to 0 to 1 days/week, per client report, for at least 3 consecutive months.  Progress: 30% Short Term Goals:  Pt to verbally express understanding of the relationship between feelings of depression/anxiety and their impact on thinking patterns and behaviors.  Pt to verbalize an  understanding of the role that distorted thinking plays in creating fears, excessive worry, and ruminations.  Progress: 30% Target Date:  04/22/2025 Frequency:  Bi-weekly Modality:  Cognitive Behavioral Therapy Interventions by Therapist:  Therapist will use CBT, Mindfulness exercises, Coping skills and Referrals, as needed by client. Client has verbally approved this treatment plan.  Jhonny Moss, [facility]

## 2024-05-26 ENCOUNTER — Other Ambulatory Visit: Payer: Self-pay | Admitting: Family Medicine

## 2024-05-26 DIAGNOSIS — E7800 Pure hypercholesterolemia, unspecified: Secondary | ICD-10-CM

## 2024-05-26 DIAGNOSIS — I10 Essential (primary) hypertension: Secondary | ICD-10-CM

## 2024-06-08 ENCOUNTER — Ambulatory Visit (INDEPENDENT_AMBULATORY_CARE_PROVIDER_SITE_OTHER): Admitting: Psychology

## 2024-06-08 DIAGNOSIS — F331 Major depressive disorder, recurrent, moderate: Secondary | ICD-10-CM | POA: Diagnosis not present
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
## 2024-06-23 ENCOUNTER — Ambulatory Visit: Admitting: Psychology

## 2024-06-23 DIAGNOSIS — F331 Major depressive disorder, recurrent, moderate: Secondary | ICD-10-CM
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
## 2024-06-28 ENCOUNTER — Other Ambulatory Visit: Payer: Self-pay | Admitting: Family Medicine

## 2024-06-28 DIAGNOSIS — E1165 Type 2 diabetes mellitus with hyperglycemia: Secondary | ICD-10-CM

## 2024-07-13 ENCOUNTER — Ambulatory Visit: Admitting: Psychology

## 2024-07-13 DIAGNOSIS — F331 Major depressive disorder, recurrent, moderate: Secondary | ICD-10-CM | POA: Diagnosis not present

## 2024-07-13 NOTE — Progress Notes (Addendum)
 [facility] Counselor/Therapist Progress Note  Patient ID: Stacy Hull, MRN: [id number],    Date: 07/13/2024  Time Spent: 60 mins  start time: 0900     end time: 1000  Treatment Type: Individual Therapy  Reported Symptoms: Pt presents for session via Caregility video, granting consent for the session.  Pt states she is in her home with no one else present.  I shared with pt that I am in my office with no one else here either.  Many notes for this pt are still in Therapy Charts.  Mental Status Exam: Appearance:  Casual     Behavior: Appropriate  Motor: Normal  Speech/Language:  Clear and Coherent  Affect: Appropriate  Mood: normal  Thought process: normal  Thought content:   WNL  Sensory/Perceptual disturbances:   WNL  Orientation: oriented to person, place, and time/date  Attention: Good  Concentration: Good  Memory: WNL  Fund of knowledge:  Good  Insight:   Good  Judgment:  Good  Impulse Control: Good   Risk Assessment: Danger to Self:  No Self-injurious Behavior: No Danger to Others: No Duty to Warn:no Physical Aggression / Violence:No  Access to Firearms a concern: No  Gang Involvement:No   Subjective: Pt shares [name] just got back from [place] yesterday visiting his mom.  She cannot drive anymore so he had to take care of the car.  He is working to try to sell the home and get her moved to [place].  He was not able to finish everything even though he was there was there for 2 wks.  Pt shares that her father's birthday was 8/7 and she was sick and could not make the trip.  Pt shares that her dad should be able to leave the rehab soon and return home to his house with her brother Kirt), sister-in-law Gery), and their kids.  Pt shares that [name] is [employer] and is going to start delivering pizzas on Thursday; he is going into his last semester at [employer] for his associates degree.  Pt is allowing [name] to start getting her school clothes and supplies for  the upcoming school year.  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  Chava Dulac's mom can no longer manage her finances and he got himself added to her accounts at the bank.  Pt shares her dad has been cleared by the orthopedic surgeon to come home and the oncologist is waiting until he is able to come home and do treatment on an outpatient basis.  Pt has one more week before she starts a week of teacher workdays and the kids start on 8/25.  Pt continues to talk with her dad (31 yo) daily and he is ready to return home.  [facility]; she enjoys the swimming and hot tub.  She continues to take her Monjauro and it is helping her.  She has also had a massage recently as well that was very enjoyable for her.  Pt has been watching favorite TV shows recently as well; she has not bee reading as much as a result.  She has been enjoying listening to music, cooking, and cleaning.  She has been talking with friends in [place] and in [place].  Pt may not be able to go see her dad before the start of school for her.  Pt continues to take her Buspirone  and Cymbalta daily.  Encouraged pt to continue with her self care  activities and we will meet in 2 wks for a follow up session.    Interventions: Cognitive Behavioral Therapy  Diagnosis:Major depressive disorder, recurrent episode, moderate (HCC)  Plan: Treatment Plan Strengths/Abilities:  Intelligent, Intuitive, Willing to participate in therapy Treatment Preferences:  Outpatient Individual Therapy Statement of Needs:  Patient is to use CBT, mindfulness and coping skills to help manage and/or decrease symptoms associated with their diagnosis. Symptoms:  Depressed/Irritable mood, worry, social withdrawal Problems Addressed:  Depressive thoughts, Sadness, Sleep issues, etc. Long Term Goals:  Pt to reduce overall level, frequency, and intensity of the feelings of depression/anxiety as evidenced by decreased irritability, negative self  talk, and helpless feelings from 6 to 7 days/week to 0 to 1 days/week, per client report, for at least 3 consecutive months.  Progress: 30% Short Term Goals:  Pt to verbally express understanding of the relationship between feelings of depression/anxiety and their impact on thinking patterns and behaviors.  Pt to verbalize an understanding of the role that distorted thinking plays in creating fears, excessive worry, and ruminations.  Progress: 30% Target Date:  04/22/2025 Frequency:  Bi-weekly Modality:  Cognitive Behavioral Therapy Interventions by Therapist:  Therapist will use CBT, Mindfulness exercises, Coping skills and Referrals, as needed by client. Client has verbally approved this treatment plan.  [name], [facility]

## 2024-07-14 ENCOUNTER — Encounter: Payer: Self-pay | Admitting: Family Medicine

## 2024-07-14 ENCOUNTER — Ambulatory Visit (INDEPENDENT_AMBULATORY_CARE_PROVIDER_SITE_OTHER): Admitting: Family Medicine

## 2024-07-14 VITALS — BP 112/80 | HR 95 | Wt 184.0 lb

## 2024-07-14 DIAGNOSIS — E559 Vitamin D deficiency, unspecified: Secondary | ICD-10-CM | POA: Diagnosis not present

## 2024-07-14 DIAGNOSIS — K642 Third degree hemorrhoids: Secondary | ICD-10-CM | POA: Diagnosis not present

## 2024-07-14 DIAGNOSIS — E119 Type 2 diabetes mellitus without complications: Secondary | ICD-10-CM

## 2024-07-14 DIAGNOSIS — I10 Essential (primary) hypertension: Secondary | ICD-10-CM | POA: Diagnosis not present

## 2024-07-14 DIAGNOSIS — F331 Major depressive disorder, recurrent, moderate: Secondary | ICD-10-CM

## 2024-07-14 DIAGNOSIS — F411 Generalized anxiety disorder: Secondary | ICD-10-CM

## 2024-07-14 DIAGNOSIS — E1165 Type 2 diabetes mellitus with hyperglycemia: Secondary | ICD-10-CM

## 2024-07-14 DIAGNOSIS — E785 Hyperlipidemia, unspecified: Secondary | ICD-10-CM | POA: Diagnosis not present

## 2024-07-14 DIAGNOSIS — Z7985 Long-term (current) use of injectable non-insulin antidiabetic drugs: Secondary | ICD-10-CM

## 2024-07-14 LAB — COMPREHENSIVE METABOLIC PANEL WITH GFR
ALT: 14 U/L (ref 0–35)
AST: 18 U/L (ref 0–37)
Albumin: 4.5 g/dL (ref 3.5–5.2)
Alkaline Phosphatase: 50 U/L (ref 39–117)
BUN: 10 mg/dL (ref 6–23)
CO2: 25 meq/L (ref 19–32)
Calcium: 9.6 mg/dL (ref 8.4–10.5)
Chloride: 100 meq/L (ref 96–112)
Creatinine, Ser: 0.51 mg/dL (ref 0.40–1.20)
GFR: 108.46 mL/min (ref >60.00)
Glucose, Bld: 106 mg/dL — ABNORMAL HIGH (ref 70–99)
Potassium: 3.6 meq/L (ref 3.5–5.1)
Sodium: 137 meq/L (ref 135–145)
Total Bilirubin: 0.6 mg/dL (ref 0.2–1.2)
Total Protein: 7.6 g/dL (ref 6.0–8.3)

## 2024-07-14 LAB — LIPID PANEL
Cholesterol: 199 mg/dL (ref 0–200)
HDL: 74.8 mg/dL (ref >39.00)
LDL Cholesterol: 71 mg/dL (ref 0–99)
NonHDL: 124.24
Total CHOL/HDL Ratio: 3
Triglycerides: 265 mg/dL — ABNORMAL HIGH (ref 0.0–149.0)
VLDL: 53 mg/dL — ABNORMAL HIGH (ref 0.0–40.0)

## 2024-07-14 LAB — HEMOGLOBIN A1C: Hgb A1c MFr Bld: 6.4 % (ref 4.6–6.5)

## 2024-07-14 LAB — MICROALBUMIN / CREATININE URINE RATIO
Creatinine,U: 45.9 mg/dL
Microalb Creat Ratio: UNDETERMINED mg/g (ref 0.0–30.0)
Microalb, Ur: <0.7 mg/dL

## 2024-07-14 LAB — VITAMIN D 25 HYDROXY (VIT D DEFICIENCY, FRACTURES): VITD: 27.49 ng/mL — ABNORMAL LOW (ref 30.00–100.00)

## 2024-07-14 MED ORDER — BUSPIRONE HCL 7.5 MG PO TABS: ORAL_TABLET | ORAL | 2 refills | Status: AC

## 2024-07-14 MED ORDER — LISINOPRIL 10 MG PO TABS: 10.0000 mg | ORAL_TABLET | Freq: Every day | ORAL | 2 refills | Status: AC

## 2024-07-14 MED ORDER — CITALOPRAM HYDROBROMIDE 40 MG PO TABS: ORAL_TABLET | ORAL | 2 refills | Status: AC

## 2024-07-14 MED ORDER — LISINOPRIL-HYDROCHLOROTHIAZIDE 20-25 MG PO TABS
1.0000 | ORAL_TABLET | Freq: Every day | ORAL | 1 refills | Status: AC
Start: 2024-07-14 — End: ?

## 2024-07-14 MED ORDER — TIRZEPATIDE 7.5 MG/0.5ML ~~LOC~~ SOAJ: 7.5000 mg | SUBCUTANEOUS | 1 refills | Status: DC

## 2024-07-14 NOTE — Progress Notes (Signed)
 Subjective:  Patient ID: Stacy Hull, female    DOB: 1971/08/22  Age: 53 y.o. MRN: [id number]  CC:  Chief Complaint  Patient presents with   Medical Management of Chronic Issues    3 month follow up for hyperlipidemia and prolapsed internal hemorrhoids     HPI Stacy Hull presents for   Diabetes: Complicated by hyperglycemia.  Intolerant to metformin due to GI upset along with chronic abdominal issues with IBD and ileitis.  Mounjaro has been tolerated along with some weight loss.  Discussed in May and had been swimming for exercise higher dose of Mounjaro ordered at that time.  Additionally had been started on estrogen by her gynecologist.  On ACE inhibitor and statin Denies new side effects with Mounjaro 7.5mg  weekly including no new nausea, vomiting, constipation, abdominal pain or neck swelling. Home readings - none, no symptoms of lows Microalbumin: Normal ratio prior - due Optho, foot exam, pneumovax:  Due for optho - plans to call for appt.   Lab Results  Component Value Date   HGBA1C 6.6 (H) 04/04/2024   HGBA1C 7.0 (H) 01/06/2024   HGBA1C 8.6 (H) 10/05/2023   Lab Results  Component Value Date   LDLCALC 59 01/06/2024   CREATININE 0.56 04/04/2024   Wt Readings from Last 3 Encounters:  07/14/24 184 lb (83.5 kg)  04/04/24 190 lb 9.6 oz (86.5 kg)  01/06/24 190 lb 9.6 oz (86.5 kg)   Anxiety Previously treated with Celexa, trazodone and buspirone, Xanax if needed for breakthrough symptoms.  Acute stressor discussed in May, With marital stress.  Has therapist.  Additional resources discussed May visit but no medication changes at that time. Father diagnosed with stage 4 metastatic prostate CA after fall - still in rehab, should be coming home soon. Lives 3 hours away.  Trying to get mother in law here from [place] .  Feels like managing stressors better, and current meds working well - only needed xanax 2-3x in past month. Meeting with therapist every 2-3  weeks.   Hyperlipidemia: Lipitor 20 mg daily.  Plan for repeat labs today.  No new myalgia/side effects Lab Results  Component Value Date   CHOL 175 01/06/2024   HDL 73.20 01/06/2024   LDLCALC 59 01/06/2024   LDLDIRECT 82.0 01/27/2023   TRIG 216.0 (H) 01/06/2024   CHOLHDL 2 01/06/2024   Lab Results  Component Value Date   ALT 15 04/04/2024   AST 22 04/04/2024   ALKPHOS 55 04/04/2024   BILITOT 0.5 04/04/2024   Vitamin D deficiency On over-the-counter supplement only. In [place]VI.  Last vitamin D Lab Results  Component Value Date   VD25OH 46.23 01/06/2024   Hypertension: Treated with lisinopril 30 mg total dosing with hydrochlorothiazide 25 mg daily. Home readings: none. No new med side effects. Increased side effects.  BP Readings from Last 3 Encounters:  07/14/24 112/80  04/04/24 138/70  01/06/24 128/70   Lab Results  Component Value Date   CREATININE 0.56 04/04/2024   Prolapsed internal hemorrhoids Discussed at video visit May 22.  Anusol HC refilled, referred to general surgery.  Deferred meeting with surgeon. Was just having a bad flare - doing better. More cautious with long drives.   HM: recommended flu. Covid boosters in fall  History Patient Active Problem List   Diagnosis Date Noted   Rectal bleeding 04/26/2021   Rectal pain 04/26/2021   Ileitis, terminal (HCC) 04/26/2021   Serrated polyp of colon 04/26/2021   Acute proctitis 04/26/2021   Prolapsed  internal hemorrhoids, grade 3 04/26/2021   Left corneal abrasion 01/09/2020   Controlled type 2 diabetes mellitus without complication, without long-term current use of insulin  (HCC) 10/13/2019   GAD (generalized anxiety disorder) 03/22/2019   Sinus congestion 11/18/2017   Acute non-recurrent maxillary sinusitis 11/18/2017   S/P hysterectomy 11/21/2016   History of abdominal hernia 10/07/2016   Right upper quadrant abdominal pain 10/07/2016   Vitamin D  deficiency 05/12/2016   Anxiety state 09/27/2007    Hyperlipidemia 03/18/2007   Depression 03/18/2007   Essential hypertension 03/18/2007   Past Medical History:  Diagnosis Date   Allergic rhinitis    Anxiety    Asthma    MILD    Depression    DM type 2 (diabetes mellitus, type 2) (HCC)    DIET CONTROLLED   GERD (gastroesophageal reflux disease)    takes TUMS   HTN (hypertension)    Hyperlipidemia    Hyperparathyroidism (HCC)    Pneumonia YRS AGO   Wears glasses    Past Surgical History:  Procedure Laterality Date   ABDOMINAL HYSTERECTOMY N/A 2017   CERVICAL CONE BIOPSY  YRS AGO   CESAREAN SECTION  2005   CESAREAN SECTION N/A    [employer] 01/18/2021   EVALUATION UNDER ANESTHESIA WITH HEMORRHOIDECTOMY N/A 04/26/2021   Procedure: EXAM UNDER ANESTHESIA  WITH HEMORRHOIDECTOMY; HEMORRHOIDPEXY AND LIGATION;  Surgeon: Sheldon Standing, MD;  Location: [facility];  Service: General;  Laterality: N/A;   HERNIA REPAIR N/A    HYSTEROSCOPY WITH NOVASURE  11/26/2012   Procedure: HYSTEROSCOPY WITH NOVASURE;  Surgeon: Dickie DELENA Carder, MD;  Location: [facility];  Service: Gynecology;  Laterality: N/A;   LAPAROSCOPIC LYSIS OF ADHESIONS N/A 11/21/2016   Procedure: EXTENSIVE LAPAROSCOPIC LYSIS OF ADHESIONS;  Surgeon: Dickie Carder, MD;  Location: [facility];  Service: Gynecology;  Laterality: N/A;   LAPAROSCOPY N/A 11/21/2016   Procedure: LAPAROSCOPY DIAGNOSTIC, LAPAROSCOPIC REMOVAL OF FOREIGN BODY;  Surgeon: Lynda Leos, MD;  Location: [facility];  Service: General;  Laterality: N/A;   PARATHYROIDECTOMY  1999   benign   REPAIR VAGINAL CUFF N/A 12/14/2016   Procedure: REPAIR VAGINAL CUFF;  Surgeon: Charlie Flowers, MD;  Location: [facility];  Service: Gynecology;  Laterality: N/A;   ROBOTIC ASSISTED LAPAROSCOPIC OVARIAN CYSTECTOMY Right 10/01/2015   Procedure: ROBOTIC ASSISTED LAPAROSCOPIC BILATERAL SALPINGECTOMY WITH LYSIS OF ADHESIONS AND RIGHT ADNEXAL CYST;  Surgeon: Marie-Lyne Lavoie, MD;  Location: [facility];  Service: Gynecology;   Laterality: Right;  POSSIBLE robot .  DO NOT DRAPE THE ROBOT.....she will use the laparoscopic camera to start.     ROBOTIC ASSISTED TOTAL HYSTERECTOMY WITH SALPINGECTOMY N/A 11/21/2016   Procedure: ROBOTIC ASSISTED TOTAL HYSTERECTOMY WITH  EXTENSIVE LYSIS OF ADHESIONS;  Surgeon: Dickie Carder, MD;  Location: [facility];  Service: Gynecology;  Laterality: N/A;   TUBAL LIGATION  YSR AGO   FALLOPIAN TUBES REMOIVED   UMBILICAL HERNIA REPAIR     WISDOM TOOTH EXTRACTION  AGE 31   Allergies  Allergen Reactions   Erythromycin  Other (See Comments)    Reaction:  GI upset    Prior to Admission medications   Medication Sig Start Date End Date Taking? Authorizing Provider  albuterol  (PROVENTIL ) (2.5 MG/3ML) 0.083% nebulizer solution Take 2.5 mg by nebulization. 12/05/22  Yes [provider]  albuterol  (VENTOLIN  HFA) 108 (90 Base) MCG/ACT inhaler Inhale 1-2 puffs into the lungs every 6 (six) hours as needed for wheezing or shortness of breath. 12/26/21  Yes Levora Reyes SAUNDERS, MD  ALPRAZolam  (XANAX ) 0.5 MG  tablet TAKE 1 TABLET BY MOUTH DAILY AS NEEDED FOR ANXIETY 03/29/24  Yes Levora Reyes SAUNDERS, MD  atorvastatin  (LIPITOR) 20 MG tablet TAKE 1 TABLET(20 MG) BY MOUTH DAILY 05/26/24  Yes Levora Reyes SAUNDERS, MD  busPIRone  (BUSPAR ) 7.5 MG tablet TAKE 1 TABLET(7.5 MG) BY MOUTH TWICE DAILY 02/08/24  Yes Levora Reyes SAUNDERS, MD  citalopram  (CELEXA ) 40 MG tablet TAKE 1 TABLET(40 MG) BY MOUTH DAILY 01/14/24  Yes Levora Reyes SAUNDERS, MD  dicyclomine (BENTYL) 20 MG tablet  12/19/21  Yes [provider]  estradiol  (VIVELLE -DOT) 0.05 MG/24HR patch Place 1 patch onto the skin 2 (two) times a week. 02/29/24  Yes [provider]  hydrocortisone  (ANUSOL -HC) 2.5 % rectal cream Place 1 Application rectally 2 (two) times daily. 04/21/24  Yes Levora Reyes SAUNDERS, MD  ibuprofen  (ADVIL ) 800 MG tablet Take 0.5-1 tablets (400-800 mg total) by mouth every 8 (eight) hours as needed for moderate pain (pain score 4-6) (Take  with food.). Take with food 11/04/23  Yes Levora Reyes SAUNDERS, MD  lisinopril  (ZESTRIL ) 10 MG tablet Take 1 tablet (10 mg total) by mouth daily. TAKE 1 TABLET(10 MG) BY MOUTH DAILY in addition to 20/25mg  combination with hct. 01/06/24  Yes Levora Reyes SAUNDERS, MD  lisinopril -hydrochlorothiazide  (ZESTORETIC ) 20-25 MG tablet TAKE 1 TABLET BY MOUTH DAILY 05/26/24  Yes Levora Reyes SAUNDERS, MD  Multiple Vitamin (MULTIVITAMIN WITH MINERALS) TABS tablet Take 1 tablet by mouth daily.   Yes [provider]  tirzepatide  (MOUNJARO ) 7.5 MG/0.5ML Pen Inject 7.5 mg into the skin once a week. 04/04/24  Yes Levora Reyes SAUNDERS, MD  traZODone  (DESYREL ) 50 MG tablet TAKE 1 TO 2 TABLETS(50 TO 100 MG) BY MOUTH AT BEDTIME 02/08/24  Yes Levora Reyes SAUNDERS, MD  triamcinolone  (NASACORT ) 55 MCG/ACT AERO nasal inhaler Place 2 sprays into the nose daily. 01/26/20  Yes Melonie Colonel, Mikel HERO, MD  Vitamin D , Ergocalciferol , (DRISDOL ) 1.25 MG (50000 UNIT) CAPS capsule Take 1 capsule (50,000 Units total) by mouth every 7 (seven) days. 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 Walks 3x's weekly for exercise.   Social Drivers of Manufacturing engineer Strain: Not on file  Food Insecurity: Not on file  Transportation Needs: Not on file  Physical Activity: Not on file  Stress: Not on file  Social Connections: Not on file  Intimate Partner Violence: Not on file    Review of Systems  Constitutional:  Negative for fatigue and unexpected weight change.  Respiratory:  Negative for chest tightness and shortness of breath.  Cardiovascular:  Negative for chest pain, palpitations and leg swelling.  Gastrointestinal:  Negative for abdominal pain and blood in stool.  Neurological:  Negative for dizziness, syncope, light-headedness and headaches.     Objective:   Vitals:   07/14/24 1015  BP: 112/80  Pulse: 95  SpO2: 97%  Weight: 184 lb (83.5 kg)     Physical Exam Vitals reviewed.  Constitutional:      Appearance: Normal appearance. She is well-developed.  HENT:     Head: Normocephalic and atraumatic.  Eyes:     Conjunctiva/sclera: Conjunctivae normal.     Pupils: Pupils are equal, round, and reactive to light.  Neck:     Vascular: No carotid bruit.  Cardiovascular:     Rate and Rhythm: Normal rate and regular rhythm.     Heart sounds: Normal heart sounds.  Pulmonary:     Effort: Pulmonary effort is normal.     Breath sounds: Normal breath sounds.  Abdominal:     Palpations: Abdomen is soft. There is no pulsatile mass.     Tenderness: There is no abdominal tenderness.  Musculoskeletal:     Right lower leg: No edema.     Left lower leg: No edema.  Skin:    General: Skin is warm and dry.  Neurological:     Mental Status: She is alert and oriented to person, place, and time.  Psychiatric:        Mood and Affect: Mood normal.        Behavior: Behavior normal.      Assessment & Plan:  Stacy Hull is a 53 y.o. female . Controlled type 2 diabetes mellitus without complication, without long-term current use of insulin  (HCC) - Plan: Microalbumin / creatinine urine ratio, Hemoglobin A1c  -   Stable, tolerating current regimen. Medications refilled. Labs pending as above.   Hyperlipidemia, unspecified hyperlipidemia type - Plan: Comprehensive metabolic panel with GFR, Lipid panel  - tolerating statin. Check labs, and adjust plan accordingly.   Prolapsed internal hemorrhoids, grade 3  - Improved after prior flare, decided against meeting with surgery.  Would consider still meeting with general surgery if persistent flares or worsening flares.  Can hold on that plan for now.  Essential hypertension - Plan: Comprehensive metabolic panel with GFR  - Stable control, continue same med regimen.  Check labs and adjust plan accordingly  GAD (generalized anxiety disorder)  - Stable at this time even with additional stressors as above.  No med changes, continue meeting with therapist, 35-month follow-up for physical.  Vitamin D  deficiency - Plan: Vitamin D  (25 hydroxy)  - Prior controlled on over-the-counter treatment with multivitamin, repeat labs and adjust plan accordingly.  Meds ordered this encounter  Medications   busPIRone  (BUSPAR ) 7.5 MG tablet    Sig: TAKE 1 TABLET(7.5 MG) BY MOUTH TWICE DAILY    Dispense:  60 tablet    Refill:  2   citalopram  (CELEXA ) 40 MG tablet    Sig: TAKE 1 TABLET(40 MG) BY MOUTH DAILY    Dispense:  90 tablet    Refill:  2   lisinopril  (ZESTRIL ) 10 MG tablet    Sig: Take 1 tablet (10 mg total) by mouth daily. TAKE 1 TABLET(10 MG) BY MOUTH DAILY in addition to 20/25mg  combination with hct.    Dispense:  90 tablet    Refill:  2   lisinopril -hydrochlorothiazide  (ZESTORETIC ) 20-25 MG tablet    Sig: Take 1 tablet by mouth daily.    Dispense:  90 tablet    Refill:  1   tirzepatide (MOUNJARO) 7.5 MG/0.5ML Pen    Sig: Inject 7.5 mg into the skin once a week.    Dispense:  6 mL    Refill:  1   Patient Instructions  Thanks for coming in today.  Sorry to hear about your father's developments.  Overall think you are doing well at this time and I do not  think any med changes are needed.  If any concerns on labs I will let you know.  Follow-up in 6 months but let me know if there are any questions or concerns in the interim.  Take care!     Signed,   Reyes Pines, MD [facility], [facility] 07/14/24 10:53 AM

## 2024-07-14 NOTE — Patient Instructions (Signed)
 Thanks for coming in today.  Sorry to hear about your father's developments.  Overall think you are doing well at this time and I do not think any med changes are needed.  If any concerns on labs I will let you know.  Follow-up in 6 months but let me know if there are any questions or concerns in the interim.  Take care!

## 2024-07-17 ENCOUNTER — Ambulatory Visit: Payer: Self-pay | Admitting: Family Medicine

## 2024-08-11 ENCOUNTER — Ambulatory Visit: Admitting: Psychology

## 2024-08-11 DIAGNOSIS — F4323 Adjustment disorder with mixed anxiety and depressed mood: Secondary | ICD-10-CM
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
## 2024-08-24 ENCOUNTER — Other Ambulatory Visit: Payer: Self-pay | Admitting: Family Medicine

## 2024-08-24 DIAGNOSIS — F411 Generalized anxiety disorder: Secondary | ICD-10-CM

## 2024-08-25 NOTE — Telephone Encounter (Signed)
 Medication discussed at her August 14 visit.  Controlled substance database reviewed.  #30 of alprazolam  last filled on 03/29/2024.  Refill ordered.

## 2024-08-26 ENCOUNTER — Ambulatory Visit: Admitting: Psychology

## 2024-08-26 DIAGNOSIS — F4323 Adjustment disorder with mixed anxiety and depressed mood: Secondary | ICD-10-CM | POA: Diagnosis not present

## 2024-08-26 NOTE — Progress Notes (Addendum)
 [facility] Counselor/Therapist Progress Note  Patient ID: Stacy Hull, MRN: [id number],    Date: 08/26/2024  Time Spent: 50 mins  start time: 1300     end time: 1350  Treatment Type: Individual Therapy  Reported Symptoms: Pt presents for session via Caregility video, granting consent for the session.  Pt states she is in her home with no one else present.  I shared with pt that I am in my office with no one else here either.  Many notes for this pt are still in Therapy Charts.  Mental Status Exam: Appearance:  Casual     Behavior: Appropriate  Motor: Normal  Speech/Language:  Clear and Coherent  Affect: Appropriate  Mood: normal  Thought process: normal  Thought content:   WNL  Sensory/Perceptual disturbances:   WNL  Orientation: oriented to person, place, and time/date  Attention: Good  Concentration: Good  Memory: WNL  Fund of knowledge:  Good  Insight:   Good  Judgment:  Good  Impulse Control: Good   Risk Assessment: Danger to Self:  No Self-injurious Behavior: No Danger to Others: No Duty to Warn:no Physical Aggression / Violence:No  Access to Firearms a concern: No  Gang Involvement:No   Subjective: Pt shares My dad (94 yo) died last 10/17/2024 night (12:23 am Saturday); I was able to be there with him when he passed; Hospice was there and the family was gathered with him.  Pt was glad to be with him during those last days.  The memorial service for her dad will be this coming Monday.  Pt had an interaction with her boss at work yesterday; she did not feel supported by her principal.  Pt was looking at her email while she was gone to [place] and she felt pressured to respond to emails from the principal although he was sending them to all teachers.  Pt knows he was OK with her not responding to the emails and he confirmed that with her yesterday.  She feels better about the situation now.  Pt is grieving the loss of her dad; she was closer to her dad than  she was to her mom.  Pt was able to have a really good conversation with her dad on her birthday and she shares she will likely always remember that talk.  Pt is glad that Stacy Hallmark, and [name] are all going with pt to the visitation and memorial service on Monday.  Pt has been swimming as recently this morning and that is very good for her.  Pt shares that [name] is having an issue with a female student at school and administration is involved to take care of the situation.  Pt shares that she did go to [employer] for her birthday dinner and she bought herself a new pair of sneakers and a few other small gifts.  Hallmark has recently gotten his car back from the repair shop after his accident a few weeks ago.  He is going into his last semester at [employer] for his associates degree.  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  She continues to take her Monjauro and it is helping her; pt continues to take her Buspirone  and Cymbalta daily as well.  Pt has been watching favorite TV shows recently as well; she has been enjoying listening to music, cooking, and cleaning.  She has been talking with friends in [place] and she went to mass this morning too.  Encouraged pt to continue with  her self care activities and we will meet in 2 wks for a follow up session.    Interventions: Cognitive Behavioral Therapy  Diagnosis:Adjustment disorder with mixed anxiety and depressed mood  Plan: Treatment Plan Strengths/Abilities:  Intelligent, Intuitive, Willing to participate in therapy Treatment Preferences:  Outpatient Individual Therapy Statement of Needs:  Patient is to use CBT, mindfulness and coping skills to help manage and/or decrease symptoms associated with their diagnosis. Symptoms:  Depressed/Irritable mood, worry, social withdrawal Problems Addressed:  Depressive thoughts, Sadness, Sleep issues, etc. Long Term Goals:  Pt to reduce overall level, frequency, and intensity of the feelings of  depression/anxiety as evidenced by decreased irritability, negative self talk, and helpless feelings from 6 to 7 days/week to 0 to 1 days/week, per client report, for at least 3 consecutive months.  Progress: 30% Short Term Goals:  Pt to verbally express understanding of the relationship between feelings of depression/anxiety and their impact on thinking patterns and behaviors.  Pt to verbalize an understanding of the role that distorted thinking plays in creating fears, excessive worry, and ruminations.  Progress: 30% Target Date:  04/22/2025 Frequency:  Bi-weekly Modality:  Cognitive Behavioral Therapy Interventions by Therapist:  Therapist will use CBT, Mindfulness exercises, Coping skills and Referrals, as needed by client. Client has verbally approved this treatment plan.  Stacy Hull, [facility]

## 2024-09-09 ENCOUNTER — Ambulatory Visit: Admitting: Psychology

## 2024-10-18 ENCOUNTER — Other Ambulatory Visit: Payer: Self-pay | Admitting: Family Medicine

## 2024-10-18 DIAGNOSIS — F411 Generalized anxiety disorder: Secondary | ICD-10-CM

## 2024-10-18 NOTE — Telephone Encounter (Signed)
 Requested Prescriptions   Pending Prescriptions Disp Refills   ALPRAZolam  (XANAX ) 0.5 MG tablet [Pharmacy Med Name: ALPRAZOLAM  0.5MG  TABLETS] 30 tablet     Sig: TAKE 1 TABLET BY MOUTH DAILY AS NEEDED FOR ANXIETY     Date of patient request: 10/18/24 Last office visit: 07/14/2024 Upcoming visit: 01/12/2025 Date of last refill: 08/25/24 Last refill amount: 30

## 2024-10-18 NOTE — Telephone Encounter (Signed)
 Medication discussed at August 14 visit.  Controlled substance database reviewed.  #30 last filled on 08/25/2024.  Previously 03/29/2024.  Refill ordered.

## 2024-10-21 ENCOUNTER — Other Ambulatory Visit: Payer: Self-pay | Admitting: Family Medicine

## 2024-10-21 DIAGNOSIS — E119 Type 2 diabetes mellitus without complications: Secondary | ICD-10-CM

## 2024-10-21 NOTE — Telephone Encounter (Signed)
 Based on last A1c will continue same dose of Mounjaro .

## 2024-11-04 ENCOUNTER — Ambulatory Visit: Admitting: Psychology

## 2024-11-22 ENCOUNTER — Ambulatory Visit: Admitting: Psychology

## 2024-11-22 DIAGNOSIS — F4323 Adjustment disorder with mixed anxiety and depressed mood: Secondary | ICD-10-CM | POA: Diagnosis not present

## 2024-11-22 NOTE — Progress Notes (Signed)
 "  [facility] Counselor/Therapist Progress Note  Patient ID: Stacy Hull, MRN: [id number],    Date: 11/22/2024  Time Spent: 60 mins  start time: 1300     end time: 1400  Treatment Type: Individual Therapy  Reported Symptoms: Pt presents for session via Caregility video, granting consent for the session.  Pt states she is in her home with no one else present.  I shared with pt that I am in my office with no one else here either.  Many notes for this pt are still in Therapy Charts.  Mental Status Exam: Appearance:  Casual     Behavior: Appropriate  Motor: Normal  Speech/Language:  Clear and Coherent  Affect: Appropriate  Mood: normal  Thought process: normal  Thought content:   WNL  Sensory/Perceptual disturbances:   WNL  Orientation: oriented to person, place, and time/date  Attention: Good  Concentration: Good  Memory: WNL  Fund of knowledge:  Good  Insight:   Good  Judgment:  Good  Impulse Control: Good   Risk Assessment: Danger to Self:  No Self-injurious Behavior: No Danger to Others: No Duty to Warn:no Physical Aggression / Violence:No  Access to Firearms a concern: No  Gang Involvement:No   Notified of retirement  Subjective: Pt shares So much has happened since our last session (9/26).  My husband Stacy Hull) had gotten so much pressure to move his mom from [place] to here.  He ended up quitting his job at [employer] (12/5) and went to [place] to start trying to get his mom ready to move back here.  I have contacted an attorney about a possible separation.  When he quit his job, I sold his [place] and put the money in a savings account.  With the money that my dad left me, I was able to pay off the house, my car, and the credit cards.  I told him he needs to stay in [place].  I am done with the marriage.  Pt shares she is much more relaxed since Stacy Hull has been gone.  Stacy Hull still talks with Stacy Hull regularly and pt is fine with that.  Stacy Hull told pt that he and Stacy Hull did not want  him at the house anymore.  Stacy Hull has a boyfriend Stacy Hull) that pt approves of; Stacy Hull did not like the fact that she had a boyfriend.  Pt feels freedom to make this decision about her marriage since her dad has passed away.  Pt shares she feels no anxiety about Stacy Hull Being gone; she feels a greater sense of peace now.  Pt is planning to go to [place], [place] in June and is planning to take Stacy Hull with her; they will be there for 2 weeks and they will take day trips while they are there.  She lived in [place] with her parents when her dad was stationed there.  She has her own passport but is having to get one for [facility].  Stacy Hull is currently driving his mom's car in [place].  His mom just turned 35 yo; her house is paid off and he can live up there and get the house when she passes away.  His dad died 10 yrs ago; his parents divorced when [name] graduated from [employer].  Stacy Hull is in her sophomore year in HS at [employer]; she has lost about 40 pounds this semester while on her ADHD medication.  Stacy Hull is still at [employer] and has a few core classes that he has to take.  He is  working at [employer] now and he recently had a concert performance for the end of the semester at [employer] when he played and sang; pt was proud of his performance.  Pt has been swimming as recently this morning and that is very good for her.  She continues to take her Monjauro and it is helping her; pt continues to take her Buspirone  and Cymbalta daily as well.  Pt has been watching favorite TV shows recently as well; she has been enjoying listening to music, cooking, and cleaning.  She has been talking with friends in [place] and she went to mass this morning too.  Pt is trying to be intentional about enjoying the break from school.  She is working on her grieving process for the loss of her dad.  Encouraged pt to continue with her self care activities and we will meet in 2 wks for a follow up session.    Interventions: Cognitive Behavioral  Therapy  Diagnosis:Adjustment disorder with mixed anxiety and depressed mood  Plan: Treatment Plan Strengths/Abilities:  Intelligent, Intuitive, Willing to participate in therapy Treatment Preferences:  Outpatient Individual Therapy Statement of Needs:  Patient is to use CBT, mindfulness and coping skills to help manage and/or decrease symptoms associated with their diagnosis. Symptoms:  Depressed/Irritable mood, worry, social withdrawal Problems Addressed:  Depressive thoughts, Sadness, Sleep issues, etc. Long Term Goals:  Pt to reduce overall level, frequency, and intensity of the feelings of depression/anxiety as evidenced by decreased irritability, negative self talk, and helpless feelings from 6 to 7 days/week to 0 to 1 days/week, per client report, for at least 3 consecutive months.  Progress: 30% Short Term Goals:  Pt to verbally express understanding of the relationship between feelings of depression/anxiety and their impact on thinking patterns and behaviors.  Pt to verbalize an understanding of the role that distorted thinking plays in creating fears, excessive worry, and ruminations.  Progress: 30% Target Date:  04/22/2025 Frequency:  Bi-weekly Modality:  Cognitive Behavioral Therapy Interventions by Therapist:  Therapist will use CBT, Mindfulness exercises, Coping skills and Referrals, as needed by client. Client has verbally approved this treatment plan.  Stacy Hull, [facility] "

## 2024-11-25 ENCOUNTER — Telehealth: Payer: Self-pay

## 2024-11-25 ENCOUNTER — Other Ambulatory Visit (HOSPITAL_COMMUNITY): Payer: Self-pay

## 2024-11-25 NOTE — Telephone Encounter (Signed)
 Pharmacy Patient Advocate Encounter   Received notification from Onbase that prior authorization for Mounjaro  7.5MG /0.5ML auto-injectors is required/requested.   Insurance verification completed.   The patient is insured through [facility].   Per test claim: The current 84 day co-pay is, $25.00.  No PA needed at this time. This test claim was processed through [facility] copay amounts may vary at other pharmacies due to pharmacy/plan contracts, or as the patient moves through the different stages of their insurance plan.

## 2024-12-23 ENCOUNTER — Ambulatory Visit: Admitting: Psychology

## 2024-12-23 DIAGNOSIS — F4323 Adjustment disorder with mixed anxiety and depressed mood: Secondary | ICD-10-CM | POA: Diagnosis not present

## 2024-12-23 NOTE — Progress Notes (Signed)
 "  [facility] Counselor/Therapist Progress Note  Patient ID: Stacy Hull, MRN: [id number],    Date: 11/22/2024  Time Spent: 46 mins  start time: 1610     end time: 1656  Treatment Type: Individual Therapy  Reported Symptoms: Pt presents for session via Caregility video, granting consent for the session.  Pt states she is in her home with no one else present.  I shared with pt that I am in my office with no one else here either.  Many notes for this pt are still in Therapy Charts.  Mental Status Exam: Appearance:  Casual     Behavior: Appropriate  Motor: Normal  Speech/Language:  Clear and Coherent  Affect: Appropriate  Mood: normal  Thought process: normal  Thought content:   WNL  Sensory/Perceptual disturbances:   WNL  Orientation: oriented to person, place, and time/date  Attention: Good  Concentration: Good  Memory: WNL  Fund of knowledge:  Good  Insight:   Good  Judgment:  Good  Impulse Control: Good   Risk Assessment: Danger to Self:  No Self-injurious Behavior: No Danger to Others: No Duty to Warn:no Physical Aggression / Violence:No  Access to Firearms a concern: No  Gang Involvement:No   Notified of retirement  Subjective: Pt shares Christmas was good; [name] was still in [place] with his mom and that was fine.  He came back to [place] to get his stuff from the house last week and then went back to [place] to his mom's home.  He told [name] that the worst part about him leaving the house was him missing the dogs.  That upset [name] a lot.  [name] turned 52 on 12/05/24.  [name] will be 54 yo on 01/13/25.  Pt shares that she is trying to talk to [place] as little as possible.  They do not have a formal separation agreement and pt is planning to reach out to the her attorney about that.  Pt shares she feels much less stress since [name] is not in the home any more.  [name] still has not gotten a job; quit his job at [employer] on 11/04/24.  Pt went to [place] to see her friend,  Wendelyn, last week while [name] was in town getting his stuff out of the house; she had a great time.  Pt shares she continues to swim frequently and knows that is good for her.  Pt shares that she really loves [name] and [name] and they are very supportive of her.  Pt is planning to go to [place], [place] in June and is planning to take [name] with her; they will be there for 2 weeks and they will take day trips while they are there.  [name] is in her sophomore year in HS at [employer].  [name] is still at [employer] and has a few core classes that he has to take.  He is working at [employer].  Pt continues to take her Monjauro and it is helping her; pt continues to take her Buspirone  and Cymbalta daily as well.  Pt has been watching favorite TV shows recently as well; she has been enjoying listening to music, cooking, and cleaning.  She is working on her grieving process for the loss of her dad.  Encouraged pt to continue with her self care activities and we will meet in 2 wks for a follow up session.    Interventions: Cognitive Behavioral Therapy  Diagnosis:Adjustment disorder with mixed anxiety and depressed mood  Plan:  Treatment Plan Strengths/Abilities:  Intelligent, Intuitive, Willing to participate in therapy Treatment Preferences:  Outpatient Individual Therapy Statement of Needs:  Patient is to use CBT, mindfulness and coping skills to help manage and/or decrease symptoms associated with their diagnosis. Symptoms:  Depressed/Irritable mood, worry, social withdrawal Problems Addressed:  Depressive thoughts, Sadness, Sleep issues, etc. Long Term Goals:  Pt to reduce overall level, frequency, and intensity of the feelings of depression/anxiety as evidenced by decreased irritability, negative self talk, and helpless feelings from 6 to 7 days/week to 0 to 1 days/week, per client report, for at least 3 consecutive months.  Progress: 30% Short Term Goals:  Pt to verbally express understanding of the  relationship between feelings of depression/anxiety and their impact on thinking patterns and behaviors.  Pt to verbalize an understanding of the role that distorted thinking plays in creating fears, excessive worry, and ruminations.  Progress: 30% Target Date:  04/22/2025 Frequency:  Bi-weekly Modality:  Cognitive Behavioral Therapy Interventions by Therapist:  Therapist will use CBT, Mindfulness exercises, Coping skills and Referrals, as needed by client. Client has verbally approved this treatment plan.  [name], [facility] "

## 2025-01-04 ENCOUNTER — Ambulatory Visit: Admitting: Psychology

## 2025-01-04 DIAGNOSIS — F4323 Adjustment disorder with mixed anxiety and depressed mood: Secondary | ICD-10-CM
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
## 2025-01-12 ENCOUNTER — Encounter: Admitting: Family Medicine

## 2025-01-17 ENCOUNTER — Ambulatory Visit: Admitting: Psychology
